# Patient Record
Sex: Male | Born: 1935
Health system: Southern US, Community
[De-identification: ages and names within clinical notes are randomized; demographics above are authoritative.]

## PROBLEM LIST (undated history)

## (undated) DIAGNOSIS — E119 Type 2 diabetes mellitus without complications: Secondary | ICD-10-CM

---

## 2016-04-05 ENCOUNTER — Emergency Department (HOSPITAL_COMMUNITY): Payer: Medicare Other

## 2016-04-05 ENCOUNTER — Emergency Department (HOSPITAL_COMMUNITY)
Admission: EM | Admit: 2016-04-05 | Discharge: 2016-04-05 | Disposition: A | Discharge status: Home / Self Care | Payer: Medicare Other | Attending: Emergency Medicine | Admitting: Emergency Medicine

## 2016-04-05 ENCOUNTER — Encounter (HOSPITAL_COMMUNITY): Payer: Self-pay | Admitting: Emergency Medicine

## 2016-04-05 DIAGNOSIS — Y929 Unspecified place or not applicable: Secondary | ICD-10-CM | POA: Diagnosis not present

## 2016-04-05 DIAGNOSIS — Y999 Unspecified external cause status: Secondary | ICD-10-CM | POA: Insufficient documentation

## 2016-04-05 DIAGNOSIS — X503XXA Overexertion from repetitive movements, initial encounter: Secondary | ICD-10-CM | POA: Insufficient documentation

## 2016-04-05 DIAGNOSIS — S161XXA Strain of muscle, fascia and tendon at neck level, initial encounter: Secondary | ICD-10-CM | POA: Diagnosis not present

## 2016-04-05 DIAGNOSIS — M40209 Unspecified kyphosis, site unspecified: Secondary | ICD-10-CM | POA: Diagnosis not present

## 2016-04-05 DIAGNOSIS — Y93H2 Activity, gardening and landscaping: Secondary | ICD-10-CM | POA: Diagnosis not present

## 2016-04-05 DIAGNOSIS — E119 Type 2 diabetes mellitus without complications: Secondary | ICD-10-CM | POA: Insufficient documentation

## 2016-04-05 DIAGNOSIS — S199XXA Unspecified injury of neck, initial encounter: Secondary | ICD-10-CM | POA: Diagnosis present

## 2016-04-05 HISTORY — DX: Type 2 diabetes mellitus without complications: E11.9

## 2016-04-05 LAB — CBG MONITORING, ED: GLUCOSE-CAPILLARY: 115 mg/dL — AB (ref 65–99)

## 2016-04-05 MED ORDER — NAPROXEN 500 MG PO TABS: 500.0000 mg | ORAL_TABLET | Freq: Two times a day (BID) | ORAL | Status: DC

## 2016-04-05 MED ORDER — IBUPROFEN 200 MG PO TABS
400.0000 mg | ORAL_TABLET | Freq: Once | ORAL | Status: AC | PRN
  Administered 2016-04-05: 400 mg via ORAL
  Filled 2016-04-05: qty 2

## 2016-04-05 MED ORDER — BUPIVACAINE HCL (PF) 0.5 % IJ SOLN
20.0000 mL | Freq: Once | INTRAMUSCULAR | Status: AC
  Administered 2016-04-05: 20 mL
  Filled 2016-04-05: qty 30

## 2016-04-05 NOTE — ED Notes (Signed)
Provider in room  

## 2016-04-05 NOTE — ED Notes (Addendum)
Neck pain x 10-12 days, non traumatic, started on the right side and has now moved to the left side. Unable to get into PCP. Has not taken any meds for this at home. Denies fever/vomiting

## 2016-04-05 NOTE — ED Notes (Signed)
Pt in ct 

## 2016-04-05 NOTE — ED Provider Notes (Signed)
CSN: [id number]     Arrival date & time 04/05/16  1453 History   First MD Initiated Contact with Patient 04/05/16 1906     Chief Complaint  Patient presents with  . Neck Pain     (Consider location/radiation/quality/duration/timing/severity/associated sxs/prior Treatment) Patient is a 82 y.o. male presenting with neck injury. The history is provided by the patient.  Neck Injury This is a new problem. The current episode started more than 1 week ago. The problem occurs constantly. The problem has not changed since onset.Associated symptoms include headaches. Pertinent negatives include no chest pain and no abdominal pain. Nothing aggravates the symptoms. Nothing relieves the symptoms. He has tried nothing for the symptoms.    Past Medical History  Diagnosis Date  . Diabetes mellitus without complication (HCC)    History reviewed. No pertinent past surgical history. History reviewed. No pertinent family history. Social History  Substance Use Topics  . Smoking status: None  . Smokeless tobacco: None  . Alcohol Use: None    Review of Systems  Cardiovascular: Negative for chest pain.  Gastrointestinal: Negative for abdominal pain.  Neurological: Positive for headaches.  All other systems reviewed and are negative.     Allergies  Review of patient's allergies indicates no known allergies.  Home Medications   Prior to Admission medications   Medication Sig Start Date End Date Taking? Authorizing Provider  naproxen (NAPROSYN) 500 MG tablet Take 1 tablet (500 mg total) by mouth 2 (two) times daily with a meal. 04/05/16   Lyndal Pulley, MD   BP 127/77 mmHg  Pulse 87  Temp(Src) 99.8 F (37.7 C) (Oral)  Resp 18  SpO2 97% Physical Exam  Constitutional: He is oriented to person, place, and time. He appears well-developed and well-nourished. No distress.  HENT:  Head: Normocephalic and atraumatic.  Eyes: Conjunctivae are normal.  Neck: Neck supple. No tracheal deviation  present.  Cardiovascular: Normal rate and regular rhythm.   Pulmonary/Chest: Effort normal. No respiratory distress.  Abdominal: Soft. He exhibits no distension.  Musculoskeletal:       Cervical back: He exhibits tenderness (over bilateral poraspinal musculature especially superiorly where spasm is evident).  Significant thoracic kyphosis  Neurological: He is alert and oriented to person, place, and time. He has normal strength. He displays no atrophy. No sensory deficit. He exhibits normal muscle tone. Coordination normal.  Skin: Skin is warm and dry.  Psychiatric: He has a normal mood and affect.    ED Course  Procedures (including critical care time)  Procedure Note: Trigger Point Injection for Myofascial pain  Performed by Dr. Clydene Pugh Indication: muscle/myofascial pain Muscle body and tendon sheath of the bilateral cervical paraspinal muscle(s) at the base of the skull were injected with 0.5% bupivacaine under sterile technique for release of muscle spasm/pain. Patient tolerated well with immediate improvement of symptoms and no immediate complications following procedure.  CPT Code:   1 or 2 muscle bodies: 20552  Labs Review Labs Reviewed  CBG MONITORING, ED - Abnormal; Notable for the following:    Glucose-Capillary 115 (*)    All other components within normal limits    Imaging Review Dg Cervical Spine Complete  04/05/2016  CLINICAL DATA:  82 year old with neck pain for 10 or 12 days. No acute injury. No fever. EXAM: CERVICAL SPINE - COMPLETE 4+ VIEW COMPARISON:  None. FINDINGS: The prevertebral soft tissues are normal. The bones are mildly demineralized. There is multilevel spondylosis with disc space loss, uncinate spurring and facet hypertrophy. There is a  mild degenerative anterolisthesis at C4-5, C5-6 and C6-7. No evidence of acute fracture or traumatic subluxation. Oblique views demonstrate no high-grade foraminal narrowing. IMPRESSION: Moderate multilevel spondylosis  with near anatomic alignment. No acute osseous findings. Electronically Signed   By: Carey BullocksWilliam  Veazey M.D.   On: 04/05/2016 19:50   I have personally reviewed and evaluated these images and lab results as part of my medical decision-making.   EKG Interpretation None      MDM   Final diagnoses:  Cervical muscle strain, initial encounter  Kyphosis, unspecified kyphosis type, unspecified spinal region    82 y.o. male presents with bilateral neck pain mostly at the base of his skull. He has significant clinical kyphosis and per family often carries things on his shoulders for gardening. Point tender over splenius capitus muscles which are palpably in spasm. Screening XR without bony involvement, appears to be muscular. No neurologic deficits. Trigger point injections applied with come relief, recommended short-term NSAID therapy and posture maintenance as definitive therapy. Plan to follow up with PCP as needed and return precautions discussed for worsening or new concerning symptoms.     Lyndal Pulleyaniel Norval Slaven, MD 04/06/16 [phone]

## 2016-04-05 NOTE — Discharge Instructions (Signed)
Cervical Strain and Sprain With Rehab  Cervical strain and sprain are injuries that commonly occur with "whiplash" injuries. Whiplash occurs when the neck is forcefully whipped backward or forward, such as during a motor vehicle accident or during contact sports. The muscles, ligaments, tendons, discs, and nerves of the neck are susceptible to injury when this occurs.  RISK FACTORS  Risk of having a whiplash injury increases if:  · Osteoarthritis of the spine.  · Situations that make head or neck accidents or trauma more likely.  · High-risk sports (football, rugby, wrestling, hockey, auto racing, gymnastics, diving, contact karate, or boxing).  · Poor strength and flexibility of the neck.  · Previous neck injury.  · Poor tackling technique.  · Improperly fitted or padded equipment.  SYMPTOMS   · Pain or stiffness in the front or back of neck or both.  · Symptoms may present immediately or up to 24 hours after injury.  · Dizziness, headache, nausea, and vomiting.  · Muscle spasm with soreness and stiffness in the neck.  · Tenderness and swelling at the injury site.  PREVENTION  · Learn and use proper technique (avoid tackling with the head, spearing, and head-butting; use proper falling techniques to avoid landing on the head).  · Warm up and stretch properly before activity.  · Maintain physical fitness:    Strength, flexibility, and endurance.    Cardiovascular fitness.  · Wear properly fitted and padded protective equipment, such as padded soft collars, for participation in contact sports.  PROGNOSIS   Recovery from cervical strain and sprain injuries is dependent on the extent of the injury. These injuries are usually curable in 1 week to 3 months with appropriate treatment.   RELATED COMPLICATIONS   · Temporary numbness and weakness may occur if the nerve roots are damaged, and this may persist until the nerve has completely healed.  · Chronic pain due to frequent recurrence of symptoms.  · Prolonged healing,  especially if activity is resumed too soon (before complete recovery).  TREATMENT   Treatment initially involves the use of ice and medication to help reduce pain and inflammation. It is also important to perform strengthening and stretching exercises and modify activities that worsen symptoms so the injury does not get worse. These exercises may be performed at home or with a therapist. For patients who experience severe symptoms, a soft, padded collar may be recommended to be worn around the neck.   Improving your posture may help reduce symptoms. Posture improvement includes pulling your chin and abdomen in while sitting or standing. If you are sitting, sit in a firm chair with your buttocks against the back of the chair. While sleeping, try replacing your pillow with a small towel rolled to 2 inches in diameter, or use a cervical pillow or soft cervical collar. Poor sleeping positions delay healing.   For patients with nerve root damage, which causes numbness or weakness, the use of a cervical traction apparatus may be recommended. Surgery is rarely necessary for these injuries. However, cervical strain and sprains that are present at birth (congenital) may require surgery.  MEDICATION   · If pain medication is necessary, nonsteroidal anti-inflammatory medications, such as aspirin and ibuprofen, or other minor pain relievers, such as acetaminophen, are often recommended.  · Do not take pain medication for 7 days before surgery.  · Prescription pain relievers may be given if deemed necessary by your caregiver. Use only as directed and only as much as you need.    HEAT AND COLD:   · Cold treatment (icing) relieves pain and reduces inflammation. Cold treatment should be applied for 10 to 15 minutes every 2 to 3 hours for inflammation and pain and immediately after any activity that aggravates your symptoms. Use ice packs or an ice massage.  · Heat treatment may be used prior to performing the stretching and  strengthening activities prescribed by your caregiver, physical therapist, or athletic trainer. Use a heat pack or a warm soak.  SEEK MEDICAL CARE IF:   · Symptoms get worse or do not improve in 2 weeks despite treatment.  · New, unexplained symptoms develop (drugs used in treatment may produce side effects).  EXERCISES  RANGE OF MOTION (ROM) AND STRETCHING EXERCISES - Cervical Strain and Sprain  These exercises may help you when beginning to rehabilitate your injury. In order to successfully resolve your symptoms, you must improve your posture. These exercises are designed to help reduce the forward-head and rounded-shoulder posture which contributes to this condition. Your symptoms may resolve with or without further involvement from your physician, physical therapist or athletic trainer. While completing these exercises, remember:   · Restoring tissue flexibility helps normal motion to return to the joints. This allows healthier, less painful movement and activity.  · An effective stretch should be held for at least 20 seconds, although you may need to begin with shorter hold times for comfort.  · A stretch should never be painful. You should only feel a gentle lengthening or release in the stretched tissue.  STRETCH- Axial Extensors  · Lie on your back on the floor. You may bend your knees for comfort. Place a rolled-up hand towel or dish towel, about 2 inches in diameter, under the part of your head that makes contact with the floor.  · Gently tuck your chin, as if trying to make a "double chin," until you feel a gentle stretch at the base of your head.  · Hold __________ seconds.  Repeat __________ times. Complete this exercise __________ times per day.   STRETCH - Axial Extension   · Stand or sit on a firm surface. Assume a good posture: chest up, shoulders drawn back, abdominal muscles slightly tense, knees unlocked (if standing) and feet hip width apart.  · Slowly retract your chin so your head slides back  and your chin slightly lowers. Continue to look straight ahead.  · You should feel a gentle stretch in the back of your head. Be certain not to feel an aggressive stretch since this can cause headaches later.  · Hold for __________ seconds.  Repeat __________ times. Complete this exercise __________ times per day.  STRETCH - Cervical Side Bend   · Stand or sit on a firm surface. Assume a good posture: chest up, shoulders drawn back, abdominal muscles slightly tense, knees unlocked (if standing) and feet hip width apart.  · Without letting your nose or shoulders move, slowly tip your right / left ear to your shoulder until your feel a gentle stretch in the muscles on the opposite side of your neck.  · Hold __________ seconds.  Repeat __________ times. Complete this exercise __________ times per day.  STRETCH - Cervical Rotators   · Stand or sit on a firm surface. Assume a good posture: chest up, shoulders drawn back, abdominal muscles slightly tense, knees unlocked (if standing) and feet hip width apart.  · Keeping your eyes level with the ground, slowly turn your head until you feel a gentle stretch along   the back and opposite side of your neck.  · Hold __________ seconds.  Repeat __________ times. Complete this exercise __________ times per day.  RANGE OF MOTION - Neck Circles   · Stand or sit on a firm surface. Assume a good posture: chest up, shoulders drawn back, abdominal muscles slightly tense, knees unlocked (if standing) and feet hip width apart.  · Gently roll your head down and around from the back of one shoulder to the back of the other. The motion should never be forced or painful.  · Repeat the motion 10-20 times, or until you feel the neck muscles relax and loosen.  Repeat __________ times. Complete the exercise __________ times per day.  STRENGTHENING EXERCISES - Cervical Strain and Sprain  These exercises may help you when beginning to rehabilitate your injury. They may resolve your symptoms with or  without further involvement from your physician, physical therapist, or athletic trainer. While completing these exercises, remember:   · Muscles can gain both the endurance and the strength needed for everyday activities through controlled exercises.  · Complete these exercises as instructed by your physician, physical therapist, or athletic trainer. Progress the resistance and repetitions only as guided.  · You may experience muscle soreness or fatigue, but the pain or discomfort you are trying to eliminate should never worsen during these exercises. If this pain does worsen, stop and make certain you are following the directions exactly. If the pain is still present after adjustments, discontinue the exercise until you can discuss the trouble with your clinician.  STRENGTH - Cervical Flexors, Isometric  · Face a wall, standing about 6 inches away. Place a small pillow, a ball about 6-8 inches in diameter, or a folded towel between your forehead and the wall.  · Slightly tuck your chin and gently push your forehead into the soft object. Push only with mild to moderate intensity, building up tension gradually. Keep your jaw and forehead relaxed.  · Hold 10 to 20 seconds. Keep your breathing relaxed.  · Release the tension slowly. Relax your neck muscles completely before you start the next repetition.  Repeat __________ times. Complete this exercise __________ times per day.  STRENGTH- Cervical Lateral Flexors, Isometric   · Stand about 6 inches away from a wall. Place a small pillow, a ball about 6-8 inches in diameter, or a folded towel between the side of your head and the wall.  · Slightly tuck your chin and gently tilt your head into the soft object. Push only with mild to moderate intensity, building up tension gradually. Keep your jaw and forehead relaxed.  · Hold 10 to 20 seconds. Keep your breathing relaxed.  · Release the tension slowly. Relax your neck muscles completely before you start the next  repetition.  Repeat __________ times. Complete this exercise __________ times per day.  STRENGTH - Cervical Extensors, Isometric   · Stand about 6 inches away from a wall. Place a small pillow, a ball about 6-8 inches in diameter, or a folded towel between the back of your head and the wall.  · Slightly tuck your chin and gently tilt your head back into the soft object. Push only with mild to moderate intensity, building up tension gradually. Keep your jaw and forehead relaxed.  · Hold 10 to 20 seconds. Keep your breathing relaxed.  · Release the tension slowly. Relax your neck muscles completely before you start the next repetition.  Repeat __________ times. Complete this exercise __________ times per day.    POSTURE AND BODY MECHANICS CONSIDERATIONS - Cervical Strain and Sprain  Keeping correct posture when sitting, standing or completing your activities will reduce the stress put on different body tissues, allowing injured tissues a chance to heal and limiting painful experiences. The following are general guidelines for improved posture. Your physician or physical therapist will provide you with any instructions specific to your needs. While reading these guidelines, remember:  · The exercises prescribed by your provider will help you have the flexibility and strength to maintain correct postures.  · The correct posture provides the optimal environment for your joints to work. All of your joints have less wear and tear when properly supported by a spine with good posture. This means you will experience a healthier, less painful body.  · Correct posture must be practiced with all of your activities, especially prolonged sitting and standing. Correct posture is as important when doing repetitive low-stress activities (typing) as it is when doing a single heavy-load activity (lifting).  PROLONGED STANDING WHILE SLIGHTLY LEANING FORWARD  When completing a task that requires you to lean forward while standing in one  place for a long time, place either foot up on a stationary 2- to 4-inch high object to help maintain the best posture. When both feet are on the ground, the low back tends to lose its slight inward curve. If this curve flattens (or becomes too large), then the back and your other joints will experience too much stress, fatigue more quickly, and can cause pain.   RESTING POSITIONS  Consider which positions are most painful for you when choosing a resting position. If you have pain with flexion-based activities (sitting, bending, stooping, squatting), choose a position that allows you to rest in a less flexed posture. You would want to avoid curling into a fetal position on your side. If your pain worsens with extension-based activities (prolonged standing, working overhead), avoid resting in an extended position such as sleeping on your stomach. Most people will find more comfort when they rest with their spine in a more neutral position, neither too rounded nor too arched. Lying on a non-sagging bed on your side with a pillow between your knees, or on your back with a pillow under your knees will often provide some relief. Keep in mind, being in any one position for a prolonged period of time, no matter how correct your posture, can still lead to stiffness.  WALKING  Walk with an upright posture. Your ears, shoulders, and hips should all line up.  OFFICE WORK  When working at a desk, create an environment that supports good, upright posture. Without extra support, muscles fatigue and lead to excessive strain on joints and other tissues.  CHAIR:  · A chair should be able to slide under your desk when your back makes contact with the back of the chair. This allows you to work closely.  · The chair's height should allow your eyes to be level with the upper part of your monitor and your hands to be slightly lower than your elbows.  · Body position:    Your feet should make contact with the floor. If this is not  possible, use a foot rest.    Keep your ears over your shoulders. This will reduce stress on your neck and low back.     This information is not intended to replace advice given to you by your health care provider. Make sure you discuss any questions you have with your health care provider.       Document Released: 11/29/2005 Document Revised: 12/20/2014 Document Reviewed: 03/13/2009  Elsevier Interactive Patient Education ©2016 [employer]

## 2020-08-27 DIAGNOSIS — Z0000 Encounter for general adult medical examination without abnormal findings: Secondary | ICD-10-CM | POA: Diagnosis not present

## 2020-08-27 DIAGNOSIS — E1165 Type 2 diabetes mellitus with hyperglycemia: Secondary | ICD-10-CM | POA: Diagnosis not present

## 2020-08-27 DIAGNOSIS — E11319 Type 2 diabetes mellitus with unspecified diabetic retinopathy without macular edema: Secondary | ICD-10-CM | POA: Diagnosis not present

## 2020-08-27 DIAGNOSIS — E1142 Type 2 diabetes mellitus with diabetic polyneuropathy: Secondary | ICD-10-CM | POA: Diagnosis not present

## 2020-08-27 DIAGNOSIS — E46 Unspecified protein-calorie malnutrition: Secondary | ICD-10-CM | POA: Diagnosis not present

## 2020-08-27 DIAGNOSIS — Z7984 Long term (current) use of oral hypoglycemic drugs: Secondary | ICD-10-CM | POA: Diagnosis not present

## 2020-08-27 DIAGNOSIS — Z1389 Encounter for screening for other disorder: Secondary | ICD-10-CM | POA: Diagnosis not present

## 2020-09-04 DIAGNOSIS — Z7984 Long term (current) use of oral hypoglycemic drugs: Secondary | ICD-10-CM | POA: Diagnosis not present

## 2020-09-04 DIAGNOSIS — E1142 Type 2 diabetes mellitus with diabetic polyneuropathy: Secondary | ICD-10-CM | POA: Diagnosis not present

## 2021-01-12 ENCOUNTER — Ambulatory Visit: Payer: Self-pay | Attending: Internal Medicine

## 2021-01-12 DIAGNOSIS — Z23 Encounter for immunization: Secondary | ICD-10-CM

## 2021-01-12 NOTE — Progress Notes (Signed)
   Covid-19 Vaccination Clinic  Name:  David Cervantes    MRN: [id number] DOB: 13-Sep-1927  01/12/2021  [name] was observed post Covid-19 immunization for 15 minutes without incident. He was provided with Vaccine Information Sheet and instruction to access the V-Safe system.   [name] was instructed to call 911 with any severe reactions post vaccine: Marland Kitchen Difficulty breathing  . Swelling of face and throat  . A fast heartbeat  . A bad rash all over body  . Dizziness and weakness   Immunizations Administered    Name Date Dose VIS Date Route   PFIZER Comrnaty(Gray TOP) Covid-19 Vaccine 01/12/2021  2:47 PM 0.3 mL 11/20/2020 Intramuscular   Manufacturer: [employer], [employer]   Lot: [id number]   NDC: [phone]

## 2021-01-14 DIAGNOSIS — H524 Presbyopia: Secondary | ICD-10-CM | POA: Diagnosis not present

## 2021-01-14 DIAGNOSIS — E119 Type 2 diabetes mellitus without complications: Secondary | ICD-10-CM | POA: Diagnosis not present

## 2021-01-14 DIAGNOSIS — H35373 Puckering of macula, bilateral: Secondary | ICD-10-CM | POA: Diagnosis not present

## 2021-01-14 DIAGNOSIS — H26491 Other secondary cataract, right eye: Secondary | ICD-10-CM | POA: Diagnosis not present

## 2021-03-03 DIAGNOSIS — E782 Mixed hyperlipidemia: Secondary | ICD-10-CM | POA: Diagnosis not present

## 2021-03-03 DIAGNOSIS — E113293 Type 2 diabetes mellitus with mild nonproliferative diabetic retinopathy without macular edema, bilateral: Secondary | ICD-10-CM | POA: Diagnosis not present

## 2021-03-03 DIAGNOSIS — E1142 Type 2 diabetes mellitus with diabetic polyneuropathy: Secondary | ICD-10-CM | POA: Diagnosis not present

## 2021-07-03 ENCOUNTER — Ambulatory Visit: Payer: Self-pay | Attending: Internal Medicine

## 2021-07-03 DIAGNOSIS — Z23 Encounter for immunization: Secondary | ICD-10-CM

## 2021-07-06 ENCOUNTER — Other Ambulatory Visit: Payer: Self-pay

## 2021-07-06 MED ORDER — PFIZER-BIONT COVID-19 VAC-TRIS 30 MCG/0.3ML IM SUSP
INTRAMUSCULAR | 0 refills | Status: DC
  Filled 2021-07-06: qty 0.3, 10d supply, fill #0

## 2021-09-07 DIAGNOSIS — L309 Dermatitis, unspecified: Secondary | ICD-10-CM | POA: Diagnosis not present

## 2021-09-07 DIAGNOSIS — Z0000 Encounter for general adult medical examination without abnormal findings: Secondary | ICD-10-CM | POA: Diagnosis not present

## 2021-09-07 DIAGNOSIS — E1165 Type 2 diabetes mellitus with hyperglycemia: Secondary | ICD-10-CM | POA: Diagnosis not present

## 2021-09-07 DIAGNOSIS — E46 Unspecified protein-calorie malnutrition: Secondary | ICD-10-CM | POA: Diagnosis not present

## 2021-09-07 DIAGNOSIS — E1142 Type 2 diabetes mellitus with diabetic polyneuropathy: Secondary | ICD-10-CM | POA: Diagnosis not present

## 2021-09-07 DIAGNOSIS — E113299 Type 2 diabetes mellitus with mild nonproliferative diabetic retinopathy without macular edema, unspecified eye: Secondary | ICD-10-CM | POA: Diagnosis not present

## 2021-09-07 DIAGNOSIS — Z794 Long term (current) use of insulin: Secondary | ICD-10-CM | POA: Diagnosis not present

## 2021-09-07 DIAGNOSIS — Z1389 Encounter for screening for other disorder: Secondary | ICD-10-CM | POA: Diagnosis not present

## 2021-09-07 DIAGNOSIS — E782 Mixed hyperlipidemia: Secondary | ICD-10-CM | POA: Diagnosis not present

## 2021-09-07 DIAGNOSIS — R001 Bradycardia, unspecified: Secondary | ICD-10-CM | POA: Diagnosis not present

## 2021-10-20 ENCOUNTER — Ambulatory Visit: Payer: Self-pay | Attending: Internal Medicine

## 2021-10-20 ENCOUNTER — Other Ambulatory Visit: Payer: Self-pay

## 2021-10-20 DIAGNOSIS — Z23 Encounter for immunization: Secondary | ICD-10-CM

## 2021-10-20 MED ORDER — PFIZER COVID-19 VAC BIVALENT 30 MCG/0.3ML IM SUSP
INTRAMUSCULAR | 0 refills | Status: DC
  Filled 2021-10-20: qty 0.3, 1d supply, fill #0

## 2021-10-20 NOTE — Progress Notes (Signed)
   Covid-19 Vaccination Clinic  Name:  Rayansh Herbst    MRN: [id number] DOB: [birth year]  10/20/2021  Mr. Dehoyos was observed post Covid-19 immunization for 15 minutes without incident. He was provided with Vaccine Information Sheet and instruction to access the V-Safe system.   Mr. Bledsoe was instructed to call 911 with any severe reactions post vaccine: Difficulty breathing  Swelling of face and throat  A fast heartbeat  A bad rash all over body  Dizziness and weakness   Immunizations Administered     Name Date Dose VIS Date Route   Pfizer Covid-19 Vaccine Bivalent Booster 10/20/2021  2:21 PM 0.3 mL 08/12/2021 Intramuscular   Manufacturer: [employer], [employer]   Lot: [id number]   NDC: [address]-3      Drusilla Kanner, PharmD, MBA Clinical Acute Care Pharmacist

## 2022-01-07 DIAGNOSIS — F0390 Unspecified dementia without behavioral disturbance: Secondary | ICD-10-CM | POA: Diagnosis not present

## 2022-01-07 DIAGNOSIS — E113293 Type 2 diabetes mellitus with mild nonproliferative diabetic retinopathy without macular edema, bilateral: Secondary | ICD-10-CM | POA: Diagnosis not present

## 2022-01-07 DIAGNOSIS — E46 Unspecified protein-calorie malnutrition: Secondary | ICD-10-CM | POA: Diagnosis not present

## 2022-01-07 DIAGNOSIS — E1165 Type 2 diabetes mellitus with hyperglycemia: Secondary | ICD-10-CM | POA: Diagnosis not present

## 2022-01-07 DIAGNOSIS — E1142 Type 2 diabetes mellitus with diabetic polyneuropathy: Secondary | ICD-10-CM | POA: Diagnosis not present

## 2022-01-18 DIAGNOSIS — H35373 Puckering of macula, bilateral: Secondary | ICD-10-CM | POA: Diagnosis not present

## 2022-01-18 DIAGNOSIS — H26491 Other secondary cataract, right eye: Secondary | ICD-10-CM | POA: Diagnosis not present

## 2022-01-18 DIAGNOSIS — E119 Type 2 diabetes mellitus without complications: Secondary | ICD-10-CM | POA: Diagnosis not present

## 2022-01-18 DIAGNOSIS — H524 Presbyopia: Secondary | ICD-10-CM | POA: Diagnosis not present

## 2022-03-25 ENCOUNTER — Other Ambulatory Visit: Payer: Self-pay

## 2022-03-25 ENCOUNTER — Emergency Department (HOSPITAL_COMMUNITY): Payer: Medicare PPO

## 2022-03-25 ENCOUNTER — Emergency Department (HOSPITAL_COMMUNITY)
Admission: EM | Admit: 2022-03-25 | Discharge: 2022-03-25 | Disposition: A | Discharge status: Home / Self Care | Payer: Medicare PPO | Attending: Emergency Medicine | Admitting: Emergency Medicine

## 2022-03-25 ENCOUNTER — Encounter (HOSPITAL_COMMUNITY): Payer: Self-pay

## 2022-03-25 DIAGNOSIS — R4182 Altered mental status, unspecified: Secondary | ICD-10-CM | POA: Insufficient documentation

## 2022-03-25 DIAGNOSIS — R531 Weakness: Secondary | ICD-10-CM | POA: Diagnosis not present

## 2022-03-25 DIAGNOSIS — Z7984 Long term (current) use of oral hypoglycemic drugs: Secondary | ICD-10-CM | POA: Diagnosis not present

## 2022-03-25 DIAGNOSIS — R001 Bradycardia, unspecified: Secondary | ICD-10-CM | POA: Diagnosis not present

## 2022-03-25 DIAGNOSIS — Z8616 Personal history of COVID-19: Secondary | ICD-10-CM | POA: Insufficient documentation

## 2022-03-25 DIAGNOSIS — E161 Other hypoglycemia: Secondary | ICD-10-CM | POA: Diagnosis not present

## 2022-03-25 DIAGNOSIS — E162 Hypoglycemia, unspecified: Secondary | ICD-10-CM | POA: Insufficient documentation

## 2022-03-25 DIAGNOSIS — J90 Pleural effusion, not elsewhere classified: Secondary | ICD-10-CM | POA: Diagnosis not present

## 2022-03-25 DIAGNOSIS — R0902 Hypoxemia: Secondary | ICD-10-CM | POA: Diagnosis not present

## 2022-03-25 DIAGNOSIS — E11649 Type 2 diabetes mellitus with hypoglycemia without coma: Secondary | ICD-10-CM | POA: Diagnosis not present

## 2022-03-25 DIAGNOSIS — I10 Essential (primary) hypertension: Secondary | ICD-10-CM | POA: Diagnosis not present

## 2022-03-25 LAB — CBG MONITORING, ED
Glucose-Capillary: 65 mg/dL — ABNORMAL LOW (ref 70–99)
Glucose-Capillary: 104 mg/dL — ABNORMAL HIGH (ref 70–99)
Glucose-Capillary: 186 mg/dL — ABNORMAL HIGH (ref 70–99)

## 2022-03-25 LAB — COMPREHENSIVE METABOLIC PANEL
ALT: 30 U/L (ref 0–44)
AST: 39 U/L (ref 15–41)
Albumin: 3.4 g/dL — ABNORMAL LOW (ref 3.5–5.0)
Alkaline Phosphatase: 112 U/L (ref 38–126)
Anion gap: 6 (ref 5–15)
BUN: 19 mg/dL (ref 8–23)
CO2: 26 mmol/L (ref 22–32)
Calcium: 8.7 mg/dL — ABNORMAL LOW (ref 8.9–10.3)
Chloride: 110 mmol/L (ref 98–111)
Creatinine, Ser: 0.9 mg/dL (ref 0.61–1.24)
GFR, Estimated: >60 mL/min (ref >60)
Glucose, Bld: 81 mg/dL (ref 70–99)
Potassium: 3.5 mmol/L (ref 3.5–5.1)
Sodium: 142 mmol/L (ref 135–145)
Total Bilirubin: 0.5 mg/dL (ref 0.3–1.2)
Total Protein: 6.7 g/dL (ref 6.5–8.1)

## 2022-03-25 LAB — CBC WITH DIFFERENTIAL/PLATELET
Abs Immature Granulocytes: 0.07 10*3/uL (ref 0.00–0.07)
Basophils Absolute: 0 10*3/uL (ref 0.0–0.1)
Basophils Relative: 0 %
Eosinophils Absolute: 0 10*3/uL (ref 0.0–0.5)
Eosinophils Relative: 0 %
HCT: 35.7 % — ABNORMAL LOW (ref 39.0–52.0)
Hemoglobin: 12.1 g/dL — ABNORMAL LOW (ref 13.0–17.0)
Immature Granulocytes: 1 %
Lymphocytes Relative: 5 %
Lymphs Abs: 0.7 10*3/uL (ref 0.7–4.0)
MCH: 31.8 pg (ref 26.0–34.0)
MCHC: 33.9 g/dL (ref 30.0–36.0)
MCV: 93.7 fL (ref 80.0–100.0)
Monocytes Absolute: 0.9 10*3/uL (ref 0.1–1.0)
Monocytes Relative: 7 %
Neutro Abs: 11.1 10*3/uL — ABNORMAL HIGH (ref 1.7–7.7)
Neutrophils Relative %: 87 %
Platelets: 282 10*3/uL (ref 150–400)
RBC: 3.81 MIL/uL — ABNORMAL LOW (ref 4.22–5.81)
RDW: 13.5 % (ref 11.5–15.5)
WBC: 12.8 10*3/uL — ABNORMAL HIGH (ref 4.0–10.5)
nRBC: 0 % (ref 0.0–0.2)

## 2022-03-25 LAB — URINALYSIS, ROUTINE W REFLEX MICROSCOPIC
Bacteria, UA: NONE SEEN
Bilirubin Urine: NEGATIVE
Glucose, UA: NEGATIVE mg/dL
Ketones, ur: NEGATIVE mg/dL
Leukocytes,Ua: NEGATIVE
Nitrite: NEGATIVE
Protein, ur: 100 mg/dL — AB
Specific Gravity, Urine: 1.01 (ref 1.005–1.030)
pH: 5 (ref 5.0–8.0)

## 2022-03-25 NOTE — ED Notes (Signed)
Discharge instructions reviewed, questions answered. Pt and family at bedside state understanding and no further questions. Pt wheeled to ride upon discharge. No s/s of distress noted. ? ?

## 2022-03-25 NOTE — Discharge Instructions (Signed)
Try to eat and drink regularly.  Take your medicine as directed.  Follow-up with Dr. Eula Listen as soon as possible ?

## 2022-03-25 NOTE — ED Triage Notes (Signed)
Pt presents from home via EMS with c/o hypoglycemia. Pt's CBG was 55 upon EMS arrival, back up to 82 after oral glucose and orange juice given by EMS. Pt lives alone, did have a bowel movement on himself prior to arrival to the ED. Pt is alert and oriented x 4 per EMS.  ?

## 2022-03-25 NOTE — ED Notes (Signed)
Pt provided ham sandwich, banana and 8 oz OJ and water. Pt aware urine specimen is still needed. ?

## 2022-03-25 NOTE — ED Notes (Signed)
Pt currently awake and alert, talking with daughter. ?

## 2022-03-25 NOTE — ED Notes (Signed)
Pt had incontinence episode of bowel and bladder. Complete bed change and gown exchanged.  Brief placed on pt.  Warm blanket given. Pt tolerated well. ?

## 2022-03-25 NOTE — ED Notes (Signed)
Unsuccessful IV attempt in R and L forearm, 20g. ?

## 2022-03-25 NOTE — ED Notes (Signed)
Pt ambulatory with assistance to bathroom for BM. Pt provided urinal to try and provide urine specimen but was unable to.  ?

## 2022-03-25 NOTE — ED Provider Notes (Signed)
?[facility]-EMERGENCY DEPT ?Provider Note ? ? ?CSN: [id number] ?Arrival date & time: 03/25/22  1700 ? ?  ? ?History ? ?Chief Complaint  ?Patient presents with  ? Hypoglycemia  ? ? ?David Cervantes is a 88 y.o. male. ? ?HPI ?Patient here to be evaluated for fall and hypoglycemia.  His daughter was trying to get in touch with him today, when she was unable to, she called a neighbor who went into the patient's home and found him on the floor in his bathroom.  Apparently he laid there for several hours after going into the bathroom early this morning to start his morning routine.  Patient lives alone.  He has had some increasing difficulty with memory, but is typically able to care for himself there.  He follows his blood sugar at home.  He apparently takes his usual medications, as scheduled.  His daughter called EMS after she got to his home, because of the incident.  She went to make sure that "no bones are broken."  At this time the patient's daughter states that he is at his baseline. ?  ?  ? ?Home Medications ?Prior to Admission medications   ?Medication Sig Start Date End Date Taking? Authorizing Provider  ?LANTUS SOLOSTAR 100 UNIT/ML Solostar Pen Inject 21 Units into the skin daily. 01/21/22  Yes [provider]  ?metFORMIN (GLUCOPHAGE-XR) 500 MG 24 hr tablet Take 500 mg by mouth 2 (two) times daily. 01/05/22  Yes [provider]  ?ramipril (ALTACE) 2.5 MG capsule Take 2.5 mg by mouth daily. 01/05/22  Yes [provider]  ?COVID-19 mRNA bivalent vaccine, Pfizer, (PFIZER COVID-19 VAC BIVALENT) injection Inject into the muscle. 10/20/21   Judyann Munson, MD  ?COVID-19 mRNA Vac-TriS, Pfizer, (PFIZER-BIONT COVID-19 VAC-TRIS) SUSP injection Inject into the muscle. 07/03/21   Judyann Munson, MD  ?   ? ?Allergies    ?Patient has no known allergies.   ? ?Review of Systems   ?Review of Systems ? ?Physical Exam ?Updated Vital Signs ?BP (!) 153/53   Pulse (!) 42   Temp (!)  97.4 ?F (36.3 ?C) (Oral)   Resp 16   SpO2 99%  ?Physical Exam ?Vitals and nursing note reviewed.  ?Constitutional:   ?   General: He is not in acute distress. ?   Appearance: He is well-developed. He is not ill-appearing or diaphoretic.  ?   Comments: Elderly, frail  ?HENT:  ?   Head: Normocephalic and atraumatic.  ?   Right Ear: External ear normal.  ?   Left Ear: External ear normal.  ?Eyes:  ?   Conjunctiva/sclera: Conjunctivae normal.  ?   Pupils: Pupils are equal, round, and reactive to light.  ?Neck:  ?   Trachea: Phonation normal.  ?Cardiovascular:  ?   Rate and Rhythm: Normal rate and regular rhythm.  ?   Heart sounds: Normal heart sounds.  ?Pulmonary:  ?   Effort: Pulmonary effort is normal.  ?   Breath sounds: Normal breath sounds.  ?Abdominal:  ?   General: There is no distension.  ?   Palpations: Abdomen is soft.  ?   Tenderness: There is no abdominal tenderness.  ?Musculoskeletal:     ?   General: Normal range of motion.  ?   Cervical back: Normal range of motion and neck supple.  ?Skin: ?   General: Skin is warm and dry.  ?Neurological:  ?   Mental Status: He is alert and oriented to person, place, and  time.  ?   Cranial Nerves: No cranial nerve deficit.  ?   Sensory: No sensory deficit.  ?   Motor: No abnormal muscle tone.  ?   Coordination: Coordination normal.  ?   Comments: No dysarthria or aphasia.  He has mild memory loss and exhibits leg to recall things but has fair recall for events of day.  ?Psychiatric:     ?   Mood and Affect: Mood normal.     ?   Behavior: Behavior normal.  ? ? ?ED Results / Procedures / Treatments   ?Labs ?(all labs ordered are listed, but only abnormal results are displayed) ?Labs Reviewed  ?COMPREHENSIVE METABOLIC PANEL - Abnormal; Notable for the following components:  ?    Result Value  ? Calcium 8.7 (*)   ? Albumin 3.4 (*)   ? All other components within normal limits  ?CBC WITH DIFFERENTIAL/PLATELET - Abnormal; Notable for the following components:  ? WBC 12.8 (*)    ? RBC 3.81 (*)   ? Hemoglobin 12.1 (*)   ? HCT 35.7 (*)   ? Neutro Abs 11.1 (*)   ? All other components within normal limits  ?URINALYSIS, ROUTINE W REFLEX MICROSCOPIC - Abnormal; Notable for the following components:  ? Hgb urine dipstick MODERATE (*)   ? Protein, ur 100 (*)   ? All other components within normal limits  ?CBG MONITORING, ED - Abnormal; Notable for the following components:  ? Glucose-Capillary 65 (*)   ? All other components within normal limits  ?CBG MONITORING, ED - Abnormal; Notable for the following components:  ? Glucose-Capillary 104 (*)   ? All other components within normal limits  ?CBG MONITORING, ED - Abnormal; Notable for the following components:  ? Glucose-Capillary 186 (*)   ? All other components within normal limits  ? ? ?EKG ?EKG Interpretation ? ?Date/Time:  Thursday March 25 2022 18:06:02 EDT ?Ventricular Rate:  63 ?PR Interval:    ?QRS Duration: 96 ?QT Interval:  457 ?QTC Calculation: 438 ?R Axis:   -15 ?Text Interpretation: Poor data quality Confirmed by Gerhard MunchLockwood, Robert [id number]) on 03/26/2022 12:22:45 PM ? ?Radiology ?DG Chest 2 View ? ?Result Date: 03/25/2022 ?CLINICAL DATA:  Weakness EXAM: CHEST - 2 VIEW COMPARISON:  None. FINDINGS: Shallow duration with low lung volumes. Small bilateral pleural effusions with bibasilar atelectasis/scarring. Interstitial prominence. Cardiomediastinal contours are within normal limits. Age-indeterminate thoracic spine compression fractures. IMPRESSION: Small bilateral pleural effusions with bibasilar atelectasis/scarring. Age-indeterminate interstitial prominence may be chronic or reflect edema. Electronically Signed   By: Guadlupe SpanishPraneil  Patel M.D.   On: 03/25/2022 19:37  ? ?CT Head Wo Contrast ? ?Result Date: 03/25/2022 ?CLINICAL DATA:  Mental status change, unknown cause EXAM: CT HEAD WITHOUT CONTRAST TECHNIQUE: Contiguous axial images were obtained from the base of the skull through the vertex without intravenous contrast. RADIATION DOSE  REDUCTION: This exam was performed according to the departmental dose-optimization program which includes automated exposure control, adjustment of the mA and/or kV according to patient size and/or use of iterative reconstruction technique. COMPARISON:  None. FINDINGS: Brain: No evidence of acute infarction, hemorrhage, hydrocephalus, extra-axial collection or mass lesion/mass effect. Generalized cerebral atrophy. Chronic microvascular ischemic disease. Nonspecific small calcifications in the pons, probably the sequela of prior insult. Vascular: Calcific intracranial atherosclerosis. Skull: No acute fracture. Sinuses/Orbits: Clear sinuses.  No acute orbital findings. Other: No mastoid effusions. IMPRESSION: 1. No evidence of acute intracranial abnormality. 2.  Cerebral atrophy (ICD10-G31.9). 3. Chronic microvascular ischemic disease. Electronically Signed  By: Feliberto Harts M.D.   On: 03/25/2022 18:45   ? ?Procedures ?Procedures  ? ? ?Medications Ordered in ED ?Medications - No data to display ? ?ED Course/ Medical Decision Making/ A&P ?  ?                        ?Medical Decision Making ?Patient found down in his bathroom after beginning his usual morning routine.  Duration of downtime unknown but may have been about 4 hours.  He was hypoglycemic.  Falling is not unusual for him.  He manages himself and lives alone.  His daughter looks in on him closely.  No recent illnesses ? ?Problems Addressed: ?Hypoglycemia: acute illness or injury ?   Details: Has had several episodes previously.  He manages his own medicines. ? ?Amount and/or Complexity of Data Reviewed ?Independent Historian: caregiver ?   Details: Daughter gives most history, patient gives some. ?Labs: ordered. ?   Details: CBC, metabolic panel-normal except calcium high, albumin low, white count high, hemoglobin low, glucose low at 1.65. ?Radiology: ordered and independent interpretation performed. ?   Details: CT head-no acute interval  changes. ?Discussion of management or test interpretation with external provider(s): Cardiac monitor-normal sinus rhythm ? ?Risk ?Decision regarding hospitalization. ?Risk Details: Patient with fall and hypoglycemia.  He is debi

## 2022-03-25 NOTE — ED Notes (Signed)
Pt provided oral fluids, orange juice due to noted blood sugar reading. ?

## 2022-03-25 NOTE — ED Provider Triage Note (Signed)
Emergency Medicine Provider Triage Evaluation Note ? ?David Cervantes , a 88 y.o. male  was evaluated in triage.  Pt complains of no complaint.  pleasantly confused, when asked why he is at the emergency department he states he does not know.  He does not remember calling EMS, states he lives in [place]  He lives independently, EMS reports was hypoglycemic on scene they gave him glucose and is improved to the 80s.  Patient denies any chest pain, shortness of breath, headaches, fevers.  He states he feels dizziness from time to time.. ? ?Review of Systems  ?PER HPI ? ?Physical Exam  ?BP (!) 155/67   Pulse (!) 55   Temp (!) 97.4 ?F (36.3 ?C) (Oral)   Resp 18   SpO2 96%  ?Gen:   Awake, no distress   ?Resp:  Normal effort  ?MSK:   Moves extremities without difficulty  ?Other:  Patient is bradycardic, cranial nerves III through XII are grossly intact.  He follows commands, he is confused. ? ?Medical Decision Making  ?Medically screening exam initiated at 5:38 PM.  Appropriate orders placed.  Brenen Dmarcus Decicco was informed that the remainder of the evaluation will be completed by another provider, this initial triage assessment does not replace that evaluation, and the importance of remaining in the ED until their evaluation is complete. ? ?Attempted to reach patient's emergency contact Ms. Pamelia Hoit, voicemail left. ?  ?Theron Arista, PA-C ?03/25/22 1740 ? ?

## 2022-04-20 ENCOUNTER — Inpatient Hospital Stay (HOSPITAL_COMMUNITY)
Admission: EM | Admit: 2022-04-20 | Discharge: 2022-04-25 | DRG: 189 | Disposition: A | Discharge status: Home Health | Payer: Medicare PPO | Attending: Internal Medicine | Admitting: Internal Medicine

## 2022-04-20 ENCOUNTER — Emergency Department (HOSPITAL_COMMUNITY): Payer: Medicare PPO

## 2022-04-20 ENCOUNTER — Encounter (HOSPITAL_COMMUNITY): Payer: Self-pay | Admitting: Pharmacy Technician

## 2022-04-20 DIAGNOSIS — Z20822 Contact with and (suspected) exposure to covid-19: Secondary | ICD-10-CM | POA: Diagnosis present

## 2022-04-20 DIAGNOSIS — I10 Essential (primary) hypertension: Secondary | ICD-10-CM

## 2022-04-20 DIAGNOSIS — E876 Hypokalemia: Secondary | ICD-10-CM | POA: Diagnosis present

## 2022-04-20 DIAGNOSIS — J189 Pneumonia, unspecified organism: Secondary | ICD-10-CM | POA: Diagnosis not present

## 2022-04-20 DIAGNOSIS — E0800 Diabetes mellitus due to underlying condition with hyperosmolarity without nonketotic hyperglycemic-hyperosmolar coma (NKHHC): Secondary | ICD-10-CM | POA: Diagnosis not present

## 2022-04-20 DIAGNOSIS — Z79899 Other long term (current) drug therapy: Secondary | ICD-10-CM

## 2022-04-20 DIAGNOSIS — R0609 Other forms of dyspnea: Secondary | ICD-10-CM | POA: Diagnosis not present

## 2022-04-20 DIAGNOSIS — Z794 Long term (current) use of insulin: Secondary | ICD-10-CM

## 2022-04-20 DIAGNOSIS — E785 Hyperlipidemia, unspecified: Secondary | ICD-10-CM | POA: Diagnosis present

## 2022-04-20 DIAGNOSIS — I272 Pulmonary hypertension, unspecified: Secondary | ICD-10-CM | POA: Diagnosis present

## 2022-04-20 DIAGNOSIS — E860 Dehydration: Secondary | ICD-10-CM | POA: Diagnosis not present

## 2022-04-20 DIAGNOSIS — N179 Acute kidney failure, unspecified: Secondary | ICD-10-CM | POA: Diagnosis present

## 2022-04-20 DIAGNOSIS — E782 Mixed hyperlipidemia: Secondary | ICD-10-CM | POA: Insufficient documentation

## 2022-04-20 DIAGNOSIS — J122 Parainfluenza virus pneumonia: Secondary | ICD-10-CM | POA: Diagnosis not present

## 2022-04-20 DIAGNOSIS — R0600 Dyspnea, unspecified: Secondary | ICD-10-CM | POA: Diagnosis not present

## 2022-04-20 DIAGNOSIS — R6889 Other general symptoms and signs: Secondary | ICD-10-CM | POA: Diagnosis not present

## 2022-04-20 DIAGNOSIS — Z743 Need for continuous supervision: Secondary | ICD-10-CM | POA: Diagnosis not present

## 2022-04-20 DIAGNOSIS — I509 Heart failure, unspecified: Secondary | ICD-10-CM

## 2022-04-20 DIAGNOSIS — I110 Hypertensive heart disease with heart failure: Secondary | ICD-10-CM | POA: Diagnosis present

## 2022-04-20 DIAGNOSIS — I5031 Acute diastolic (congestive) heart failure: Secondary | ICD-10-CM | POA: Diagnosis present

## 2022-04-20 DIAGNOSIS — F0390 Unspecified dementia without behavioral disturbance: Secondary | ICD-10-CM | POA: Insufficient documentation

## 2022-04-20 DIAGNOSIS — E119 Type 2 diabetes mellitus without complications: Secondary | ICD-10-CM

## 2022-04-20 DIAGNOSIS — R509 Fever, unspecified: Secondary | ICD-10-CM | POA: Diagnosis not present

## 2022-04-20 DIAGNOSIS — E1165 Type 2 diabetes mellitus with hyperglycemia: Secondary | ICD-10-CM | POA: Diagnosis not present

## 2022-04-20 DIAGNOSIS — J129 Viral pneumonia, unspecified: Secondary | ICD-10-CM | POA: Diagnosis not present

## 2022-04-20 DIAGNOSIS — R739 Hyperglycemia, unspecified: Secondary | ICD-10-CM | POA: Diagnosis not present

## 2022-04-20 DIAGNOSIS — R059 Cough, unspecified: Secondary | ICD-10-CM | POA: Diagnosis not present

## 2022-04-20 DIAGNOSIS — J811 Chronic pulmonary edema: Secondary | ICD-10-CM | POA: Diagnosis not present

## 2022-04-20 DIAGNOSIS — J9601 Acute respiratory failure with hypoxia: Secondary | ICD-10-CM | POA: Diagnosis present

## 2022-04-20 DIAGNOSIS — J90 Pleural effusion, not elsewhere classified: Secondary | ICD-10-CM | POA: Diagnosis not present

## 2022-04-20 LAB — CBC WITH DIFFERENTIAL/PLATELET
Abs Immature Granulocytes: 0.05 10*3/uL (ref 0.00–0.07)
Basophils Absolute: 0 10*3/uL (ref 0.0–0.1)
Basophils Relative: 0 %
Eosinophils Absolute: 0 10*3/uL (ref 0.0–0.5)
Eosinophils Relative: 0 %
HCT: 33.8 % — ABNORMAL LOW (ref 39.0–52.0)
Hemoglobin: 11.3 g/dL — ABNORMAL LOW (ref 13.0–17.0)
Immature Granulocytes: 1 %
Lymphocytes Relative: 4 %
Lymphs Abs: 0.4 10*3/uL — ABNORMAL LOW (ref 0.7–4.0)
MCH: 31.6 pg (ref 26.0–34.0)
MCHC: 33.4 g/dL (ref 30.0–36.0)
MCV: 94.4 fL (ref 80.0–100.0)
Monocytes Absolute: 0.6 10*3/uL (ref 0.1–1.0)
Monocytes Relative: 6 %
Neutro Abs: 8.5 10*3/uL — ABNORMAL HIGH (ref 1.7–7.7)
Neutrophils Relative %: 89 %
Platelets: 270 10*3/uL (ref 150–400)
RBC: 3.58 MIL/uL — ABNORMAL LOW (ref 4.22–5.81)
RDW: 13.7 % (ref 11.5–15.5)
WBC: 9.5 10*3/uL (ref 4.0–10.5)
nRBC: 0 % (ref 0.0–0.2)

## 2022-04-20 LAB — COMPREHENSIVE METABOLIC PANEL
ALT: 24 U/L (ref 0–44)
AST: 28 U/L (ref 15–41)
Albumin: 2.6 g/dL — ABNORMAL LOW (ref 3.5–5.0)
Alkaline Phosphatase: 94 U/L (ref 38–126)
Anion gap: 9 (ref 5–15)
BUN: 19 mg/dL (ref 8–23)
CO2: 23 mmol/L (ref 22–32)
Calcium: 7.6 mg/dL — ABNORMAL LOW (ref 8.9–10.3)
Chloride: 103 mmol/L (ref 98–111)
Creatinine, Ser: 1 mg/dL (ref 0.61–1.24)
GFR, Estimated: >60 mL/min (ref >60)
Glucose, Bld: 300 mg/dL — ABNORMAL HIGH (ref 70–99)
Potassium: 3.5 mmol/L (ref 3.5–5.1)
Sodium: 135 mmol/L (ref 135–145)
Total Bilirubin: 0.7 mg/dL (ref 0.3–1.2)
Total Protein: 5.9 g/dL — ABNORMAL LOW (ref 6.5–8.1)

## 2022-04-20 LAB — RESP PANEL BY RT-PCR (FLU A&B, COVID) ARPGX2
Influenza A by PCR: NEGATIVE
Influenza B by PCR: NEGATIVE
SARS Coronavirus 2 by RT PCR: NEGATIVE

## 2022-04-20 LAB — BRAIN NATRIURETIC PEPTIDE: B Natriuretic Peptide: 511.2 pg/mL — ABNORMAL HIGH (ref 0.0–100.0)

## 2022-04-20 LAB — CBG MONITORING, ED: Glucose-Capillary: 304 mg/dL — ABNORMAL HIGH (ref 70–99)

## 2022-04-20 LAB — MAGNESIUM: Magnesium: 1.6 mg/dL — ABNORMAL LOW (ref 1.7–2.4)

## 2022-04-20 LAB — PROCALCITONIN: Procalcitonin: 1.06 ng/mL

## 2022-04-20 MED ORDER — FUROSEMIDE 10 MG/ML IJ SOLN
20.0000 mg | Freq: Two times a day (BID) | INTRAMUSCULAR | Status: DC
  Administered 2022-04-21 – 2022-04-23 (×6): 20 mg via INTRAVENOUS
  Filled 2022-04-20 (×5): qty 2
  Filled 2022-04-20: qty 4

## 2022-04-20 MED ORDER — SODIUM CHLORIDE 0.9 % IV SOLN
2.0000 g | Freq: Once | INTRAVENOUS | Status: AC
  Administered 2022-04-20: 2 g via INTRAVENOUS
  Filled 2022-04-20: qty 20

## 2022-04-20 MED ORDER — FUROSEMIDE 10 MG/ML IJ SOLN
20.0000 mg | Freq: Once | INTRAMUSCULAR | Status: AC
  Administered 2022-04-20: 20 mg via INTRAVENOUS
  Filled 2022-04-20: qty 4

## 2022-04-20 MED ORDER — RAMIPRIL 2.5 MG PO CAPS
2.5000 mg | ORAL_CAPSULE | Freq: Every day | ORAL | Status: DC
  Administered 2022-04-21 – 2022-04-25 (×5): 2.5 mg via ORAL
  Filled 2022-04-20 (×5): qty 1

## 2022-04-20 MED ORDER — POLYETHYLENE GLYCOL 3350 17 G PO PACK: 17.0000 g | PACK | Freq: Every day | ORAL | Status: DC | PRN

## 2022-04-20 MED ORDER — INSULIN GLARGINE-YFGN 100 UNIT/ML ~~LOC~~ SOLN
10.0000 [IU] | Freq: Every day | SUBCUTANEOUS | Status: DC
  Administered 2022-04-20 – 2022-04-21 (×2): 10 [IU] via SUBCUTANEOUS
  Filled 2022-04-20 (×3): qty 0.1

## 2022-04-20 MED ORDER — SODIUM CHLORIDE 0.9% FLUSH
3.0000 mL | Freq: Two times a day (BID) | INTRAVENOUS | Status: DC
  Administered 2022-04-21 – 2022-04-24 (×7): 3 mL via INTRAVENOUS

## 2022-04-20 MED ORDER — ACETAMINOPHEN 325 MG PO TABS
650.0000 mg | ORAL_TABLET | Freq: Four times a day (QID) | ORAL | Status: DC | PRN
  Administered 2022-04-22: 650 mg via ORAL
  Filled 2022-04-20: qty 2

## 2022-04-20 MED ORDER — ACETAMINOPHEN 650 MG RE SUPP: 650.0000 mg | Freq: Four times a day (QID) | RECTAL | Status: DC | PRN

## 2022-04-20 MED ORDER — ENOXAPARIN SODIUM 40 MG/0.4ML IJ SOSY
40.0000 mg | PREFILLED_SYRINGE | INTRAMUSCULAR | Status: DC
  Administered 2022-04-20 – 2022-04-22 (×3): 40 mg via SUBCUTANEOUS
  Filled 2022-04-20 (×3): qty 0.4

## 2022-04-20 MED ORDER — ACETAMINOPHEN 500 MG PO TABS
1000.0000 mg | ORAL_TABLET | Freq: Once | ORAL | Status: AC
  Administered 2022-04-20: 1000 mg via ORAL
  Filled 2022-04-20: qty 2

## 2022-04-20 MED ORDER — INSULIN ASPART 100 UNIT/ML IJ SOLN
0.0000 [IU] | Freq: Three times a day (TID) | INTRAMUSCULAR | Status: DC
  Administered 2022-04-21: 2 [IU] via SUBCUTANEOUS
  Administered 2022-04-22: 7 [IU] via SUBCUTANEOUS
  Administered 2022-04-22: 5 [IU] via SUBCUTANEOUS
  Filled 2022-04-20: qty 0.09

## 2022-04-20 MED ORDER — SODIUM CHLORIDE 0.9 % IV BOLUS
500.0000 mL | Freq: Once | INTRAVENOUS | Status: AC
  Administered 2022-04-20: 500 mL via INTRAVENOUS

## 2022-04-20 MED ORDER — AZITHROMYCIN 500 MG IV SOLR
500.0000 mg | Freq: Once | INTRAVENOUS | Status: AC
  Administered 2022-04-20: 500 mg via INTRAVENOUS
  Filled 2022-04-20: qty 5

## 2022-04-20 NOTE — ED Triage Notes (Signed)
Pt bib ems from home with complaints of malaise, low grade fever, cough, runny nose. Pt with emesis X1 en route to ED, states he has motion sickness. Pt in NAD upon arrival.  ?

## 2022-04-20 NOTE — ED Notes (Signed)
Cbg 304 ? ?

## 2022-04-20 NOTE — ED Notes (Signed)
Pt is asleep and resting comfortably at this time ? ?

## 2022-04-20 NOTE — ED Notes (Signed)
Pt maintaining sats at 96% on 4L.titrated down to 3L will continue to monitor ?

## 2022-04-20 NOTE — ED Notes (Signed)
Pt placed on male pure wick. Pt maintaining sats above 96%. This nurse trended o2 down to 4L and will continue to monitor. Pt current stats maintaining between 93-94%.  ?

## 2022-04-20 NOTE — ED Provider Notes (Signed)
?[facility]-EMERGENCY DEPT ?Provider Note ? ? ?CSN: [id number] ?Arrival date & time: 04/20/22  1643 ? ?  ? ?History ? ?Chief Complaint  ?Patient presents with  ? Cough  ? Flu Like Symptoms  ? ? ?David Cervantes is a 88 y.o. male. ? ?88 yo M with chief complaints of cough sputum production fevers chills.  The patient is unable to tell me how long has been going on.  He tells me it depends on what he eats.  Denies any difficulty breathing.  Denies chest pain. ? ? ?Cough ? ?  ? ?Home Medications ?Prior to Admission medications   ?Medication Sig Start Date End Date Taking? Authorizing Provider  ?COVID-19 mRNA bivalent vaccine, Pfizer, (PFIZER COVID-19 VAC BIVALENT) injection Inject into the muscle. 10/20/21   Judyann Munson, MD  ?COVID-19 mRNA Vac-TriS, Pfizer, (PFIZER-BIONT COVID-19 VAC-TRIS) SUSP injection Inject into the muscle. 07/03/21   Judyann Munson, MD  ?LANTUS SOLOSTAR 100 UNIT/ML Solostar Pen Inject 21 Units into the skin daily. 01/21/22   [provider]  ?metFORMIN (GLUCOPHAGE-XR) 500 MG 24 hr tablet Take 500 mg by mouth 2 (two) times daily. 01/05/22   [provider]  ?ramipril (ALTACE) 2.5 MG capsule Take 2.5 mg by mouth daily. 01/05/22   [provider]  ?   ? ?Allergies    ?Patient has no known allergies.   ? ?Review of Systems   ?Review of Systems  ?Respiratory:  Positive for cough.   ? ?Physical Exam ?Updated Vital Signs ?BP 140/62   Pulse 63   Temp (!) 102.4 ?F (39.1 ?C)   Resp (!) 34   SpO2 91%  ?Physical Exam ?Vitals and nursing note reviewed.  ?Constitutional:   ?   Appearance: He is well-developed.  ?HENT:  ?   Head: Normocephalic and atraumatic.  ?Eyes:  ?   Pupils: Pupils are equal, round, and reactive to light.  ?Neck:  ?   Vascular: No JVD.  ?Cardiovascular:  ?   Rate and Rhythm: Normal rate and regular rhythm.  ?   Heart sounds: No murmur heard. ?  No friction rub. No gallop.  ?Pulmonary:  ?   Effort: No respiratory distress.  ?   Breath  sounds: No wheezing.  ?   Comments: No tachypnea.  Difficult to get the patient to take a deep breath for me which limits exam. ?Abdominal:  ?   General: There is no distension.  ?   Tenderness: There is no abdominal tenderness. There is no guarding or rebound.  ?Musculoskeletal:     ?   General: Normal range of motion.  ?   Cervical back: Normal range of motion and neck supple.  ?   Right lower leg: Edema present.  ?   Left lower leg: Edema present.  ?   Comments: 3+ edema to the bilateral lower extremities up to the shins.  ?Skin: ?   Coloration: Skin is not pale.  ?   Findings: No rash.  ?Neurological:  ?   Mental Status: He is alert and oriented to person, place, and time.  ?Psychiatric:     ?   Behavior: Behavior normal.  ? ? ?ED Results / Procedures / Treatments   ?Labs ?(all labs ordered are listed, but only abnormal results are displayed) ?Labs Reviewed  ?CBC WITH DIFFERENTIAL/PLATELET - Abnormal; Notable for the following components:  ?    Result Value  ? RBC 3.58 (*)   ? Hemoglobin 11.3 (*)   ? HCT  33.8 (*)   ? Neutro Abs 8.5 (*)   ? Lymphs Abs 0.4 (*)   ? All other components within normal limits  ?COMPREHENSIVE METABOLIC PANEL - Abnormal; Notable for the following components:  ? Glucose, Bld 300 (*)   ? Calcium 7.6 (*)   ? Total Protein 5.9 (*)   ? Albumin 2.6 (*)   ? All other components within normal limits  ?BRAIN NATRIURETIC PEPTIDE - Abnormal; Notable for the following components:  ? B Natriuretic Peptide 511.2 (*)   ? All other components within normal limits  ?RESP PANEL BY RT-PCR (FLU A&B, COVID) [id number]  ?EXPECTORATED SPUTUM ASSESSMENT W GRAM STAIN, RFLX TO RESP C  ?CULTURE, BLOOD (ROUTINE X 2)  ?CULTURE, BLOOD (ROUTINE X 2)  ?MAGNESIUM  ?COMPREHENSIVE METABOLIC PANEL  ?CBC  ?PROCALCITONIN  ?PROCALCITONIN  ? ? ?EKG ?None ? ?Radiology ?DG Chest Port 1 View ? ?Result Date: 04/20/2022 ?CLINICAL DATA:  Dyspnea, cough EXAM: PORTABLE CHEST 1 VIEW COMPARISON:  03/25/2022 FINDINGS: Lung volumes are small,  but are symmetric and are stable since prior examination. Small bilateral pleural effusions are present. There is mild perihilar and lower lung zone predominant interstitial pulmonary edema, possibly cardiogenic in nature. No pneumothorax. Cardiac size is at the upper limits of normal, though this is likely accentuated by semi upright positioning. No acute bone abnormality. IMPRESSION: Mild cardiogenic failure with mild interstitial pulmonary edema and small bilateral pleural effusions. This appears progressive since prior examination. Electronically Signed   By: Helyn NumbersAshesh  Parikh M.D.   On: 04/20/2022 17:19   ? ?Procedures ?Procedures  ? ? ?Medications Ordered in ED ?Medications  ?azithromycin (ZITHROMAX) 500 mg in sodium chloride 0.9 % 250 mL IVPB (500 mg Intravenous New Bag/Given 04/20/22 1752)  ?ramipril (ALTACE) capsule 2.5 mg (has no administration in time range)  ?furosemide (LASIX) injection 20 mg (has no administration in time range)  ?enoxaparin (LOVENOX) injection 40 mg (has no administration in time range)  ?sodium chloride flush (NS) 0.9 % injection 3 mL (has no administration in time range)  ?acetaminophen (TYLENOL) tablet 650 mg (has no administration in time range)  ?  Or  ?acetaminophen (TYLENOL) suppository 650 mg (has no administration in time range)  ?polyethylene glycol (MIRALAX / GLYCOLAX) packet 17 g (has no administration in time range)  ?sodium chloride 0.9 % bolus 500 mL (0 mLs Intravenous Stopped 04/20/22 1825)  ?acetaminophen (TYLENOL) tablet 1,000 mg (1,000 mg Oral Given 04/20/22 1723)  ?cefTRIAXone (ROCEPHIN) 2 g in sodium chloride 0.9 % 100 mL IVPB (0 g Intravenous Stopped 04/20/22 1825)  ?furosemide (LASIX) injection 20 mg (20 mg Intravenous Given 04/20/22 1825)  ? ? ?ED Course/ Medical Decision Making/ A&P ?  ?                        ?Medical Decision Making ?Amount and/or Complexity of Data Reviewed ?Labs: ordered. ?Radiology: ordered. ? ?Risk ?OTC drugs. ?Prescription drug management. ?Decision  regarding hospitalization. ? ? ?88 yo M with a chief complaint of cough and sputum production.  Going on for metallic a few days per EMS.  Patient is unable provide any history.  Tells me that he has been coughing for a while and it depends on what he eats.  He denies any other symptoms.  Patient does not appear to be in any acute distress.  Will obtain a chest x-ray blood work small bolus of IV fluids and reassess. ? ?Chest x-ray independently interpreted by me with a right  lower lobe infiltrate.  Read as radiology is worsening pleural effusions.  Clinically this is less likely with the patient febrile coughing and with sputum production.  Will start on community-acquired pneumonia antibiotics. ? ?Patient had an episode of hypoxia and has been placed on oxygen.  On reassessment patient has developed a bit higher level of JVD.  We will give a bolus dose of Lasix. ? ?We will discuss with medicine for admission. ? ?CRITICAL CARE ?Performed by: Rae Roam ? ? ?Total critical care time: 35 minutes ? ?Critical care time was exclusive of separately billable procedures and treating other patients. ? ?Critical care was necessary to treat or prevent imminent or life-threatening deterioration. ? ?Critical care was time spent personally by me on the following activities: development of treatment plan with patient and/or surrogate as well as nursing, discussions with consultants, evaluation of patient's response to treatment, examination of patient, obtaining history from patient or surrogate, ordering and performing treatments and interventions, ordering and review of laboratory studies, ordering and review of radiographic studies, pulse oximetry and re-evaluation of patient's condition. ? ?The patients results and plan were reviewed and discussed.   ?Any x-rays performed were independently reviewed by myself.  ? ?Differential diagnosis were considered with the presenting HPI. ? ?Medications  ?azithromycin  (ZITHROMAX) 500 mg in sodium chloride 0.9 % 250 mL IVPB (500 mg Intravenous New Bag/Given 04/20/22 1752)  ?ramipril (ALTACE) capsule 2.5 mg (has no administration in time range)  ?furosemide (LASIX) injection 20 mg (has

## 2022-04-20 NOTE — H&P (Addendum)
?History and Physical  ? ?David Cervantes FAO:[id number] DOB: 1927-04-04 DOA: 04/20/2022 ? ?PCP: Georgann Housekeeper, MD  ? ?Patient coming from: Home ? ?Chief Complaint: Multiple complaints, fatigue, malaise, cough, rhinorrhea ? ?HPI: David Cervantes is a 88 y.o. male with medical history significant of dementia, diabetes, hypertension, hyperlipidemia presenting with multiple complaints as above including fatigue, cough, rhinorrhea, malaise. ? ?History obtained with assistance of family and chart review.  Patient has had several days of ongoing malaise, fatigue, cough and rhinorrhea, but not feeling too bad.  Started feeling significantly worse today.  He reports fevers and chills.  He had episode of nausea vomiting in route via EMS which is attributed to motion sickness. ? ?He denies chest pain, shortness of breath, abdominal pain, constipation, diarrhea. ? ?ED Course: Vital signs in the ED significant for fever to 102.4, blood pressure in the 150s to 190s systolic, respiratory rate in the 20s to 30s saturating well on but requiring 5 L.  Lab work-up included CMP with glucose 300, calcium 7.6, protein 5.9, albumin 2.6.  CBC with normal white count but hemoglobin of 11.3 which is near baseline of 12.  Chest x-ray showed mild heart failure changes with mild interstitial edema and small effusions with apparent progression from previous. Patient received ceftriaxone, azithromycin in the ED.  Received 500 cc bolus initially but then appeared volume overloaded and received a dose of Lasix. Note had purulent sputum in the ED and this was sent for culture. ? ?Review of Systems: As per HPI otherwise all other systems reviewed and are negative. ? ?Past Medical History:  ?Diagnosis Date  ? Diabetes mellitus without complication (HCC)   ? ? ?History reviewed. No pertinent surgical history. ? ?Social History ? reports that he does not currently use alcohol. He reports that he does not currently use drugs. No history on file  for tobacco use. ? ?No Known Allergies ? ?No family history on file. ? ?Prior to Admission medications   ?Medication Sig Start Date End Date Taking? Authorizing Provider  ?COVID-19 mRNA bivalent vaccine, Pfizer, (PFIZER COVID-19 VAC BIVALENT) injection Inject into the muscle. 10/20/21   Judyann Munson, MD  ?COVID-19 mRNA Vac-TriS, Pfizer, (PFIZER-BIONT COVID-19 VAC-TRIS) SUSP injection Inject into the muscle. 07/03/21   Judyann Munson, MD  ?LANTUS SOLOSTAR 100 UNIT/ML Solostar Pen Inject 21 Units into the skin daily. 01/21/22   [provider]  ?metFORMIN (GLUCOPHAGE-XR) 500 MG 24 hr tablet Take 500 mg by mouth 2 (two) times daily. 01/05/22   [provider]  ?ramipril (ALTACE) 2.5 MG capsule Take 2.5 mg by mouth daily. 01/05/22   [provider]  ? ? ?Physical Exam: ?Vitals:  ? 04/20/22 1737 04/20/22 1800 04/20/22 1815 04/20/22 1830  ?BP:  (!) 193/77 (!) 158/72 140/62  ?Pulse: 71 69 64 63  ?Resp: 15 (!) 24 (!) 27 (!) 34  ?Temp:      ?SpO2: 93% 94% (!) 88% 91%  ? ? ?Physical Exam ?Constitutional:   ?   General: He is not in acute distress. ?   Appearance: Normal appearance.  ?HENT:  ?   Head: Normocephalic and atraumatic.  ?   Mouth/Throat:  ?   Mouth: Mucous membranes are moist.  ?   Pharynx: Oropharynx is clear.  ?Eyes:  ?   Extraocular Movements: Extraocular movements intact.  ?   Pupils: Pupils are equal, round, and reactive to light.  ?Cardiovascular:  ?   Rate and Rhythm: Normal rate and regular rhythm.  ?  Pulses: Normal pulses.  ?   Heart sounds: Normal heart sounds.  ?Pulmonary:  ?   Effort: Pulmonary effort is normal. No respiratory distress.  ?   Breath sounds: Rales present.  ?Abdominal:  ?   General: Bowel sounds are normal. There is no distension.  ?   Palpations: Abdomen is soft.  ?   Tenderness: There is no abdominal tenderness.  ?Musculoskeletal:     ?   General: No swelling or deformity.  ?Skin: ?   General: Skin is warm and dry.  ?Neurological:  ?   General: No focal  deficit present.  ?   Mental Status: Mental status is at baseline.  ?   Comments: Chronic dementia  ? ?Labs on Admission: I have personally reviewed following labs and imaging studies ? ?CBC: ?Recent Labs  ?Lab 04/20/22 ?1725  ?WBC 9.5  ?NEUTROABS 8.5*  ?HGB 11.3*  ?HCT 33.8*  ?MCV 94.4  ?PLT 270  ? ? ?Basic Metabolic Panel: ?Recent Labs  ?Lab 04/20/22 ?1725  ?NA 135  ?K 3.5  ?CL 103  ?CO2 23  ?GLUCOSE 300*  ?BUN 19  ?CREATININE 1.00  ?CALCIUM 7.6*  ? ? ?GFR: ?CrCl cannot be calculated (Unknown ideal weight.). ? ?Liver Function Tests: ?Recent Labs  ?Lab 04/20/22 ?1725  ?AST 28  ?ALT 24  ?ALKPHOS 94  ?BILITOT 0.7  ?PROT 5.9*  ?ALBUMIN 2.6*  ? ? ?Urine analysis: ?   ?Component Value Date/Time  ? COLORURINE YELLOW 03/25/2022 2120  ? APPEARANCEUR CLEAR 03/25/2022 2120  ? LABSPEC 1.010 03/25/2022 2120  ? PHURINE 5.0 03/25/2022 2120  ? GLUCOSEU NEGATIVE 03/25/2022 2120  ? HGBUR MODERATE (A) 03/25/2022 2120  ? BILIRUBINUR NEGATIVE 03/25/2022 2120  ? KETONESUR NEGATIVE 03/25/2022 2120  ? PROTEINUR 100 (A) 03/25/2022 2120  ? NITRITE NEGATIVE 03/25/2022 2120  ? LEUKOCYTESUR NEGATIVE 03/25/2022 2120  ? ? ?Radiological Exams on Admission: ?DG Chest Port 1 View ? ?Result Date: 04/20/2022 ?CLINICAL DATA:  Dyspnea, cough EXAM: PORTABLE CHEST 1 VIEW COMPARISON:  03/25/2022 FINDINGS: Lung volumes are small, but are symmetric and are stable since prior examination. Small bilateral pleural effusions are present. There is mild perihilar and lower lung zone predominant interstitial pulmonary edema, possibly cardiogenic in nature. No pneumothorax. Cardiac size is at the upper limits of normal, though this is likely accentuated by semi upright positioning. No acute bone abnormality. IMPRESSION: Mild cardiogenic failure with mild interstitial pulmonary edema and small bilateral pleural effusions. This appears progressive since prior examination. Electronically Signed   By: Helyn NumbersAshesh  Parikh M.D.   On: 04/20/2022 17:19   ? ?EKG: Not performed in  the emergency department ? ?Assessment/Plan ?Principal Problem: ?  Acute respiratory failure with hypoxia (HCC) ?Active Problems: ?  DM (diabetes mellitus) (HCC) ?  HTN (hypertension) ?  ?Acute respiratory failure with hypoxia ??Acute CHF ??Pneumonia ?> Patient presenting with multiple complaints noted to be in respiratory failure with hypoxia in the ED now requiring 5 L. ?> Possibly multifactorial as he has been having cough which appeared to have purulent sputum in the ED which has been sent for culture concerning for pneumonia despite lack of leukocytosis.  He was noted to have a fever to 102.4. ?> Also possibility of new onset CHF considering patient appears to volume overloaded on chest x-ray.  BNP elevated to 500. ?> Did receive a dose of Lasix in the ED as well as ceftriaxone and azithromycin. ?- Monitor on telemetry ?- Continue supplemental oxygen, wean as tolerated ?For suspected CHF ?- Continue with  Lasix twice daily for now ?- Follow-up BMP ?- Echocardiogram ?- Strict I's and O's and daily weights ?- Check magnesium ?For possible pneumonia ?- Hold off on further antibiotics as he will be covered through tomorrow anyway ?- Check procalcitonin ?- Follow-up respiratory panel for flu and COVID, if negative consider full RVP ?- Trend fever curve and WBC ? ?Hypertension ?-Continue home ramipril ? ?Diabetes ?- On 18 units of insulin daily at home ?- 10 units long-acting insulin nightly ?- SSI ? ?DVT prophylaxis: Lovenox ?Code Status:   Partial, no compressions nor ACLS nor shock, intubation okay ?Family Communication:  Updated at bedside. ?Disposition Plan:  ? Patient is from:  Home ? Anticipated DC to:  Home ? Anticipated DC date:  1 to 4 days ? Anticipated DC barriers: None  ?Consults called:  None ?Admission status:  Observation, telemetry ? ?Severity of Illness: ?The appropriate patient status for this patient is OBSERVATION. Observation status is judged to be reasonable and necessary in order to provide the  required intensity of service to ensure the patient's safety. The patient's presenting symptoms, physical exam findings, and initial radiographic and laboratory data in the context of their medical conditi

## 2022-04-21 ENCOUNTER — Observation Stay (HOSPITAL_COMMUNITY): Payer: Medicare PPO

## 2022-04-21 ENCOUNTER — Encounter (HOSPITAL_COMMUNITY): Payer: Self-pay | Admitting: Internal Medicine

## 2022-04-21 DIAGNOSIS — J9601 Acute respiratory failure with hypoxia: Secondary | ICD-10-CM | POA: Diagnosis present

## 2022-04-21 DIAGNOSIS — R0609 Other forms of dyspnea: Secondary | ICD-10-CM

## 2022-04-21 DIAGNOSIS — I110 Hypertensive heart disease with heart failure: Secondary | ICD-10-CM | POA: Diagnosis present

## 2022-04-21 DIAGNOSIS — Z79899 Other long term (current) drug therapy: Secondary | ICD-10-CM | POA: Diagnosis not present

## 2022-04-21 DIAGNOSIS — F0390 Unspecified dementia without behavioral disturbance: Secondary | ICD-10-CM | POA: Diagnosis present

## 2022-04-21 DIAGNOSIS — N179 Acute kidney failure, unspecified: Secondary | ICD-10-CM | POA: Diagnosis present

## 2022-04-21 DIAGNOSIS — J189 Pneumonia, unspecified organism: Secondary | ICD-10-CM

## 2022-04-21 DIAGNOSIS — I272 Pulmonary hypertension, unspecified: Secondary | ICD-10-CM | POA: Diagnosis present

## 2022-04-21 DIAGNOSIS — E876 Hypokalemia: Secondary | ICD-10-CM | POA: Diagnosis present

## 2022-04-21 DIAGNOSIS — J129 Viral pneumonia, unspecified: Secondary | ICD-10-CM | POA: Diagnosis not present

## 2022-04-21 DIAGNOSIS — Z20822 Contact with and (suspected) exposure to covid-19: Secondary | ICD-10-CM | POA: Diagnosis present

## 2022-04-21 DIAGNOSIS — E0800 Diabetes mellitus due to underlying condition with hyperosmolarity without nonketotic hyperglycemic-hyperosmolar coma (NKHHC): Secondary | ICD-10-CM

## 2022-04-21 DIAGNOSIS — I10 Essential (primary) hypertension: Secondary | ICD-10-CM | POA: Diagnosis not present

## 2022-04-21 DIAGNOSIS — E1165 Type 2 diabetes mellitus with hyperglycemia: Secondary | ICD-10-CM | POA: Diagnosis present

## 2022-04-21 DIAGNOSIS — Z794 Long term (current) use of insulin: Secondary | ICD-10-CM | POA: Diagnosis not present

## 2022-04-21 DIAGNOSIS — E860 Dehydration: Secondary | ICD-10-CM | POA: Diagnosis not present

## 2022-04-21 DIAGNOSIS — I5031 Acute diastolic (congestive) heart failure: Secondary | ICD-10-CM | POA: Diagnosis present

## 2022-04-21 DIAGNOSIS — J122 Parainfluenza virus pneumonia: Secondary | ICD-10-CM | POA: Diagnosis present

## 2022-04-21 DIAGNOSIS — E785 Hyperlipidemia, unspecified: Secondary | ICD-10-CM | POA: Diagnosis present

## 2022-04-21 LAB — RESPIRATORY PANEL BY PCR

## 2022-04-21 LAB — COMPREHENSIVE METABOLIC PANEL
ALT: 22 U/L (ref 0–44)
AST: 27 U/L (ref 15–41)
Albumin: 2.6 g/dL — ABNORMAL LOW (ref 3.5–5.0)
Alkaline Phosphatase: 91 U/L (ref 38–126)
Anion gap: 9 (ref 5–15)
BUN: 23 mg/dL (ref 8–23)
CO2: 25 mmol/L (ref 22–32)
Calcium: 7.7 mg/dL — ABNORMAL LOW (ref 8.9–10.3)
Chloride: 104 mmol/L (ref 98–111)
Creatinine, Ser: 1.26 mg/dL — ABNORMAL HIGH (ref 0.61–1.24)
GFR, Estimated: 54 mL/min — ABNORMAL LOW (ref >60)
Glucose, Bld: 173 mg/dL — ABNORMAL HIGH (ref 70–99)
Potassium: 3.1 mmol/L — ABNORMAL LOW (ref 3.5–5.1)
Sodium: 138 mmol/L (ref 135–145)
Total Bilirubin: 0.3 mg/dL (ref 0.3–1.2)
Total Protein: 5.9 g/dL — ABNORMAL LOW (ref 6.5–8.1)

## 2022-04-21 LAB — GLUCOSE, CAPILLARY
Glucose-Capillary: 117 mg/dL — ABNORMAL HIGH (ref 70–99)
Glucose-Capillary: 188 mg/dL — ABNORMAL HIGH (ref 70–99)

## 2022-04-21 LAB — ECHOCARDIOGRAM COMPLETE
Height: 70 in
S' Lateral: 3.1 cm
Weight: 2160 oz

## 2022-04-21 LAB — CBC
HCT: 38.5 % — ABNORMAL LOW (ref 39.0–52.0)
Hemoglobin: 12.8 g/dL — ABNORMAL LOW (ref 13.0–17.0)
MCH: 31.2 pg (ref 26.0–34.0)
MCHC: 33.2 g/dL (ref 30.0–36.0)
MCV: 93.9 fL (ref 80.0–100.0)
Platelets: 272 10*3/uL (ref 150–400)
RBC: 4.1 MIL/uL — ABNORMAL LOW (ref 4.22–5.81)
RDW: 13.6 % (ref 11.5–15.5)
WBC: 9.8 10*3/uL (ref 4.0–10.5)
nRBC: 0 % (ref 0.0–0.2)

## 2022-04-21 LAB — CBG MONITORING, ED: Glucose-Capillary: 168 mg/dL — ABNORMAL HIGH (ref 70–99)

## 2022-04-21 LAB — PROCALCITONIN: Procalcitonin: 36.33 ng/mL

## 2022-04-21 MED ORDER — IPRATROPIUM-ALBUTEROL 0.5-2.5 (3) MG/3ML IN SOLN
3.0000 mL | Freq: Four times a day (QID) | RESPIRATORY_TRACT | Status: DC
  Administered 2022-04-21: 3 mL via RESPIRATORY_TRACT
  Filled 2022-04-21: qty 3

## 2022-04-21 MED ORDER — DEXTROSE-NACL 5-0.9 % IV SOLN: INTRAVENOUS | Status: DC

## 2022-04-21 MED ORDER — POTASSIUM CHLORIDE CRYS ER 20 MEQ PO TBCR
50.0000 meq | EXTENDED_RELEASE_TABLET | Freq: Once | ORAL | Status: AC
  Administered 2022-04-21: 50 meq via ORAL
  Filled 2022-04-21: qty 1

## 2022-04-21 MED ORDER — SODIUM CHLORIDE 0.9 % IV SOLN
2.0000 g | INTRAVENOUS | Status: DC
  Administered 2022-04-21: 2 g via INTRAVENOUS
  Filled 2022-04-21 (×2): qty 20

## 2022-04-21 MED ORDER — METHYLPREDNISOLONE SODIUM SUCC 125 MG IJ SOLR
60.0000 mg | Freq: Two times a day (BID) | INTRAMUSCULAR | Status: DC
  Administered 2022-04-21 – 2022-04-24 (×7): 60 mg via INTRAVENOUS
  Filled 2022-04-21 (×7): qty 2

## 2022-04-21 MED ORDER — IPRATROPIUM-ALBUTEROL 0.5-2.5 (3) MG/3ML IN SOLN
3.0000 mL | Freq: Four times a day (QID) | RESPIRATORY_TRACT | Status: DC
  Administered 2022-04-21 – 2022-04-22 (×2): 3 mL via RESPIRATORY_TRACT
  Filled 2022-04-21 (×2): qty 3

## 2022-04-21 MED ORDER — SODIUM CHLORIDE 0.9 % IV SOLN
500.0000 mg | INTRAVENOUS | Status: DC
  Administered 2022-04-21: 500 mg via INTRAVENOUS
  Filled 2022-04-21 (×2): qty 5

## 2022-04-21 NOTE — ED Notes (Signed)
Pt requesting to take medications later.  ED RN informed pt and daughter at bedside to inform ed rn when pt is ready for am meds. Pt and daughter state understanding.  ?

## 2022-04-21 NOTE — Progress Notes (Signed)
?PROGRESS NOTE ? ? ? ?David Cervantes  [id number] DOB: 21-Aug-1927 DOA: 04/20/2022 ?PCP: [name], MD  ? ? ? ?Brief Narrative:  ?88 y.o. male with medical history significant of dementia, diabetes, hypertension, hyperlipidemia presenting with multiple complaints as above including fatigue, cough, rhinorrhea, malaise. ? ?History obtained with assistance of family and chart review.  Patient has had several days of ongoing malaise, fatigue, cough and rhinorrhea, but not feeling too bad.  Started feeling significantly worse today.  He reports fevers and chills.  He had episode of nausea vomiting in route via EMS which is attributed to motion sickness. ? ?He denies chest pain, shortness of breath, abdominal pain, constipation, diarrhea. ?  ?ED Course: Vital signs in the ED significant for fever to 102.4, blood pressure in the [id number] to [id number] systolic, respiratory rate in the 20s to 30s saturating well on but requiring 5 L.  Lab work-up included CMP with glucose 300, calcium 7.6, protein 5.9, albumin 2.6.  CBC with normal white count but hemoglobin of 11.3 which is near baseline of 12.  Chest x-ray showed mild heart failure changes with mild interstitial edema and small effusions with apparent progression from previous. Patient received ceftriaxone, azithromycin in the ED.  Received 500 cc bolus initially but then appeared volume overloaded and received a dose of Lasix. Note had purulent sputum in the ED and this was sent for culture. ? ? ?Subjective: ?A/O x4, does not use O2 at home.  Does not use CPAP at home.  Per daughter ambulates up and down driveway 1-2 times per day (driveway~200 m). ? ? ?Assessment & Plan: ?Covid vaccination; ?  ?Principal Problem: ?  Acute respiratory failure with hypoxia ([place]) ?Active Problems: ?  DM (diabetes mellitus) ([place]) ?  HTN (hypertension) ?  Acute CHF ([place]) ? ?Acute respiratory failure with hypoxia ?-DuoNeb QID ?-Solu-Medrol 60 mg BID ?-Incentive spirometry ?- Flutter  valve ?-Continue current antibiotics x7 days ?-5/10 respiratory virus panel pending ?-5/10 D5W-0.9% saline 38ml/hr ? ??Pneumonia ?> Patient presenting with multiple complaints noted to be in respiratory failure with hypoxia in the ED now requiring 5 L. ?> Possibly multifactorial as he has been having cough which appeared to have purulent sputum in the ED which has been sent for culture concerning for pneumonia despite lack of leukocytosis.  He was noted to have a fever to 102.4. ?> Also possibility of new onset CHF considering patient appears to volume overloaded on chest x-ray.  BNP elevated to 500. ?> Did receive a dose of Lasix in the ED as well as ceftriaxone and azithromycin. ?- Monitor on telemetry ?- Continue supplemental oxygen, wean as tolerated ? ? ?Acute CHF? ?- 5/10 echocardiogram pending ? ?  ?Hypertension ?-Continue home ramipril ? ?Diabetes ?- On 18 units of insulin daily at home ?- 10 units long-acting insulin nightly ?- SSI ? ?Hypokalemia ?- 5/10 K-Dur 50 meq ? ? ?  ? ? ?Mobility Assessment (last 72 hours)   ? ? Mobility Assessment   ?No documentation. ? ?  ?  ? ?  ? ? ?DVT prophylaxis: Lovenox ?Code Status: Partial ?Family Communication: 5/10 daughter at bedside for discussion of plan of care all questions answered ?Status is: Inpatient ? ? ? ?Dispo: The patient is from: Home ?             Anticipated d/c is to: Home ?             Anticipated d/c date is: > 3 days ?  Patient currently is not medically stable to d/c. ? ? ? ? ? ?Consultants:  ? ? ?Procedures/Significant Events:  ? ? ?I have personally reviewed and interpreted all radiology studies and my findings are as above. ? ?VENTILATOR SETTINGS: ?Nasal cannula 5/10 ?Flow 2 L/min ?SPO2 93% ? ? ?Cultures ?5/9 influenza A/B negative ?5/9 SARS coronavirus negative ?5/10 respiratory virus panel pending ? ? ?Antimicrobials: ?Anti-infectives (From admission, onward)  ? ? Start     Dose/Rate Route Frequency Ordered Stop  ? 04/21/22 1800   cefTRIAXone (ROCEPHIN) 2 g in sodium chloride 0.9 % 100 mL IVPB       ? 2 g ?200 mL/hr over 30 Minutes Intravenous Every 24 hours 04/21/22 1714    ? 04/21/22 1800  azithromycin (ZITHROMAX) 500 mg in sodium chloride 0.9 % 250 mL IVPB       ? 500 mg ?250 mL/hr over 60 Minutes Intravenous Every 24 hours 04/21/22 1714    ? 04/20/22 1745  cefTRIAXone (ROCEPHIN) 2 g in sodium chloride 0.9 % 100 mL IVPB       ? 2 g ?200 mL/hr over 30 Minutes Intravenous  Once 04/20/22 1732 04/20/22 1825  ? 04/20/22 1745  azithromycin (ZITHROMAX) 500 mg in sodium chloride 0.9 % 250 mL IVPB       ? 500 mg ?250 mL/hr over 60 Minutes Intravenous  Once 04/20/22 1732 04/20/22 2150  ? ?  ?  ?   ? ? ?Devices ?  ? ?LINES / TUBES:  ? ? ? ? ?Continuous Infusions: ? ? ?Objective: ?Vitals:  ? 04/21/22 0300 04/21/22 0600 04/21/22 0630 04/21/22 0645  ?BP: (!) 127/96 (!) 175/53    ?Pulse: 68 75    ?Resp: (!) 21 (!) 27 17   ?Temp:      ?TempSrc:      ?SpO2: 90% 94%  97%  ?Weight:      ?Height:      ? ? ?Intake/Output Summary (Last 24 hours) at 04/21/2022 0738 ?Last data filed at 04/20/2022 2150 ?Gross per 24 hour  ?Intake 254.03 ml  ?Output --  ?Net 254.03 ml  ? ?Filed Weights  ? 04/20/22 2146  ?Weight: 61.2 kg  ? ? ?Examination: ? ?General: A/O x4, positive acute respiratory distress, cachectic, hard of hearing ?Eyes: negative scleral hemorrhage, negative anisocoria, negative icterus ?ENT: Negative Runny nose, negative gingival bleeding, ?Neck:  Negative scars, masses, torticollis, lymphadenopathy, JVD ?Lungs: Clear to auscultation right lobe, positive diffuse rhonchi left lobes, positive mild expiratory wheeze  ?Cardiovascular: Regular rate and rhythm without murmur gallop or rub normal S1 and S2 ?Abdomen: negative abdominal pain, nondistended, positive soft, bowel sounds, no rebound, no ascites, no appreciable mass ?Extremities: No significant cyanosis, clubbing, or edema bilateral lower extremities ?Skin: Negative rashes, lesions, ulcers ?Psychiatric:   Negative depression, negative anxiety, negative fatigue, negative mania  ?Central nervous system:  Cranial nerves II through XII intact, tongue/uvula midline, all extremities muscle strength 5/5, sensation intact throughout, negative dysarthria, negative expressive aphasia, negative receptive aphasia. ? ?.  ? ? ? ?Data Reviewed: Care during the described time interval was provided by me .  I have reviewed this patient's available data, including medical history, events of note, physical examination, and all test results as part of my evaluation. ? ?CBC: ?Recent Labs  ?Lab 04/20/22 ?1725 04/21/22 ?0407  ?WBC 9.5 9.8  ?NEUTROABS 8.5*  --   ?HGB 11.3* 12.8*  ?HCT 33.8* 38.5*  ?MCV 94.4 93.9  ?PLT 270 272  ? ?Basic Metabolic Panel: ?Recent  Labs  ?Lab 04/20/22 ?1725 04/21/22 ?0407  ?NA 135 138  ?K 3.5 3.1*  ?CL 103 104  ?CO2 23 25  ?GLUCOSE 300* 173*  ?BUN 19 23  ?CREATININE 1.00 1.26*  ?CALCIUM 7.6* 7.7*  ?MG 1.6*  --   ? ?GFR: ?Estimated Creatinine Clearance: 32.4 mL/min (A) (by C-G formula based on SCr of 1.26 mg/dL (H)). ?Liver Function Tests: ?Recent Labs  ?Lab 04/20/22 ?1725 04/21/22 ?0407  ?AST 28 27  ?ALT 24 22  ?ALKPHOS 94 91  ?BILITOT 0.7 0.3  ?PROT 5.9* 5.9*  ?ALBUMIN 2.6* 2.6*  ? ?No results for input(s): LIPASE, AMYLASE in the last 168 hours. ?No results for input(s): AMMONIA in the last 168 hours. ?Coagulation Profile: ?No results for input(s): INR, PROTIME in the last 168 hours. ?Cardiac Enzymes: ?No results for input(s): CKTOTAL, CKMB, CKMBINDEX, TROPONINI in the last 168 hours. ?BNP (last 3 results) ?No results for input(s): PROBNP in the last 8760 hours. ?HbA1C: ?No results for input(s): HGBA1C in the last 72 hours. ?CBG: ?Recent Labs  ?Lab 04/20/22 ?2220  ?GLUCAP 304*  ? ?Lipid Profile: ?No results for input(s): CHOL, HDL, LDLCALC, TRIG, CHOLHDL, LDLDIRECT in the last 72 hours. ?Thyroid Function Tests: ?No results for input(s): TSH, T4TOTAL, FREET4, T3FREE, THYROIDAB in the last 72 hours. ?Anemia  Panel: ?No results for input(s): VITAMINB12, FOLATE, FERRITIN, TIBC, IRON, RETICCTPCT in the last 72 hours. ?Sepsis Labs: ?Recent Labs  ?Lab 04/20/22 ?1725  ?PROCALCITON 1.06  ? ? ?Recent Results (from the past 240 hour(

## 2022-04-21 NOTE — Progress Notes (Signed)
Pt has poor effort with flutter valve.  ?

## 2022-04-21 NOTE — Progress Notes (Signed)
Patient arrived to room 1445.  VS checked, CBG 117.  Telemetry started.  Patient denies pain, nausea, discomfort. ? ?Bradd Burner, RN  ?

## 2022-04-21 NOTE — ED Notes (Signed)
Patient was cleaned up after a bowel movement and sheets were changed. ?

## 2022-04-22 DIAGNOSIS — J9601 Acute respiratory failure with hypoxia: Secondary | ICD-10-CM | POA: Diagnosis not present

## 2022-04-22 DIAGNOSIS — I5031 Acute diastolic (congestive) heart failure: Secondary | ICD-10-CM

## 2022-04-22 DIAGNOSIS — I10 Essential (primary) hypertension: Secondary | ICD-10-CM | POA: Diagnosis not present

## 2022-04-22 DIAGNOSIS — J129 Viral pneumonia, unspecified: Secondary | ICD-10-CM | POA: Diagnosis not present

## 2022-04-22 DIAGNOSIS — J189 Pneumonia, unspecified organism: Secondary | ICD-10-CM | POA: Diagnosis not present

## 2022-04-22 LAB — COMPREHENSIVE METABOLIC PANEL
ALT: 21 U/L (ref 0–44)
AST: 27 U/L (ref 15–41)
Albumin: 2.3 g/dL — ABNORMAL LOW (ref 3.5–5.0)
Alkaline Phosphatase: 76 U/L (ref 38–126)
Anion gap: 8 (ref 5–15)
BUN: 25 mg/dL — ABNORMAL HIGH (ref 8–23)
CO2: 25 mmol/L (ref 22–32)
Calcium: 7.8 mg/dL — ABNORMAL LOW (ref 8.9–10.3)
Chloride: 106 mmol/L (ref 98–111)
Creatinine, Ser: 1.24 mg/dL (ref 0.61–1.24)
GFR, Estimated: 55 mL/min — ABNORMAL LOW (ref >60)
Glucose, Bld: 261 mg/dL — ABNORMAL HIGH (ref 70–99)
Potassium: 3.5 mmol/L (ref 3.5–5.1)
Sodium: 139 mmol/L (ref 135–145)
Total Bilirubin: 0.4 mg/dL (ref 0.3–1.2)
Total Protein: 5.5 g/dL — ABNORMAL LOW (ref 6.5–8.1)

## 2022-04-22 LAB — CBC WITH DIFFERENTIAL/PLATELET
Abs Immature Granulocytes: 0.04 10*3/uL (ref 0.00–0.07)
Basophils Absolute: 0 10*3/uL (ref 0.0–0.1)
Basophils Relative: 0 %
Eosinophils Absolute: 0 10*3/uL (ref 0.0–0.5)
Eosinophils Relative: 0 %
HCT: 35.8 % — ABNORMAL LOW (ref 39.0–52.0)
Hemoglobin: 11.5 g/dL — ABNORMAL LOW (ref 13.0–17.0)
Immature Granulocytes: 1 %
Lymphocytes Relative: 8 %
Lymphs Abs: 0.5 10*3/uL — ABNORMAL LOW (ref 0.7–4.0)
MCH: 30.8 pg (ref 26.0–34.0)
MCHC: 32.1 g/dL (ref 30.0–36.0)
MCV: 96 fL (ref 80.0–100.0)
Monocytes Absolute: 0.1 10*3/uL (ref 0.1–1.0)
Monocytes Relative: 1 %
Neutro Abs: 5.9 10*3/uL (ref 1.7–7.7)
Neutrophils Relative %: 90 %
Platelets: 240 10*3/uL (ref 150–400)
RBC: 3.73 MIL/uL — ABNORMAL LOW (ref 4.22–5.81)
RDW: 13.6 % (ref 11.5–15.5)
WBC: 6.6 10*3/uL (ref 4.0–10.5)
nRBC: 0 % (ref 0.0–0.2)

## 2022-04-22 LAB — GLUCOSE, CAPILLARY
Glucose-Capillary: 273 mg/dL — ABNORMAL HIGH (ref 70–99)
Glucose-Capillary: 330 mg/dL — ABNORMAL HIGH (ref 70–99)
Glucose-Capillary: 314 mg/dL — ABNORMAL HIGH (ref 70–99)
Glucose-Capillary: 387 mg/dL — ABNORMAL HIGH (ref 70–99)
Glucose-Capillary: 418 mg/dL — ABNORMAL HIGH (ref 70–99)

## 2022-04-22 LAB — PROCALCITONIN: Procalcitonin: 40.57 ng/mL

## 2022-04-22 LAB — PHOSPHORUS: Phosphorus: 3.2 mg/dL (ref 2.5–4.6)

## 2022-04-22 LAB — MAGNESIUM: Magnesium: 1.6 mg/dL — ABNORMAL LOW (ref 1.7–2.4)

## 2022-04-22 MED ORDER — IPRATROPIUM-ALBUTEROL 0.5-2.5 (3) MG/3ML IN SOLN
3.0000 mL | Freq: Two times a day (BID) | RESPIRATORY_TRACT | Status: DC
  Administered 2022-04-22 – 2022-04-23 (×3): 3 mL via RESPIRATORY_TRACT
  Filled 2022-04-22 (×3): qty 3

## 2022-04-22 MED ORDER — HALOPERIDOL LACTATE 5 MG/ML IJ SOLN
1.0000 mg | Freq: Once | INTRAMUSCULAR | Status: AC
  Administered 2022-04-22: 1 mg via INTRAMUSCULAR
  Filled 2022-04-22: qty 1

## 2022-04-22 MED ORDER — INSULIN ASPART 100 UNIT/ML IJ SOLN
5.0000 [IU] | Freq: Three times a day (TID) | INTRAMUSCULAR | Status: DC
  Administered 2022-04-22 – 2022-04-24 (×7): 5 [IU] via SUBCUTANEOUS

## 2022-04-22 MED ORDER — DM-GUAIFENESIN ER 30-600 MG PO TB12
1.0000 | ORAL_TABLET | Freq: Two times a day (BID) | ORAL | Status: DC
  Administered 2022-04-22 – 2022-04-25 (×7): 1 via ORAL
  Filled 2022-04-22 (×7): qty 1

## 2022-04-22 MED ORDER — MAGNESIUM SULFATE 4 GM/100ML IV SOLN
4.0000 g | Freq: Once | INTRAVENOUS | Status: AC
  Administered 2022-04-22: 4 g via INTRAVENOUS
  Filled 2022-04-22: qty 100

## 2022-04-22 MED ORDER — POTASSIUM CHLORIDE CRYS ER 20 MEQ PO TBCR
40.0000 meq | EXTENDED_RELEASE_TABLET | Freq: Once | ORAL | Status: AC
  Administered 2022-04-22: 40 meq via ORAL
  Filled 2022-04-22: qty 2

## 2022-04-22 MED ORDER — INSULIN ASPART 100 UNIT/ML IJ SOLN
0.0000 [IU] | INTRAMUSCULAR | Status: DC
  Administered 2022-04-22 (×2): 15 [IU] via SUBCUTANEOUS
  Administered 2022-04-23: 5 [IU] via SUBCUTANEOUS
  Administered 2022-04-23 (×2): 2 [IU] via SUBCUTANEOUS
  Administered 2022-04-23: 3 [IU] via SUBCUTANEOUS
  Administered 2022-04-23: 8 [IU] via SUBCUTANEOUS
  Administered 2022-04-24: 5 [IU] via SUBCUTANEOUS
  Administered 2022-04-24: 8 [IU] via SUBCUTANEOUS
  Administered 2022-04-24 (×2): 3 [IU] via SUBCUTANEOUS
  Administered 2022-04-24: 5 [IU] via SUBCUTANEOUS
  Administered 2022-04-24: 2 [IU] via SUBCUTANEOUS

## 2022-04-22 MED ORDER — INSULIN GLARGINE-YFGN 100 UNIT/ML ~~LOC~~ SOLN
15.0000 [IU] | Freq: Every day | SUBCUTANEOUS | Status: DC
  Administered 2022-04-22 – 2022-04-24 (×3): 15 [IU] via SUBCUTANEOUS
  Filled 2022-04-22 (×4): qty 0.15

## 2022-04-22 NOTE — Evaluation (Signed)
Physical Therapy Evaluation ?Patient Details ?Name: David Cervantes ?MRN: [id number] ?DOB: [birth year] ?Today's Date: 04/22/2022 ? ?History of Present Illness ? 88 y.o. male with medical history significant of dementia, diabetes, hypertension, hyperlipidemia presenting with multiple complaints as above including fatigue, cough, rhinorrhea, malaise. Admitted with acute respiratory failure with hypoxia. ?  ?Clinical Impression ? Pt is a 88 y.o. male with above HPI resulting in the deficits listed below (see PT Problem List). Pt is independent without use of AD at baseline and his daughter has been staying with him following a fall (pt states he tripped over object outside).  Pt ambulated total of ~193ft with multiple standing rest breaks due to O2 desats. Pt reports no supplemental O2 use at baseline, but demonstrating O2 desats to 81-82% on RA and while on 2L during ambulation requiring titration to 4L for recovery, able to wean back to 2L at rest with O2 >90%. Balance deficits observed during ambulation, discussion with pt and his daughter on potential use of cane to allow pt independence while improving stability. Pt stating that he could use one, but daughter concerned about his adherence with continued use after d/c. Will trial and provide AD education in future sessions. Pt will benefit from skilled PT to maximize functional mobility and works towards increasing independence.  ?   ?   ? ?Recommendations for follow up therapy are one component of a multi-disciplinary discharge planning process, led by the attending physician.  Recommendations may be updated based on patient status, additional functional criteria and insurance authorization. ? ?Follow Up Recommendations Home health PT ? ?  ?Assistance Recommended at Discharge Frequent or constant Supervision/Assistance  ?Patient can return home with the following ? A little help with walking and/or transfers;Assistance with cooking/housework;Assist for  transportation;Help with stairs or ramp for entrance;A little help with bathing/dressing/bathroom ? ?  ?Equipment Recommendations [facility] [facility] (discussed use of cane, will trial in future sessions)  ?Recommendations for Other Services ?    ?  ?Functional Status Assessment Patient has had a recent decline in their functional status and demonstrates the ability to make significant improvements in function in a reasonable and predictable amount of time.  ? ?  ?Precautions / Restrictions Precautions ?Precautions: Fall ?Precaution Comments: HOH ?Restrictions ?Weight Bearing Restrictions: No  ? ?  ? ?Mobility ? Bed Mobility ?  ?  ?  ?  ?  ?  ?  ?General bed mobility comments: OOB in recliner upon entry ?  ? ?Transfers ?Overall transfer level: Needs assistance ?Equipment used: Rolling walker (2 wheels) ?Transfers: Sit to/from Stand, Bed to chair/wheelchair/BSC ?Sit to Stand: Min guard ?  ?  ?  ?  ?  ?General transfer comment: MIN guard for steadying in standing initially. ?  ? ?Ambulation/Gait ?Ambulation/Gait assistance: Min assist ?Gait Distance (Feet): 100 Feet (multiple standing rest breaks) ?Assistive device: None ?Gait Pattern/deviations: Step-through pattern, Decreased stride length, Drifts right/left ?Gait velocity: decr ?  ?  ?General Gait Details: intermittent episodes of MIN guard, mostly MIN A for stability and intermittent furniture reaching. Pt with O2 desat to 81-82% while on 2L requiring titration up to 4L, rest break, and cues for deep breathing techinques for recovery >90% during mobility, able to wean back to 2L at end of session following seated rest with O2 sats at 99%, HR 56bpm. Discussed potential use of quad cane to maximize stability with mobility, pt stating " I could use it" but daughter reporting that pt may not continue use upon d/c as he has not  been compliant with one prior. ? ?Stairs ?  ?  ?  ?  ?  ? ?Wheelchair Mobility ?  ? ?Modified Rankin (Stroke Patients Only) ?  ? ?  ? ?Balance Overall balance  assessment: Needs assistance ?Sitting-balance support: No upper extremity supported, Feet supported ?Sitting balance-Leahy Scale: Fair ?  ?  ?Standing balance support: No upper extremity supported, During functional activity ?Standing balance-Leahy Scale: Fair ?  ?  ?  ?  ?  ?  ?  ?  ?  ?  ?  ?  ?   ? ? ? ?Pertinent Vitals/Pain Pain Assessment ?Pain Assessment: No/denies pain  ? ? ?Home Living Family/patient expects to be discharged to:: Private residence ?Living Arrangements: Children ?Available Help at Discharge: Family;Available 24 hours/day ?Type of Home: House ?Home Access: Stairs to enter ?Entrance Stairs-Rails: Can reach both;Left;Right ?Entrance Stairs-Number of Steps: 5 ?  ?Home Layout: One level ?Home Equipment: BSC/3in1;Shower seat;Rolling Walker (2 wheels);Standard Walker;Cane - single point ?Additional Comments: Was living alone prior to fall ~3 weeks ago. daughter has been living with him since fall.  ?  ?Prior Function Prior Level of Function : Independent/Modified Independent ?  ?  ?  ?  ?  ?  ?Mobility Comments: doesn't want to use a device. would walk 1-2x/day along driveway and outdoor ~150-279ft and around home ~21ft per daughter ?ADLs Comments: trouble with buttons ?  ? ? ?Hand Dominance  ? Dominant Hand: Right ? ?  ?Extremity/Trunk Assessment  ? Upper Extremity Assessment ?Upper Extremity Assessment: Defer to OT evaluation ?  ? ?Lower Extremity Assessment ?Lower Extremity Assessment: Overall WFL for tasks assessed (MMT grossly 5/5) ?  ? ?Cervical / Trunk Assessment ?Cervical / Trunk Assessment: Kyphotic  ?Communication  ? Communication: HOH  ?Cognition Arousal/Alertness: Awake/alert ?Behavior During Therapy: [facility] for tasks assessed/performed ?Overall Cognitive Status: Within Functional Limits for tasks assessed ?  ?  ?  ?  ?  ?  ?  ?  ?  ?  ?  ?  ?  ?  ?  ?  ?  ?  ?  ? ?  ?General Comments   ? ?  ?Exercises    ? ?Assessment/Plan  ?  ?PT Assessment Patient needs continued PT services  ?PT Problem  List Decreased activity tolerance;Decreased balance;Decreased mobility;Decreased safety awareness;Cardiopulmonary status limiting activity ? ?   ?  ?PT Treatment Interventions DME instruction;Gait training;Stair training;Functional mobility training;Therapeutic activities;Therapeutic exercise;Balance training;Patient/family education   ? ?PT Goals (Current goals can be found in the Care Plan section)  ?Acute Rehab PT Goals ?Patient Stated Goal: Want to get back home and excited to be up walking with therapist ?PT Goal Formulation: With patient/family ?Time For Goal Achievement: 05/06/22 ?Potential to Achieve Goals: Good ? ?  ?Frequency Min 3X/week ?  ? ? ?Co-evaluation   ?  ?  ?  ?  ? ? ?  ?AM-PAC PT "6 Clicks" Mobility  ?Outcome Measure Help needed turning from your back to your side while in a flat bed without using bedrails?: None ?Help needed moving from lying on your back to sitting on the side of a flat bed without using bedrails?: A Little ?Help needed moving to and from a bed to a chair (including a wheelchair)?: A Little ?Help needed standing up from a chair using your arms (e.g., wheelchair or bedside chair)?: A Little ?Help needed to walk in hospital room?: A Little ?Help needed climbing 3-5 steps with a railing? : A Little ?6 Click Score: 19 ? ?  ?  End of Session Equipment Utilized During Treatment: Gait belt;Oxygen ?Activity Tolerance: Patient tolerated treatment well;Treatment limited secondary to medical complications (Comment) (Oxygen desaturations with mobility) ?Patient left: in chair;with call bell/phone within reach;with chair alarm set;with family/visitor present ?Nurse Communication: Mobility status;Other (comment) (O2 sats) ?PT Visit Diagnosis: Unsteadiness on feet (R26.81) ?  ? ?Time: [id number] ?PT Time Calculation (min) (ACUTE ONLY): 29 min ? ? ?Charges:   PT Evaluation ?$PT Eval Low Complexity: 1 Low ?PT Treatments ?$Therapeutic Activity: 8-22 mins ?  ?   ? ? ?Lyman SpellerLauren Y., PT, DPT  ?Acute  Rehabilitation Services  ?Office [phone] ?04/22/2022, 2:36 PM ? ?

## 2022-04-22 NOTE — Evaluation (Signed)
Occupational Therapy Evaluation ?Patient Details ?Name: David Cervantes ?MRN: [id number] ?DOB: 12/11/1927 ?Today's Date: 04/22/2022 ? ? ?History of Present Illness 88 y.o. male with medical history significant of dementia, diabetes, hypertension, hyperlipidemia presenting with multiple complaints as above including fatigue, cough, rhinorrhea, malaise. Admitted with acute respiratory failure with hypoxia.  ? ?Clinical Impression ?  ?Mr. David Cervantes is a 88 year old man who presents with impaired balance, decreased activity tolerance and currently requiring 2 L [place]. On evaluation he overall needs min assist for mobility and ADLs. He is unsteady even with walker but he did improve with practice. Patient able to ambulate to toilet and perform toileting with therapist assisting with lines and hospital gown. He needs mod assist to power up from a low surface. He was able to don a sock and stand at the sink to wash his hands. His oxygen dropped to 87-89% on RA and maintained in the 90s with activity on 2 L. Overall he did well and expect he will be able to discharge home with [facility] therapy services and assistance from daughter. Patient will benefit from skilled OT services while in hospital to improve deficits and learn compensatory strategies as needed in order to return to PLOF.   ?   ? ?Recommendations for follow up therapy are one component of a multi-disciplinary discharge planning process, led by the attending physician.  Recommendations may be updated based on patient status, additional functional criteria and insurance authorization.  ? ?Follow Up Recommendations ? Home health OT  ?  ?Assistance Recommended at Discharge Frequent or constant Supervision/Assistance  ?Patient can return home with the following A little help with walking and/or transfers;A little help with bathing/dressing/bathroom;Assistance with cooking/housework;Direct supervision/assist for financial management;Direct supervision/assist for  medications management;Help with stairs or ramp for entrance ? ?  ?Functional Status Assessment ? Patient has had a recent decline in their functional status and demonstrates the ability to make significant improvements in function in a reasonable and predictable amount of time.  ?Equipment Recommendations ? None recommended by OT  ?  ?Recommendations for Other Services   ? ? ?  ?Precautions / Restrictions Precautions ?Precautions: Fall ?Precaution Comments: HOH ?Restrictions ?Weight Bearing Restrictions: No  ? ?  ? ?Mobility Bed Mobility ?Overal bed mobility: Needs Assistance ?Bed Mobility: Supine to Sit ?  ?  ?Supine to sit: Supervision ?  ?  ?General bed mobility comments: increased time ?  ? ?Transfers ?Overall transfer level: Needs assistance ?Equipment used: Rolling walker (2 wheels) ?Transfers: Sit to/from Stand, Bed to chair/wheelchair/BSC ?Sit to Stand: Min assist ?  ?  ?  ?  ?  ?General transfer comment: Min assist and verbal cues to power up, min assist for steadying. Improved with time. ?  ? ?  ?Balance Overall balance assessment: Needs assistance ?Sitting-balance support: No upper extremity supported, Feet supported ?Sitting balance-Leahy Scale: Fair ?  ?  ?Standing balance support: No upper extremity supported, During functional activity, Reliant on assistive device for balance ?Standing balance-Leahy Scale: Poor ?  ?  ?  ?  ?  ?  ?  ?  ?  ?  ?  ?  ?   ? ?ADL either performed or assessed with clinical judgement  ? ?ADL Overall ADL's : Needs assistance/impaired ?Eating/Feeding: Independent ?  ?Grooming: Wash/dry hands;Standing;Min guard ?Grooming Details (indicate cue type and reason): at sink ?Upper Body Bathing: Set up;Sitting ?  ?Lower Body Bathing: Minimal assistance;Sit to/from stand ?  ?Upper Body Dressing : Set up;Sitting ?  ?  Lower Body Dressing: Minimal assistance;Sit to/from stand ?Lower Body Dressing Details (indicate cue type and reason): to assist with pulling clothing up, able to don  socks ?Toilet Transfer: Rolling walker (2 wheels);Moderate assistance ?Toilet Transfer Details (indicate cue type and reason): to power up from low surface - needs elevated comode due to height ?Toileting- Clothing Manipulation and Hygiene: Minimal assistance;Sitting/lateral lean ?Toileting - Clothing Manipulation Details (indicate cue type and reason): assistance to manage lines, manage clothing with standing ?  ?  ?Functional mobility during ADLs: Minimal assistance;Rolling walker (2 wheels) ?General ADL Comments: Min assist for steadying.  ? ? ? ?Vision Patient Visual Report: No change from baseline ?   ?   ?Perception   ?  ?Praxis   ?  ? ?Pertinent Vitals/Pain Pain Assessment ?Pain Assessment: No/denies pain  ? ? ? ?Hand Dominance Right ?  ?Extremity/Trunk Assessment Upper Extremity Assessment ?Upper Extremity Assessment: Overall WFL for tasks assessed ?  ?Lower Extremity Assessment ?Lower Extremity Assessment: Defer to PT evaluation ?  ?Cervical / Trunk Assessment ?Cervical / Trunk Assessment: Kyphotic ?  ?Communication Communication ?Communication: HOH ?  ?Cognition Arousal/Alertness: Awake/alert ?Behavior During Therapy: [facility] for tasks assessed/performed ?Overall Cognitive Status: Within Functional Limits for tasks assessed ?  ?  ?  ?  ?  ?  ?  ?  ?  ?  ?  ?  ?  ?  ?  ?  ?  ?  ?  ?General Comments    ? ?  ?Exercises   ?  ?Shoulder Instructions    ? ? ?Home Living Family/patient expects to be discharged to:: Private residence ?Living Arrangements: Children ?Available Help at Discharge: Family;Available 24 hours/day ?Type of Home: House ?Home Access: Stairs to enter ?Entrance Stairs-Number of Steps: 5 ?Entrance Stairs-Rails: Can reach both;Left;Right ?Home Layout: One level ?  ?  ?Bathroom Shower/Tub: Tub/shower unit ?  ?Bathroom Toilet: Standard ?  ?  ?Home Equipment: BSC/3in1;Shower seat;Rolling Walker (2 wheels);Standard Walker ?  ?  ?  ? ?  ?Prior Functioning/Environment Prior Level of Function :  Independent/Modified Independent ?  ?  ?  ?  ?  ?  ?Mobility Comments: doesn't want to use a device ?ADLs Comments: trouble with buttons ?  ? ?  ?  ?OT Problem List: Decreased activity tolerance;Impaired balance (sitting and/or standing);Cardiopulmonary status limiting activity;Decreased knowledge of use of DME or AE;Decreased cognition;Decreased safety awareness ?  ?   ?OT Treatment/Interventions: Self-care/ADL training;DME and/or AE instruction;Therapeutic activities;Balance training;Patient/family education  ?  ?OT Goals(Current goals can be found in the care plan section) Acute Rehab OT Goals ?Patient Stated Goal: to walk more ?OT Goal Formulation: With patient ?Time For Goal Achievement: 05/06/22 ?Potential to Achieve Goals: Good  ?OT Frequency: Min 2X/week ?  ? ?Co-evaluation   ?  ?  ?  ?  ? ?  ?AM-PAC OT "6 Clicks" Daily Activity     ?Outcome Measure Help from another person eating meals?: None ?Help from another person taking care of personal grooming?: A Little ?Help from another person toileting, which includes using toliet, bedpan, or urinal?: A Little ?Help from another person bathing (including washing, rinsing, drying)?: A Little ?Help from another person to put on and taking off regular upper body clothing?: A Little ?Help from another person to put on and taking off regular lower body clothing?: A Little ?6 Click Score: 19 ?  ?End of Session Equipment Utilized During Treatment: Rolling walker (2 wheels);Oxygen ?Nurse Communication: Mobility status ? ?Activity Tolerance: Patient tolerated treatment  well ?Patient left: in chair;with call bell/phone within reach;with chair alarm set;with family/visitor present ? ?OT Visit Diagnosis: Unsteadiness on feet (R26.81)  ?              ?Time: [id number] ?OT Time Calculation (min): 32 min ?Charges:  OT General Charges ?$OT Visit: 1 Visit ?OT Evaluation ?$OT Eval Low Complexity: 1 Low ?OT Treatments ?$Self Care/Home Management : 8-22 mins ? ?Kambree Krauss, OTR/L ?Acute  Care Rehab Services  ?Office [phone] ?Pager: [phone]  ? ?Auriah Hollings L Litha Lamartina ?04/22/2022, 12:46 PM ?

## 2022-04-22 NOTE — Progress Notes (Signed)
?PROGRESS NOTE ? ? ? ?David Cervantes  [id number] DOB: February 27, 1927 DOA: 04/20/2022 ?PCP: [name], MD  ? ? ? ?Brief Narrative:  ?88 y.o. WM PMHx Dementia, diabetes, hypertension, hyperlipidemia presenting with multiple complaints as above including fatigue, cough, rhinorrhea, malaise. ? ?History obtained with assistance of family and chart review.  Patient has had several days of ongoing malaise, fatigue, cough and rhinorrhea, but not feeling too bad.  Started feeling significantly worse today.  He reports fevers and chills.  He had episode of nausea vomiting in route via EMS which is attributed to motion sickness. ? ?He denies chest pain, shortness of breath, abdominal pain, constipation, diarrhea. ?  ?ED Course: Vital signs in the ED significant for fever to 102.4, blood pressure in the Q000111Q to A999333 systolic, respiratory rate in the 20s to 30s saturating well on but requiring 5 L.  Lab work-up included CMP with glucose 300, calcium 7.6, protein 5.9, albumin 2.6.  CBC with normal white count but hemoglobin of 11.3 which is near baseline of 12.  Chest x-ray showed mild heart failure changes with mild interstitial edema and small effusions with apparent progression from previous. Patient received ceftriaxone, azithromycin in the ED.  Received 500 cc bolus initially but then appeared volume overloaded and received a dose of Lasix. Note had purulent sputum in the ED and this was sent for culture. ? ? ?Subjective: ?5/11 afebrile overnight A/O x4 sitting in chair comfortably.  Eating his lunch complaining today that they are not feeding him enough. ? ? ?Assessment & Plan: ?Covid vaccination; ?  ?Principal Problem: ?  Acute respiratory failure with hypoxia ([place]) ?Active Problems: ?  DM (diabetes mellitus) ([place]) ?  HTN (hypertension) ?  Acute CHF ([place]) ? ?Acute respiratory failure with hypoxia ?-DuoNeb QID ?-Solu-Medrol 60 mg BID ?-Incentive spirometry ?- Flutter valve ?-5/10 respiratory virus panel  pending ?-5/10 D5W-0.9% saline 62ml/hr ?-5/11 Mucinex DM ?-5/11 DC antibiotics patient with viral pneumonia ? ?Viral pneumonia/positive parainfluenza virus 3 ?> Patient presenting with multiple complaints noted to be in respiratory failure with hypoxia in the ED now requiring 5 L. ?> Possibly multifactorial as he has been having cough which appeared to have purulent sputum in the ED which has been sent for culture concerning for pneumonia despite lack of leukocytosis.  He was noted to have a fever to 102.4. ?> Also possibility of new onset CHF considering patient appears to volume overloaded on chest x-ray.  BNP elevated to 500. ?> Did receive a dose of Lasix in the ED as well as ceftriaxone and azithromycin. ?- Monitor on telemetry ?-Titrate O2 to maintain SPO2 >92% ?-5/11 DC antibiotics ? ? ?Acute Diastolic CHF ?- 0000000 echocardiogram consistent with diastolic CHF see findings below ?-Strict in and out ?- Daily weight ?-Lasix 20 mg BID ?- Ramipril 2.5 mg daily ?-Unable to start beta-blocker secondary to patient having multiple episodes of bradycardia. ? ?Pulmonary HTN severe ?-See CHF ?  ?Hypertension ?-See CHF ? ?DM type II ?-5/11 hemoglobin A1c pending ?- 5/11 lipid panel pending ?-5/11 increase Semglee 15 units  qhs ?-5/11 NovoLog 5 units qAC ?-5/11 increase moderate SSI ? ? ?Hypokalemia ?-Potassium goal> 4 ?- 5/11 K-Dur 40 meq ? ?Hypomagnesmia ?- Magnesium goal> 2 ?- 5/11 Magnesium IV 3 g ? ? ?  ? ? ?Mobility Assessment (last 72 hours)   ? ? Mobility Assessment   ? ? [place] Name 04/21/22 2207 04/21/22 1400  ?  ?  ?  ? Does patient have an order for bedrest  or is patient medically unstable No - Continue assessment No - Continue assessment     ? What is the highest level of mobility based on the progressive mobility assessment? Level 4 (Walks with assist in room) - Balance while marching in place and cannot step forward and back - Complete Level 4 (Walks with assist in room) - Balance while marching in place and  cannot step forward and back - Complete     ? ?  ?  ? ?  ? ? ?DVT prophylaxis: Lovenox ?Code Status: Partial ?Family Communication: 5/11 daughter at bedside for discussion of plan of care all questions answered ?Status is: Inpatient ? ? ? ?Dispo: The patient is from: Home ?             Anticipated d/c is to: Home ?             Anticipated d/c date is: > 3 days ?             Patient currently is not medically stable to d/c. ? ? ? ? ? ?Consultants:  ? ? ?Procedures/Significant Events:  ?5/10 Echocardiogram ?Left Ventricle: LVEF= 50 to 55%. The basal-to-mid septal segments appear hypokinetic.  ?- Left ventricular diastolic parameters are consistent with Grade I diastolic dysfunction  ?Right Ventricle: size is moderately enlarged.  ?-. Right ventricular systolic  ?-Severely elevated pulmonary artery systolic pressure.  ?-Estimated right ventricular systolic pressure is [id number] mmHg.  ?Tricuspid Valve: regurgitation is mild to moderate.  ? ?  ? ?I have personally reviewed and interpreted all radiology studies and my findings are as above. ? ?VENTILATOR SETTINGS: ?Nasal cannula 5/11 ?Flow 2 L/min ?SPO2 94% ? ? ?Cultures ?5/9 influenza A/B negative ?5/9 SARS coronavirus negative ?5/10 Respiratory Virus Panel positive parainfluenza virus 3 ? ? ?Antimicrobials: ?Anti-infectives (From admission, onward)  ? ? Start     Ordered Stop  ? 04/21/22 1800  cefTRIAXone (ROCEPHIN) 2 g in sodium chloride 0.9 % 100 mL IVPB       ? 04/21/22 1714    ? 04/21/22 1800  azithromycin (ZITHROMAX) 500 mg in sodium chloride 0.9 % 250 mL IVPB       ? 04/21/22 1714    ? 04/20/22 1745  cefTRIAXone (ROCEPHIN) 2 g in sodium chloride 0.9 % 100 mL IVPB       ? 04/20/22 1732 04/20/22 1825  ? 04/20/22 1745  azithromycin (ZITHROMAX) 500 mg in sodium chloride 0.9 % 250 mL IVPB       ? 04/20/22 1732 04/20/22 2150  ? ?  ?  ?   ? ? ?Devices ?  ? ?LINES / TUBES:  ? ? ? ? ?Continuous Infusions: ? azithromycin (ZITHROMAX) 500 MG IVPB (Vial-Mate [employer]) 500 mg  (04/21/22 1816)  ? cefTRIAXone (ROCEPHIN)  IV 2 g (04/21/22 1817)  ? dextrose 5 % and 0.9% NaCl 75 mL/hr at 04/22/22 [id number]  ? ? ? ?Objective: ?Vitals:  ? 04/21/22 2020 04/21/22 2023 04/22/22 0444 04/22/22 0740  ?BP: 110/72  (!) 138/56   ?Pulse: 63 (!) 53 (!) 57   ?Resp: 20 18 20    ?Temp: 97.7 ?F (36.5 ?C)  98.9 ?F (37.2 ?C)   ?TempSrc:   Oral   ?SpO2: 97% 95% 93% 94%  ?Weight:      ?Height:      ? ? ?Intake/Output Summary (Last 24 hours) at 04/22/2022 1038 ?Last data filed at 04/22/2022 0916 ?Gross per 24 hour  ?Intake 240 ml  ?Output 700 ml  ?Net -460  ml  ? ? ?Filed Weights  ? 04/20/22 2146 04/21/22 1238  ?Weight: 61.2 kg 62.2 kg  ? ? ?Examination: ? ?General: A/O x4, positive acute respiratory distress, cachectic, hard of hearing ?Eyes: negative scleral hemorrhage, negative anisocoria, negative icterus ?ENT: Negative Runny nose, negative gingival bleeding, ?Neck:  Negative scars, masses, torticollis, lymphadenopathy, JVD ?Lungs: Clear to auscultation right lobe, positive diffuse rhonchi left lobes, positive mild expiratory wheeze  ?Cardiovascular: Regular rate and rhythm without murmur gallop or rub normal S1 and S2 ?Abdomen: negative abdominal pain, nondistended, positive soft, bowel sounds, no rebound, no ascites, no appreciable mass ?Extremities: No significant cyanosis, clubbing, or edema bilateral lower extremities ?Skin: Negative rashes, lesions, ulcers ?Psychiatric:  Negative depression, negative anxiety, negative fatigue, negative mania  ?Central nervous system:  Cranial nerves II through XII intact, tongue/uvula midline, all extremities muscle strength 5/5, sensation intact throughout, negative dysarthria, negative expressive aphasia, negative receptive aphasia. ? ?.  ? ? ? ?Data Reviewed: Care during the described time interval was provided by me .  I have reviewed this patient's available data, including medical history, events of note, physical examination, and all test results as part of my  evaluation. ? ?CBC: ?Recent Labs  ?Lab 04/20/22 ?1725 04/21/22 ?0407 04/22/22 ?0429  ?WBC 9.5 9.8 6.6  ?NEUTROABS 8.5*  --  5.9  ?HGB 11.3* 12.8* 11.5*  ?HCT 33.8* 38.5* 35.8*  ?MCV 94.4 93.9 96.0  ?PLT 270 272 240  ? ? ?Basic Metabol

## 2022-04-22 NOTE — Progress Notes (Signed)
PIV consult: Pt removed 2 PIV sites. Needs frequent reminders about lines and why they need to stay in place. May benefit from using compatible IV fluids to eliminate the need for 2 PIV sites if possible. Discussed with Alexia RN. ?

## 2022-04-23 ENCOUNTER — Encounter (HOSPITAL_COMMUNITY): Payer: Self-pay | Admitting: Internal Medicine

## 2022-04-23 ENCOUNTER — Other Ambulatory Visit: Payer: Self-pay

## 2022-04-23 DIAGNOSIS — J9601 Acute respiratory failure with hypoxia: Secondary | ICD-10-CM | POA: Diagnosis not present

## 2022-04-23 DIAGNOSIS — J129 Viral pneumonia, unspecified: Secondary | ICD-10-CM | POA: Diagnosis not present

## 2022-04-23 DIAGNOSIS — J189 Pneumonia, unspecified organism: Secondary | ICD-10-CM | POA: Diagnosis not present

## 2022-04-23 DIAGNOSIS — I10 Essential (primary) hypertension: Secondary | ICD-10-CM | POA: Diagnosis not present

## 2022-04-23 LAB — CBC WITH DIFFERENTIAL/PLATELET
Abs Immature Granulocytes: 0.06 10*3/uL (ref 0.00–0.07)
Basophils Absolute: 0 10*3/uL (ref 0.0–0.1)
Basophils Relative: 0 %
Eosinophils Absolute: 0 10*3/uL (ref 0.0–0.5)
Eosinophils Relative: 0 %
HCT: 33.4 % — ABNORMAL LOW (ref 39.0–52.0)
Hemoglobin: 10.9 g/dL — ABNORMAL LOW (ref 13.0–17.0)
Immature Granulocytes: 1 %
Lymphocytes Relative: 6 %
Lymphs Abs: 0.6 10*3/uL — ABNORMAL LOW (ref 0.7–4.0)
MCH: 31 pg (ref 26.0–34.0)
MCHC: 32.6 g/dL (ref 30.0–36.0)
MCV: 94.9 fL (ref 80.0–100.0)
Monocytes Absolute: 0.4 10*3/uL (ref 0.1–1.0)
Monocytes Relative: 4 %
Neutro Abs: 9.4 10*3/uL — ABNORMAL HIGH (ref 1.7–7.7)
Neutrophils Relative %: 89 %
Platelets: 274 10*3/uL (ref 150–400)
RBC: 3.52 MIL/uL — ABNORMAL LOW (ref 4.22–5.81)
RDW: 13.8 % (ref 11.5–15.5)
WBC: 10.5 10*3/uL (ref 4.0–10.5)
nRBC: 0 % (ref 0.0–0.2)

## 2022-04-23 LAB — LIPID PANEL
Cholesterol: 111 mg/dL (ref 0–200)
HDL: 24 mg/dL — ABNORMAL LOW (ref >40)
LDL Cholesterol: 74 mg/dL (ref 0–99)
Total CHOL/HDL Ratio: 4.6 RATIO
Triglycerides: 63 mg/dL (ref <150)
VLDL: 13 mg/dL (ref 0–40)

## 2022-04-23 LAB — COMPREHENSIVE METABOLIC PANEL
ALT: 22 U/L (ref 0–44)
AST: 33 U/L (ref 15–41)
Albumin: 2.4 g/dL — ABNORMAL LOW (ref 3.5–5.0)
Alkaline Phosphatase: 77 U/L (ref 38–126)
Anion gap: 8 (ref 5–15)
BUN: 28 mg/dL — ABNORMAL HIGH (ref 8–23)
CO2: 26 mmol/L (ref 22–32)
Calcium: 8.6 mg/dL — ABNORMAL LOW (ref 8.9–10.3)
Chloride: 109 mmol/L (ref 98–111)
Creatinine, Ser: 1.49 mg/dL — ABNORMAL HIGH (ref 0.61–1.24)
GFR, Estimated: 44 mL/min — ABNORMAL LOW (ref >60)
Glucose, Bld: 127 mg/dL — ABNORMAL HIGH (ref 70–99)
Potassium: 3.8 mmol/L (ref 3.5–5.1)
Sodium: 143 mmol/L (ref 135–145)
Total Bilirubin: 0.8 mg/dL (ref 0.3–1.2)
Total Protein: 5.7 g/dL — ABNORMAL LOW (ref 6.5–8.1)

## 2022-04-23 LAB — PHOSPHORUS: Phosphorus: 3 mg/dL (ref 2.5–4.6)

## 2022-04-23 LAB — MAGNESIUM: Magnesium: 2.3 mg/dL (ref 1.7–2.4)

## 2022-04-23 LAB — HEMOGLOBIN A1C
Hgb A1c MFr Bld: 9.1 % — ABNORMAL HIGH (ref 4.8–5.6)
Mean Plasma Glucose: 214.47 mg/dL

## 2022-04-23 LAB — GLUCOSE, CAPILLARY
Glucose-Capillary: 105 mg/dL — ABNORMAL HIGH (ref 70–99)
Glucose-Capillary: 123 mg/dL — ABNORMAL HIGH (ref 70–99)
Glucose-Capillary: 147 mg/dL — ABNORMAL HIGH (ref 70–99)
Glucose-Capillary: 185 mg/dL — ABNORMAL HIGH (ref 70–99)
Glucose-Capillary: 240 mg/dL — ABNORMAL HIGH (ref 70–99)
Glucose-Capillary: 260 mg/dL — ABNORMAL HIGH (ref 70–99)

## 2022-04-23 MED ORDER — ENOXAPARIN SODIUM 30 MG/0.3ML IJ SOSY
30.0000 mg | PREFILLED_SYRINGE | INTRAMUSCULAR | Status: DC
  Administered 2022-04-23: 30 mg via SUBCUTANEOUS
  Filled 2022-04-23: qty 0.3

## 2022-04-23 NOTE — Progress Notes (Signed)
SATURATION QUALIFICATIONS: (This note is used to comply with regulatory documentation for home oxygen) ? ?Patient Saturations on Room Air at Rest = 92% ? ?Patient Saturations on Room Air while Ambulating = 88% (with just transfer to edge of bed) ? ?Patient Saturations on 3 Liters of oxygen while Ambulating = 93% ? ?Please briefly explain why patient needs home oxygen: Patient's o2  sat dropping with minimal activity. Patient's sat between 89-91% on 2L but maintains above 92% on 3 L [place]. ?

## 2022-04-23 NOTE — Progress Notes (Addendum)
Occupational Therapy Treatment ?Patient Details ?Name: David Cervantes ?MRN: [id number] ?DOB: Nov 16, 1927 ?Today's Date: 04/23/2022 ? ? ?History of present illness 88 y.o. male with medical history significant of dementia, diabetes, hypertension, hyperlipidemia presenting with multiple complaints as above including fatigue, cough, rhinorrhea, malaise. Admitted with acute respiratory failure with hypoxia. ?  ?OT comments ? Treatment focused on activity tolerance and safe mobility. Patient requesting to walk. Patient still requiring 2-3 L [place] for activity. He ambulated in hall with RW and verbal cues for technique.  Had two small Lobs to the left with ambulation in hall - of note he had mostly slept today due to being medicated last night. Still think patient can discharge home with [facility] services. Will need home o2.   ? ?Recommendations for follow up therapy are one component of a multi-disciplinary discharge planning process, led by the attending physician.  Recommendations may be updated based on patient status, additional functional criteria and insurance authorization. ?   ?Follow Up Recommendations ?    ?  ?Assistance Recommended at Discharge Frequent or constant Supervision/Assistance  ?Patient can return home with the following ? A little help with walking and/or transfers;A little help with bathing/dressing/bathroom;Assistance with cooking/housework;Direct supervision/assist for financial management;Direct supervision/assist for medications management;Help with stairs or ramp for entrance ?  ?Equipment Recommendations ? None recommended by OT  ?  ?Recommendations for Other Services   ? ?  ?Precautions / Restrictions Precautions ?Precautions: Fall ?Precaution Comments: HOH ?Restrictions ?Weight Bearing Restrictions: No  ? ? ?  ? ?Mobility Bed Mobility ?Overal bed mobility: Needs Assistance ?Bed Mobility: Supine to Sit ?  ?  ?Supine to sit: Min assist ?  ?  ?General bed mobility comments: Min assist today to  transfer to edge of bed. ?  ? ?Transfers ?Overall transfer level: Needs assistance ?Equipment used: Rolling walker (2 wheels) ?Transfers: Sit to/from Stand ?Sit to Stand: Min assist ?  ?  ?  ?  ?  ?General transfer comment: Min guard to stand and min assist to ambulate with walker in hall. 2 mild LOBs to the left. ?  ?  ?Balance Overall balance assessment: Needs assistance ?Sitting-balance support: No upper extremity supported, Feet supported ?Sitting balance-Leahy Scale: Good ?  ?  ?Standing balance support: Bilateral upper extremity supported, Reliant on assistive device for balance ?Standing balance-Leahy Scale: Poor ?  ?  ?  ?  ?  ?  ?  ?  ?  ?  ?  ?  ?   ? ?ADL either performed or assessed with clinical judgement  ? ?ADL   ?  ?  ?  ?  ?  ?  ?  ?  ?  ?  ?  ?  ?  ?  ?  ?  ?  ?  ?  ?  ?  ? ?Extremity/Trunk Assessment Upper Extremity Assessment ?Upper Extremity Assessment: Overall WFL for tasks assessed ?  ?Lower Extremity Assessment ?Lower Extremity Assessment: Defer to PT evaluation ?  ?  ?  ? ?Vision Patient Visual Report: No change from baseline ?  ?  ?Perception   ?  ?Praxis   ?  ? ?Cognition Arousal/Alertness: Awake/alert ?Behavior During Therapy: [facility] for tasks assessed/performed ?Overall Cognitive Status: Within Functional Limits for tasks assessed ?  ?  ?  ?  ?  ?  ?  ?  ?  ?  ?  ?  ?  ?  ?  ?  ?General Comments: HOH ?  ?  ?   ?  Exercises   ? ?  ?Shoulder Instructions   ? ? ?  ?General Comments Patient found on 2 L [place]. O2 sat dropped to 88% on RA with transfer on edge of bed. Ambulated on 2 L in hall and o2 sat hovered between 89-90%. Maintained around 93% on 3 L [place].  ? ? ?Pertinent Vitals/ Pain       Pain Assessment ?Pain Assessment: No/denies pain ? ?Home Living   ?  ?  ?  ?  ?  ?  ?  ?  ?  ?  ?  ?  ?  ?  ?  ?  ?  ?  ? ?  ?Prior Functioning/Environment    ?  ?  ?  ?   ? ?Frequency ? Min 2X/week  ? ? ? ? ?  ?Progress Toward Goals ? ?OT Goals(current goals can now be found in the care plan section) ?  Progress towards OT goals: Progressing toward goals ? ?Acute Rehab OT Goals ?Patient Stated Goal: be off oxygen ?OT Goal Formulation: With patient ?Time For Goal Achievement: 05/06/22 ?Potential to Achieve Goals: Good  ?Plan Discharge plan remains appropriate   ? ?Co-evaluation ? ? ?   ?  ?  ?  ?  ? ?  ?AM-PAC OT "6 Clicks" Daily Activity     ?Outcome Measure ? ? Help from another person eating meals?: None ?Help from another person taking care of personal grooming?: A Little ?Help from another person toileting, which includes using toliet, bedpan, or urinal?: A Little ?Help from another person bathing (including washing, rinsing, drying)?: A Little ?Help from another person to put on and taking off regular upper body clothing?: A Little ?Help from another person to put on and taking off regular lower body clothing?: A Little ?6 Click Score: 19 ? ?  ?End of Session Equipment Utilized During Treatment: Rolling walker (2 wheels);Oxygen ? ?OT Visit Diagnosis: Unsteadiness on feet (R26.81) ?  ?Activity Tolerance Patient tolerated treatment well ?  ?Patient Left in chair;with call bell/phone within reach;with chair alarm set;with family/visitor present ?  ?Nurse Communication Mobility status ?  ? ?   ? ?Time: 3354-5625 ?OT Time Calculation (min): 19 min ? ?Charges: OT General Charges ?$OT Visit: 1 Visit ?OT Treatments ?$Therapeutic Activity: 8-22 mins ? ?Cristiano Capri, OTR/L ?Acute Care Rehab Services  ?Office [phone] ?Pager: [phone]  ? ?Albeiro Trompeter L Lafawn Lenoir ?04/23/2022, 4:34 PM ?

## 2022-04-23 NOTE — Progress Notes (Signed)
?PROGRESS NOTE ? ? ? ?David Cervantes  [id number] DOB: August 27, 1927 DOA: 04/20/2022 ?PCP: [name], MD  ? ? ? ?Brief Narrative:  ?88 y.o. WM PMHx Dementia, diabetes, hypertension, hyperlipidemia presenting with multiple complaints as above including fatigue, cough, rhinorrhea, malaise. ? ?History obtained with assistance of family and chart review.  Patient has had several days of ongoing malaise, fatigue, cough and rhinorrhea, but not feeling too bad.  Started feeling significantly worse today.  He reports fevers and chills.  He had episode of nausea vomiting in route via EMS which is attributed to motion sickness. ? ?He denies chest pain, shortness of breath, abdominal pain, constipation, diarrhea. ?  ?ED Course: Vital signs in the ED significant for fever to 102.4, blood pressure in the Q000111Q to A999333 systolic, respiratory rate in the 20s to 30s saturating well on but requiring 5 L.  Lab work-up included CMP with glucose 300, calcium 7.6, protein 5.9, albumin 2.6.  CBC with normal white count but hemoglobin of 11.3 which is near baseline of 12.  Chest x-ray showed mild heart failure changes with mild interstitial edema and small effusions with apparent progression from previous. Patient received ceftriaxone, azithromycin in the ED.  Received 500 cc bolus initially but then appeared volume overloaded and received a dose of Lasix. Note had purulent sputum in the ED and this was sent for culture. ? ? ?Subjective: ?5/12 afebrile overnight A/O x4.  Ambulated down hallway however SPO2 dropped into the 80s    ? ? ?Assessment & Plan: ?Covid vaccination; ?  ?Principal Problem: ?  Acute respiratory failure with hypoxia ([place]) ?Active Problems: ?  DM (diabetes mellitus) ([place]) ?  HTN (hypertension) ?  Acute CHF ([place]) ? ?Acute respiratory failure with hypoxia ?-DuoNeb QID ?-Solu-Medrol 60 mg BID ?-Incentive spirometry ?- Flutter valve ?-5/10 respiratory virus panel pending ?-5/10 D5W-0.9% saline 53ml/hr ?-5/11 Mucinex  DM ?-5/11 DC antibiotics patient with viral pneumonia ? ?Viral pneumonia/positive parainfluenza virus 3 ?> Patient presenting with multiple complaints noted to be in respiratory failure with hypoxia in the ED now requiring 5 L. ?> Possibly multifactorial as he has been having cough which appeared to have purulent sputum in the ED which has been sent for culture concerning for pneumonia despite lack of leukocytosis.  He was noted to have a fever to 102.4. ?> Also possibility of new onset CHF considering patient appears to volume overloaded on chest x-ray.  BNP elevated to 500. ?> Did receive a dose of Lasix in the ED as well as ceftriaxone and azithromycin. ?- Monitor on telemetry ?-Titrate O2 to maintain SPO2 >92% ?-5/11 DC antibiotics ? ? ?Acute Diastolic CHF ?- 0000000 echocardiogram consistent with diastolic CHF see findings below ?-Strict in and out -1.1 L ?- Daily weight ?Filed Weights  ? 04/20/22 2146 04/21/22 1238  ?Weight: 61.2 kg 62.2 kg  ?-5/12 (hold) Lasix 20 mg BID: Becoming dehydrated ?- Ramipril 2.5 mg daily ?-Unable to start beta-blocker secondary to patient having multiple episodes of bradycardia. ? ?Pulmonary HTN severe ?-See CHF ?  ?Hypertension ?-See CHF ? ?DM type II uncontrolled with hyperglycemia ?-5/11 hemoglobin A1c= 9.1  ?- 5/11 LDL = 74 just slightly above goal given patient's age will not add additional medication.  -5/11 increase Semglee 15 units  qhs ?-5/11 NovoLog 5 units qAC ?-5/11 increase moderate SSI ? ?AKI ?Lab Results  ?Component Value Date  ? CREATININE 1.49 (H) 04/23/2022  ? CREATININE 1.24 04/22/2022  ? CREATININE 1.26 (H) 04/21/2022  ? CREATININE 1.00 04/20/2022  ?  CREATININE 0.90 03/25/2022  ? ? ?Hypokalemia ?-Potassium goal> 4 ?- 5/11 K-Dur 40 meq ? ?Hypomagnesmia ?- Magnesium goal> 2 ?- 5/11 Magnesium IV 3 g ? ?Agitation ?- If patient becomes agitated Haldol 0.5 mg only.  Patient very sensitive ? ? ?  ? ? ?Mobility Assessment (last 72 hours)   ? ? Mobility Assessment   ? ? [name]  Name 04/23/22 0800 04/22/22 2000 04/22/22 1300 04/22/22 1243 04/22/22 0800  ? Does patient have an order for bedrest or is patient medically unstable Yes- Bedfast (Level 1) - Complete No - Continue assessment -- -- No - Continue assessment  ? What is the highest level of mobility based on the progressive mobility assessment? Level 1 (Bedfast) - Unable to balance while sitting on edge of bed Level 5 (Walks with assist in room/hall) - Balance while stepping forward/back and can walk in room with assist - Complete Level 5 (Walks with assist in room/hall) - Balance while stepping forward/back and can walk in room with assist - Complete Level 5 (Walks with assist in room/hall) - Balance while stepping forward/back and can walk in room with assist - Complete Level 5 (Walks with assist in room/hall) - Balance while stepping forward/back and can walk in room with assist - Complete  ? ? [place] Name 04/21/22 2207 04/21/22 1400  ?  ?  ?  ? Does patient have an order for bedrest or is patient medically unstable No - Continue assessment No - Continue assessment     ? What is the highest level of mobility based on the progressive mobility assessment? Level 4 (Walks with assist in room) - Balance while marching in place and cannot step forward and back - Complete Level 4 (Walks with assist in room) - Balance while marching in place and cannot step forward and back - Complete     ? ?  ?  ? ?  ? ? ?DVT prophylaxis: Lovenox ?Code Status: Partial ?Family Communication: 5/11 daughter at bedside for discussion of plan of care all questions answered ?Status is: Inpatient ? ? ? ?Dispo: The patient is from: Home ?             Anticipated d/c is to: Home ?             Anticipated d/c date is: 2 days ?             Patient currently is not medically stable to d/c. ? ? ? ? ? ?Consultants:  ? ? ?Procedures/Significant Events:  ?5/10 Echocardiogram ?Left Ventricle: LVEF= 50 to 55%. The basal-to-mid septal segments appear hypokinetic.  ?- Left  ventricular diastolic parameters are consistent with Grade I diastolic dysfunction  ?Right Ventricle: size is moderately enlarged.  ?-. Right ventricular systolic  ?-Severely elevated pulmonary artery systolic pressure.  ?-Estimated right ventricular systolic pressure is [id number] mmHg.  ?Tricuspid Valve: regurgitation is mild to moderate.  ? ?  ? ?I have personally reviewed and interpreted all radiology studies and my findings are as above. ? ?VENTILATOR SETTINGS: ?Nasal cannula 5/12 ?Flow 2 L/min ?SPO2 97% ? ? ?Cultures ?5/9 influenza A/B negative ?5/9 SARS coronavirus negative ?5/10 Respiratory Virus Panel positive parainfluenza virus 3 ? ? ?Antimicrobials: ?Anti-infectives (From admission, onward)  ? ? Start     Ordered Stop  ? 04/21/22 1800  cefTRIAXone (ROCEPHIN) 2 g in sodium chloride 0.9 % 100 mL IVPB       ? 04/21/22 1714    ? 04/21/22 1800  azithromycin (ZITHROMAX) 500 mg  in sodium chloride 0.9 % 250 mL IVPB       ? 04/21/22 1714    ? 04/20/22 1745  cefTRIAXone (ROCEPHIN) 2 g in sodium chloride 0.9 % 100 mL IVPB       ? 04/20/22 1732 04/20/22 1825  ? 04/20/22 1745  azithromycin (ZITHROMAX) 500 mg in sodium chloride 0.9 % 250 mL IVPB       ? 04/20/22 1732 04/20/22 2150  ? ?  ?  ?   ? ? ?Devices ?  ? ?LINES / TUBES:  ? ? ? ? ?Continuous Infusions: ? dextrose 5 % and 0.9% NaCl 75 mL/hr at 04/23/22 1218  ? ? ? ?Objective: ?Vitals:  ? 04/22/22 0740 04/22/22 2154 04/23/22 0631 04/23/22 [id number]  ?BP:  (!) 141/76 (!) 142/88   ?Pulse:  70 (!) 57   ?Resp:  20 20   ?Temp:  97.8 ?F (36.6 ?C) 98.5 ?F (36.9 ?C)   ?TempSrc:  Oral    ?SpO2: 94% 100% 94% 97%  ?Weight:      ?Height:      ? ? ?Intake/Output Summary (Last 24 hours) at 04/23/2022 1522 ?Last data filed at 04/23/2022 1400 ?Gross per 24 hour  ?Intake 510.9 ml  ?Output 1400 ml  ?Net -889.1 ml  ? ? ?Filed Weights  ? 04/20/22 2146 04/21/22 1238  ?Weight: 61.2 kg 62.2 kg  ? ? ?Examination: ? ?General: A/O x4, positive acute respiratory distress, cachectic, hard of  hearing ?Eyes: negative scleral hemorrhage, negative anisocoria, negative icterus ?ENT: Negative Runny nose, negative gingival bleeding, ?Neck:  Negative scars, masses, torticollis, lymphadenopathy, JVD ?Lungs: Clear

## 2022-04-23 NOTE — Care Management Important Message (Signed)
Important Message ? ?Patient Details IM Letter placed in Patients room. ?Name: David Cervantes ?MRN: [id number] ?Date of Birth: 02-28-1927 ? ? ?Medicare Important Message Given:  Yes ? ? ? ? ?Caren Macadam ?04/23/2022, 11:56 AM ?

## 2022-04-24 DIAGNOSIS — I10 Essential (primary) hypertension: Secondary | ICD-10-CM | POA: Diagnosis not present

## 2022-04-24 DIAGNOSIS — J9601 Acute respiratory failure with hypoxia: Secondary | ICD-10-CM | POA: Diagnosis not present

## 2022-04-24 DIAGNOSIS — J189 Pneumonia, unspecified organism: Secondary | ICD-10-CM | POA: Diagnosis not present

## 2022-04-24 DIAGNOSIS — J129 Viral pneumonia, unspecified: Secondary | ICD-10-CM | POA: Diagnosis not present

## 2022-04-24 LAB — CBC WITH DIFFERENTIAL/PLATELET
Abs Immature Granulocytes: 0.13 10*3/uL — ABNORMAL HIGH (ref 0.00–0.07)
Basophils Absolute: 0 10*3/uL (ref 0.0–0.1)
Basophils Relative: 0 %
Eosinophils Absolute: 0 10*3/uL (ref 0.0–0.5)
Eosinophils Relative: 0 %
HCT: 33.8 % — ABNORMAL LOW (ref 39.0–52.0)
Hemoglobin: 11.2 g/dL — ABNORMAL LOW (ref 13.0–17.0)
Immature Granulocytes: 1 %
Lymphocytes Relative: 4 %
Lymphs Abs: 0.5 10*3/uL — ABNORMAL LOW (ref 0.7–4.0)
MCH: 31 pg (ref 26.0–34.0)
MCHC: 33.1 g/dL (ref 30.0–36.0)
MCV: 93.6 fL (ref 80.0–100.0)
Monocytes Absolute: 0.2 10*3/uL (ref 0.1–1.0)
Monocytes Relative: 2 %
Neutro Abs: 10.2 10*3/uL — ABNORMAL HIGH (ref 1.7–7.7)
Neutrophils Relative %: 93 %
Platelets: 251 10*3/uL (ref 150–400)
RBC: 3.61 MIL/uL — ABNORMAL LOW (ref 4.22–5.81)
RDW: 13.8 % (ref 11.5–15.5)
WBC: 11 10*3/uL — ABNORMAL HIGH (ref 4.0–10.5)
nRBC: 0 % (ref 0.0–0.2)

## 2022-04-24 LAB — GLUCOSE, CAPILLARY
Glucose-Capillary: 150 mg/dL — ABNORMAL HIGH (ref 70–99)
Glucose-Capillary: 170 mg/dL — ABNORMAL HIGH (ref 70–99)
Glucose-Capillary: 181 mg/dL — ABNORMAL HIGH (ref 70–99)
Glucose-Capillary: 194 mg/dL — ABNORMAL HIGH (ref 70–99)
Glucose-Capillary: 221 mg/dL — ABNORMAL HIGH (ref 70–99)
Glucose-Capillary: 224 mg/dL — ABNORMAL HIGH (ref 70–99)
Glucose-Capillary: 235 mg/dL — ABNORMAL HIGH (ref 70–99)
Glucose-Capillary: 263 mg/dL — ABNORMAL HIGH (ref 70–99)

## 2022-04-24 LAB — COMPREHENSIVE METABOLIC PANEL
ALT: 26 U/L (ref 0–44)
AST: 27 U/L (ref 15–41)
Albumin: 2.3 g/dL — ABNORMAL LOW (ref 3.5–5.0)
Alkaline Phosphatase: 77 U/L (ref 38–126)
Anion gap: 10 (ref 5–15)
BUN: 27 mg/dL — ABNORMAL HIGH (ref 8–23)
CO2: 26 mmol/L (ref 22–32)
Calcium: 8.1 mg/dL — ABNORMAL LOW (ref 8.9–10.3)
Chloride: 104 mmol/L (ref 98–111)
Creatinine, Ser: 1.25 mg/dL — ABNORMAL HIGH (ref 0.61–1.24)
GFR, Estimated: 54 mL/min — ABNORMAL LOW (ref >60)
Glucose, Bld: 220 mg/dL — ABNORMAL HIGH (ref 70–99)
Potassium: 2.7 mmol/L — CL (ref 3.5–5.1)
Sodium: 140 mmol/L (ref 135–145)
Total Bilirubin: 0.5 mg/dL (ref 0.3–1.2)
Total Protein: 5.5 g/dL — ABNORMAL LOW (ref 6.5–8.1)

## 2022-04-24 LAB — PHOSPHORUS: Phosphorus: 3.2 mg/dL (ref 2.5–4.6)

## 2022-04-24 LAB — MAGNESIUM: Magnesium: 1.9 mg/dL (ref 1.7–2.4)

## 2022-04-24 MED ORDER — ENOXAPARIN SODIUM 40 MG/0.4ML IJ SOSY
40.0000 mg | PREFILLED_SYRINGE | INTRAMUSCULAR | Status: DC
  Administered 2022-04-24: 40 mg via SUBCUTANEOUS
  Filled 2022-04-24: qty 0.4

## 2022-04-24 MED ORDER — POTASSIUM CHLORIDE CRYS ER 20 MEQ PO TBCR
40.0000 meq | EXTENDED_RELEASE_TABLET | Freq: Once | ORAL | Status: AC
  Administered 2022-04-24: 40 meq via ORAL
  Filled 2022-04-24: qty 2

## 2022-04-24 MED ORDER — POTASSIUM CHLORIDE 10 MEQ/100ML IV SOLN
10.0000 meq | INTRAVENOUS | Status: AC
  Administered 2022-04-24 (×5): 10 meq via INTRAVENOUS
  Filled 2022-04-24 (×3): qty 100

## 2022-04-24 MED ORDER — HYDRALAZINE HCL 10 MG PO TABS
10.0000 mg | ORAL_TABLET | ORAL | Status: AC
  Administered 2022-04-24: 10 mg via ORAL
  Filled 2022-04-24: qty 1

## 2022-04-24 MED ORDER — IPRATROPIUM-ALBUTEROL 0.5-2.5 (3) MG/3ML IN SOLN: 3.0000 mL | RESPIRATORY_TRACT | Status: DC | PRN

## 2022-04-24 MED ORDER — METHYLPREDNISOLONE SODIUM SUCC 125 MG IJ SOLR: 60.0000 mg | Freq: Every day | INTRAMUSCULAR | Status: DC

## 2022-04-24 MED ORDER — HYDRALAZINE HCL 20 MG/ML IJ SOLN
5.0000 mg | Freq: Three times a day (TID) | INTRAMUSCULAR | Status: DC | PRN
  Filled 2022-04-24: qty 1

## 2022-04-24 MED ORDER — POTASSIUM CHLORIDE CRYS ER 20 MEQ PO TBCR
30.0000 meq | EXTENDED_RELEASE_TABLET | Freq: Once | ORAL | Status: AC
  Administered 2022-04-24: 30 meq via ORAL
  Filled 2022-04-24: qty 1

## 2022-04-24 NOTE — Progress Notes (Signed)
SATURATION QUALIFICATIONS: (This note is used to comply with regulatory documentation for home oxygen) ? ?Patient Saturations on Room Air at Rest = 90% ? ?Patient Saturations on Room Air while Ambulating = 83% ? ?Patient Saturations on 2 Liters of oxygen while Ambulating = 90% ? ?Please briefly explain why patient needs home oxygen: Patient desaturates when ambulating. ? ?Bradd Burner, RN  ?

## 2022-04-24 NOTE — Progress Notes (Signed)
Physical Therapy Treatment ?Patient Details ?Name: David Cervantes ?MRN: [id number] ?DOB: Jan 15, 1927 ?Today's Date: 04/24/2022 ? ? ?History of Present Illness 88 y.o. male with medical history significant of dementia, diabetes, hypertension, hyperlipidemia presenting with multiple complaints as above including fatigue, cough, rhinorrhea, malaise. Admitted with acute respiratory failure with hypoxia. ? ?  ?PT Comments  ? ? Today's PT session focused on gait training with different assistive devices and monitoring of O2 saturations. Pt required MIN A for stability due to multiple LOB episodes when ambulating with quad cane. Able to progress to MIN guard-supervision with use of RW. Discussed use of assistive device upon d/c to maximize safety as pt seems to be active around his home/yard. O2 desat to 83-84% on 2L with quick recovery noted. Trialed on RA per RN request and pt with desat to 83-84% while ambulating but quick recovery to >90% with standing rest. Left on 1L  at end of session with O2 sat at 95%. Pt will benefit from continued skilled PT to increase his independence and maximize safety with mobility.  ?   ?Recommendations for follow up therapy are one component of a multi-disciplinary discharge planning process, led by the attending physician.  Recommendations may be updated based on patient status, additional functional criteria and insurance authorization. ? ?Follow Up Recommendations ? Home health PT ?  ?  ?Assistance Recommended at Discharge Frequent or constant Supervision/Assistance  ?Patient can return home with the following A little help with walking and/or transfers;Assistance with cooking/housework;Assist for transportation;Help with stairs or ramp for entrance;A little help with bathing/dressing/bathroom ?  ?Equipment Recommendations ? Rolling walker (2 wheels) (pt's daughter reports they have at home)  ?  ?Recommendations for Other Services   ? ? ?  ?Precautions / Restrictions  Precautions ?Precautions: Fall ?Precaution Comments: HOH ?Restrictions ?Weight Bearing Restrictions: No  ?  ? ?Mobility ? Bed Mobility ?  ?  ?  ?  ?  ?  ?  ?General bed mobility comments: OOB in recliner chair upon entry ?  ? ?Transfers ?Overall transfer level: Needs assistance ?Equipment used: Rolling walker (2 wheels) ?Transfers: Sit to/from Stand ?Sit to Stand: Min assist ?  ?  ?  ?  ?  ?General transfer comment: Multiple attempts tostand from recliner with use of B UEs. Cues to scoot closer to EOB and ultimately MIN A for power up from chair. ?  ? ?Ambulation/Gait ?Ambulation/Gait assistance: Min assist, Supervision ?Gait Distance (Feet): 225 Feet ?Assistive device: Quad cane, Rolling walker (2 wheels) ?Gait Pattern/deviations: Step-through pattern, Decreased stride length, Drifts right/left ?Gait velocity: decr ?  ?  ?General Gait Details: Multiple standing breaks for monitoring of oxygen saturations.Trialed ambulation with quad cane and pt requiring MIN A for stability due to multiple staggering steps and LOB episodes. Able to progress to MIN guard-supervision with use of RW and pt reporting improved stability. Discussed use of AD upon d/c to maximize safety with current balance deficits as pt is very active at home typically. O2 desat to 83-84% on 2L with good recovery noted. Trialed on RA per RN request and pt with desat to 83-84% while ambulating but quick recovery to >90%. Left on 1L with O2 sat at 95%. ? ? ?Stairs ?  ?  ?  ?  ?  ? ? ?Wheelchair Mobility ?  ? ?Modified Rankin (Stroke Patients Only) ?  ? ? ?  ?Balance Overall balance assessment: Needs assistance ?Sitting-balance support: No upper extremity supported, Feet supported ?Sitting balance-Leahy Scale: Good ?  ?  ?  Standing balance support: Single extremity supported, Bilateral upper extremity supported, During functional activity ?Standing balance-Leahy Scale: Fair ?Standing balance comment: able to stand statically with supervision and no AD ?  ?   ?  ?  ?  ?  ?  ?  ?  ?  ?  ?  ? ?  ?Cognition Arousal/Alertness: Awake/alert ?Behavior During Therapy: [facility] for tasks assessed/performed ?Overall Cognitive Status: Within Functional Limits for tasks assessed ?  ?  ?  ?  ?  ?  ?  ?  ?  ?  ?  ?  ?  ?  ?  ?  ?General Comments: HOH ?  ?  ? ?  ?Exercises   ? ?  ?General Comments   ?  ?  ? ?Pertinent Vitals/Pain Pain Assessment ?Pain Assessment: No/denies pain  ? ? ?Home Living   ?  ?  ?  ?  ?  ?  ?  ?  ?  ?   ?  ?Prior Function    ?  ?  ?   ? ?PT Goals (current goals can now be found in the care plan section) Acute Rehab PT Goals ?Patient Stated Goal: Want to get back home and excited to be up walking with therapist ?PT Goal Formulation: With patient/family ?Time For Goal Achievement: 05/06/22 ?Potential to Achieve Goals: Good ?Progress towards PT goals: Progressing toward goals ? ?  ?Frequency ? ? ? Min 3X/week ? ? ? ?  ?PT Plan Current plan remains appropriate  ? ? ?Co-evaluation   ?  ?  ?  ?  ? ?  ?AM-PAC PT "6 Clicks" Mobility   ?Outcome Measure ? Help needed turning from your back to your side while in a flat bed without using bedrails?: None ?Help needed moving from lying on your back to sitting on the side of a flat bed without using bedrails?: A Little ?Help needed moving to and from a bed to a chair (including a wheelchair)?: A Little ?Help needed standing up from a chair using your arms (e.g., wheelchair or bedside chair)?: A Little ?Help needed to walk in hospital room?: A Little ?Help needed climbing 3-5 steps with a railing? : A Little ?6 Click Score: 19 ? ?  ?End of Session Equipment Utilized During Treatment: Gait belt;Oxygen ?Activity Tolerance: Patient tolerated treatment well ?Patient left: in chair;with call bell/phone within reach;with chair alarm set;with family/visitor present ?Nurse Communication: Mobility status;Other (comment) (O2 sats) ?PT Visit Diagnosis: Unsteadiness on feet (R26.81) ?  ? ? ?Time: 3009-2330 ?PT Time Calculation (min) (ACUTE  ONLY): 29 min ? ?Charges:  $Gait Training: 8-22 mins ?$Therapeutic Activity: 8-22 mins          ?          ? ?Lyman Speller., PT, DPT  ?Acute Rehabilitation Services  ?Office [phone] ? ?04/24/2022, 12:51 PM ? ?

## 2022-04-24 NOTE — TOC Progression Note (Signed)
Transition of Care (TOC) - Progression Note  ? ? ?Patient Details  ?Name: David Cervantes ?MRN: [id number] ?Date of Birth: 11/12/1927 ? ?Transition of Care (TOC) CM/SW Contact  ?Geni Bers, RN ?Phone Number: ?04/24/2022, 2:52 PM ? ?Clinical Narrative:    ?Spoke with pt's daughter [name] concerning HH and O2. [facility] was [facility] for [facility] and [employer] for DME. Referral given to in house reps.  ? ? ?Expected Discharge Plan: Home w Home Health Services ?Barriers to Discharge: No Barriers Identified ? ?Expected Discharge Plan and Services ?Expected Discharge Plan: Home w Home Health Services ?  ?  ?Post Acute Care Choice: Home Health, Durable Medical Equipment ?Living arrangements for the past 2 months: Single Family Home ?                ?  ?  ?  ?  ?  ?HH Arranged: PT, NA, Social Work ?HH Agency: [facility] ?Date HH Agency Contacted: 04/24/22 ?Time HH Agency Contacted: 1451 ?Representative spoke with at [facility] Agency: Kandee Keen ? ? ?Social Determinants of Health (SDOH) Interventions ?  ? ?Readmission Risk Interventions ?   ? View : No data to display.  ?  ?  ?  ? ? ?

## 2022-04-24 NOTE — Progress Notes (Signed)
?PROGRESS NOTE ? ? ? ?David Cervantes  MIW:[id number] DOB: Sep 20, 1927 DOA: 04/20/2022 ?PCP: David Housekeeper, MD  ? ? ? ?Brief Narrative:  ?88 y.o. WM PMHx Dementia, diabetes, hypertension, hyperlipidemia presenting with multiple complaints as above including fatigue, cough, rhinorrhea, malaise. ? ?History obtained with assistance of family and chart review.  Patient has had several days of ongoing malaise, fatigue, cough and rhinorrhea, but not feeling too bad.  Started feeling significantly worse today.  He reports fevers and chills.  He had episode of nausea vomiting in route via EMS which is attributed to motion sickness. ? ?He denies chest pain, shortness of breath, abdominal pain, constipation, diarrhea. ?  ?ED Course: Vital signs in the ED significant for fever to 102.4, blood pressure in the 150s to 190s systolic, respiratory rate in the 20s to 30s saturating well on but requiring 5 L.  Lab work-up included CMP with glucose 300, calcium 7.6, protein 5.9, albumin 2.6.  CBC with normal white count but hemoglobin of 11.3 which is near baseline of 12.  Chest x-ray showed mild heart failure changes with mild interstitial edema and small effusions with apparent progression from previous. Patient received ceftriaxone, azithromycin in the ED.  Received 500 cc bolus initially but then appeared volume overloaded and received a dose of Lasix. Note had purulent sputum in the ED and this was sent for culture. ? ? ?Subjective: ?5/13 afebrile overnight A/O x4, patient sitting in chair eating lunch ? ? ? ?Assessment & Plan: ?Covid vaccination; ?  ?Principal Problem: ?  Acute respiratory failure with hypoxia (HCC) ?Active Problems: ?  DM (diabetes mellitus) (HCC) ?  HTN (hypertension) ?  Acute CHF (HCC) ? ?Acute respiratory failure with hypoxia ?-DuoNeb QID ?-Solu-Medrol 60 mg BID ?-Incentive spirometry ?- Flutter valve ?-5/10 respiratory virus panel pending ?-5/10 D5W-0.9% saline 69ml/hr ?-5/11 Mucinex DM ?-5/11 DC  antibiotics patient with viral pneumonia ?SATURATION QUALIFICATIONS: (This note is used to comply with regulatory documentation for home oxygen) ?Patient Saturations on Room Air at Rest = 90% ?Patient Saturations on Room Air while Ambulating = 83% ?Patient Saturations on 2 Liters of oxygen while Ambulating = 90% ?Please briefly explain why patient needs home oxygen: Patient desaturates when ambulating. ?-Patient meets criteria for home O2. ?- 2 L O2 titrate to maintain SPO2> 92% ?- Provide [employer] home O2 portable concentrator. ?-5/13 decrease Solu-Medrol 60 mg daily ? ? ?Viral pneumonia/positive parainfluenza virus 3 ?> Patient presenting with multiple complaints noted to be in respiratory failure with hypoxia in the ED now requiring 5 L. ?> Possibly multifactorial as he has been having cough which appeared to have purulent sputum in the ED which has been sent for culture concerning for pneumonia despite lack of leukocytosis.  He was noted to have a fever to 102.4. ?> Also possibility of new onset CHF considering patient appears to volume overloaded on chest x-ray.  BNP elevated to 500. ?> Did receive a dose of Lasix in the ED as well as ceftriaxone and azithromycin. ?- Monitor on telemetry ?-Titrate O2 to maintain SPO2 >92% ?-5/11 DC antibiotics ? ? ?Acute Diastolic CHF ?- 5/10 echocardiogram consistent with diastolic CHF see findings below ?-Strict in and out -1.6 L ?- Daily weight ?Filed Weights  ? 04/20/22 2146 04/21/22 1238  ?Weight: 61.2 kg 62.2 kg  ?-5/12 (hold) Lasix 20 mg BID: Becoming dehydrated ?- Ramipril 2.5 mg daily ?-Unable to start beta-blocker secondary to patient having multiple episodes of bradycardia. ? ?Pulmonary HTN severe ?-See CHF ?  ?Hypertension ?-See  CHF ? ?DM type II uncontrolled with hyperglycemia ?-5/11 hemoglobin A1c= 9.1  ?- 5/11 LDL = 74 just slightly above goal given patient's age will not add additional medication.  -5/11 increase Semglee 15 units  qhs ?-5/11 NovoLog 5 units  qAC ?-5/11 increase moderate SSI ? ?AKI ?Lab Results  ?Component Value Date  ? CREATININE 1.25 (H) 04/24/2022  ? CREATININE 1.49 (H) 04/23/2022  ? CREATININE 1.24 04/22/2022  ? CREATININE 1.26 (H) 04/21/2022  ? CREATININE 1.00 04/20/2022  ? ? ?Hypokalemia ?-Potassium goal> 4 ?- 5/13 K-Dur 30 mEq x 1 doses + Potassium IV 50 mEq ? ? ?Hypomagnesmia ?- Magnesium goal> 2 ?- 5/11 Magnesium IV 3 g ? ?Agitation ?- If patient becomes agitated Haldol 0.5 mg only.  Patient very sensitive ? ? ?  ? ? ?Mobility Assessment (last 72 hours)   ? ? Mobility Assessment   ? ? [place] Name 04/24/22 [phone] 04/24/22 0804 04/23/22 2148 04/23/22 2146 04/23/22 1633  ? Does patient have an order for bedrest or is patient medically unstable No - Continue assessment No - Continue assessment Yes- Bedfast (Level 1) - Complete Yes- Bedfast (Level 1) - Complete --  ? What is the highest level of mobility based on the progressive mobility assessment? Level 5 (Walks with assist in room/hall) - Balance while stepping forward/back and can walk in room with assist - Complete Level 5 (Walks with assist in room/hall) - Balance while stepping forward/back and can walk in room with assist - Complete Level 5 (Walks with assist in room/hall) - Balance while stepping forward/back and can walk in room with assist - Complete Level 5 (Walks with assist in room/hall) - Balance while stepping forward/back and can walk in room with assist - Complete Level 5 (Walks with assist in room/hall) - Balance while stepping forward/back and can walk in room with assist - Complete  ? ? [place] Name 04/23/22 0800 04/22/22 2000 04/22/22 1300 04/22/22 1243 04/22/22 0800  ? Does patient have an order for bedrest or is patient medically unstable Yes- Bedfast (Level 1) - Complete No - Continue assessment -- -- No - Continue assessment  ? What is the highest level of mobility based on the progressive mobility assessment? Level 1 (Bedfast) - Unable to balance while sitting on edge of bed Level 5  (Walks with assist in room/hall) - Balance while stepping forward/back and can walk in room with assist - Complete Level 5 (Walks with assist in room/hall) - Balance while stepping forward/back and can walk in room with assist - Complete Level 5 (Walks with assist in room/hall) - Balance while stepping forward/back and can walk in room with assist - Complete Level 5 (Walks with assist in room/hall) - Balance while stepping forward/back and can walk in room with assist - Complete  ? ? [place] Name 04/21/22 2207 04/21/22 1400  ?  ?  ?  ? Does patient have an order for bedrest or is patient medically unstable No - Continue assessment No - Continue assessment     ? What is the highest level of mobility based on the progressive mobility assessment? Level 4 (Walks with assist in room) - Balance while marching in place and cannot step forward and back - Complete Level 4 (Walks with assist in room) - Balance while marching in place and cannot step forward and back - Complete     ? ?  ?  ? ?  ? ? ?DVT prophylaxis: Lovenox ?Code Status: Partial ?Family Communication: 5/11 daughter at  bedside for discussion of plan of care all questions answered ?Status is: Inpatient ? ? ? ?Dispo: The patient is from: Home ?             Anticipated d/c is to: Home ?             Anticipated d/c date is: 2 days ?             Patient currently is not medically stable to d/c. ? ? ? ? ? ?Consultants:  ? ? ?Procedures/Significant Events:  ?5/10 Echocardiogram ?Left Ventricle: LVEF= 50 to 55%. The basal-to-mid septal segments appear hypokinetic.  ?- Left ventricular diastolic parameters are consistent with Grade I diastolic dysfunction  ?Right Ventricle: size is moderately enlarged.  ?-. Right ventricular systolic  ?-Severely elevated pulmonary artery systolic pressure.  ?-Estimated right ventricular systolic pressure is [id number] mmHg.  ?Tricuspid Valve: regurgitation is mild to moderate.  ? ?  ? ?I have personally reviewed and interpreted all radiology  studies and my findings are as above. ? ?VENTILATOR SETTINGS: ?Nasal cannula 5/13 ?Flow 2 L/min ?SPO2 97% ? ? ?Cultures ?5/9 influenza A/B negative ?5/9 SARS coronavirus negative ?5/10 Respiratory Virus Panel positive

## 2022-04-25 DIAGNOSIS — E1165 Type 2 diabetes mellitus with hyperglycemia: Secondary | ICD-10-CM | POA: Diagnosis not present

## 2022-04-25 DIAGNOSIS — J189 Pneumonia, unspecified organism: Secondary | ICD-10-CM | POA: Diagnosis not present

## 2022-04-25 DIAGNOSIS — I10 Essential (primary) hypertension: Secondary | ICD-10-CM | POA: Diagnosis not present

## 2022-04-25 DIAGNOSIS — J9601 Acute respiratory failure with hypoxia: Secondary | ICD-10-CM | POA: Diagnosis not present

## 2022-04-25 LAB — CBC WITH DIFFERENTIAL/PLATELET
Abs Immature Granulocytes: 0.07 10*3/uL (ref 0.00–0.07)
Basophils Absolute: 0 10*3/uL (ref 0.0–0.1)
Basophils Relative: 0 %
Eosinophils Absolute: 0 10*3/uL (ref 0.0–0.5)
Eosinophils Relative: 0 %
HCT: 35 % — ABNORMAL LOW (ref 39.0–52.0)
Hemoglobin: 11.5 g/dL — ABNORMAL LOW (ref 13.0–17.0)
Immature Granulocytes: 1 %
Lymphocytes Relative: 9 %
Lymphs Abs: 1 10*3/uL (ref 0.7–4.0)
MCH: 30.6 pg (ref 26.0–34.0)
MCHC: 32.9 g/dL (ref 30.0–36.0)
MCV: 93.1 fL (ref 80.0–100.0)
Monocytes Absolute: 0.8 10*3/uL (ref 0.1–1.0)
Monocytes Relative: 7 %
Neutro Abs: 9.4 10*3/uL — ABNORMAL HIGH (ref 1.7–7.7)
Neutrophils Relative %: 83 %
Platelets: 286 10*3/uL (ref 150–400)
RBC: 3.76 MIL/uL — ABNORMAL LOW (ref 4.22–5.81)
RDW: 13.7 % (ref 11.5–15.5)
WBC: 11.2 10*3/uL — ABNORMAL HIGH (ref 4.0–10.5)
nRBC: 0 % (ref 0.0–0.2)

## 2022-04-25 LAB — COMPREHENSIVE METABOLIC PANEL
ALT: 32 U/L (ref 0–44)
AST: 36 U/L (ref 15–41)
Albumin: 2.3 g/dL — ABNORMAL LOW (ref 3.5–5.0)
Alkaline Phosphatase: 78 U/L (ref 38–126)
Anion gap: 5 (ref 5–15)
BUN: 32 mg/dL — ABNORMAL HIGH (ref 8–23)
CO2: 28 mmol/L (ref 22–32)
Calcium: 8.2 mg/dL — ABNORMAL LOW (ref 8.9–10.3)
Chloride: 110 mmol/L (ref 98–111)
Creatinine, Ser: 1.15 mg/dL (ref 0.61–1.24)
GFR, Estimated: 60 mL/min — ABNORMAL LOW (ref >60)
Glucose, Bld: 75 mg/dL (ref 70–99)
Potassium: 3.4 mmol/L — ABNORMAL LOW (ref 3.5–5.1)
Sodium: 143 mmol/L (ref 135–145)
Total Bilirubin: 0.4 mg/dL (ref 0.3–1.2)
Total Protein: 5.2 g/dL — ABNORMAL LOW (ref 6.5–8.1)

## 2022-04-25 LAB — PHOSPHORUS: Phosphorus: 2.7 mg/dL (ref 2.5–4.6)

## 2022-04-25 LAB — CULTURE, BLOOD (ROUTINE X 2)
Culture: NO GROWTH
Culture: NO GROWTH
Special Requests: ADEQUATE

## 2022-04-25 LAB — GLUCOSE, CAPILLARY
Glucose-Capillary: 74 mg/dL (ref 70–99)
Glucose-Capillary: 65 mg/dL — ABNORMAL LOW (ref 70–99)
Glucose-Capillary: 93 mg/dL (ref 70–99)
Glucose-Capillary: 177 mg/dL — ABNORMAL HIGH (ref 70–99)

## 2022-04-25 LAB — MAGNESIUM: Magnesium: 1.8 mg/dL (ref 1.7–2.4)

## 2022-04-25 MED ORDER — POTASSIUM CHLORIDE CRYS ER 20 MEQ PO TBCR
60.0000 meq | EXTENDED_RELEASE_TABLET | Freq: Once | ORAL | Status: AC
  Administered 2022-04-25: 60 meq via ORAL
  Filled 2022-04-25: qty 3

## 2022-04-25 MED ORDER — DM-GUAIFENESIN ER 30-600 MG PO TB12
1.0000 | ORAL_TABLET | Freq: Two times a day (BID) | ORAL | 0 refills | Status: DC
Start: 2022-04-25 — End: 2022-05-20

## 2022-04-25 MED ORDER — PREDNISONE 10 MG PO TABS: 40.0000 mg | ORAL_TABLET | Freq: Every day | ORAL | 0 refills | Status: DC

## 2022-04-25 MED ORDER — IPRATROPIUM-ALBUTEROL 20-100 MCG/ACT IN AERS
1.0000 | INHALATION_SPRAY | Freq: Four times a day (QID) | RESPIRATORY_TRACT | 0 refills | Status: DC | PRN
Start: 2022-04-25 — End: 2022-07-15

## 2022-04-25 NOTE — Progress Notes (Signed)
Central monitoring confirmed that patient has been in Afib during admission.  MD notified, EKG ordered and performed. ? ?Angie Fava, RN  ?

## 2022-04-25 NOTE — Progress Notes (Signed)
Discharge instructions provided to and reviewed with patient and patient's daughter, [name].  Oxygen delivered to room.  PIV and cardiac monitoring removed.  Patient escorted to main entrance via wheelchair with belongings for transport home with daughter. ? ?Angie Fava, RN  ?

## 2022-04-25 NOTE — TOC Transition Note (Signed)
Transition of Care (TOC) - CM/SW Discharge Note ? ? ?Patient Details  ?Name: David Cervantes ?MRN: [id number] ?Date of Birth: Sep 21, 1927 ? ?Transition of Care (TOC) CM/SW Contact:  ?Daran Favaro, LCSW ?Phone Number: ?04/25/2022, 10:15 AM ? ? ?Clinical Narrative:    ?Alerted by MD that pt to be dc home today.  Confirmed with [employer] that a rechargeable O2 portable tank should be delivered room shortly.  Have alerted [facility] of dc today.  No further TOC needs. Pt and daughter aware. ? ? ?Final next level of care: Home w Home Health Services ?Barriers to Discharge: No Barriers Identified ? ? ?Patient Goals and CMS Choice ?Patient states their goals for this hospitalization and ongoing recovery are:: To get better ?CMS Medicare.gov Compare Post Acute Care list provided to:: Patient ?Choice offered to / list presented to : Patient, Adult Children ? ?Discharge Placement ?  ?           ?  ?  ?  ?  ? ?Discharge Plan and Services ?  ?  ?Post Acute Care Choice: Home Health, Durable Medical Equipment          ?DME Arranged: Oxygen ?DME Agency: [employer] ?Date DME Agency Contacted: 04/24/22 ?  ?Representative spoke with at DME Agency: Leavy Cella ?HH Arranged: PT, NA, Social Work ?HH Agency: [facility] ?Date HH Agency Contacted: 04/24/22 ?Time HH Agency Contacted: 1451 ?Representative spoke with at [facility] Agency: Kandee Keen ? ?Social Determinants of Health (SDOH) Interventions ?  ? ? ?Readmission Risk Interventions ?   ? View : No data to display.  ?  ?  ?  ? ? ? ? ? ?

## 2022-04-25 NOTE — Progress Notes (Signed)
Hypoglycemic Event ? ?CBG: 65 ? ?Treatment: 4 oz juice/soda ? ?Symptoms: None ? ?Follow-up CBG: Time: 0830 CBG Result:93 ? ?Possible Reasons for Event: Unknown ? ?Comments/MD notified: MD notified ? ? ? ?David Cervantes ? ? ?

## 2022-04-25 NOTE — Discharge Summary (Signed)
Physician Discharge Summary  ?David Cervantes [id number] DOB: [birth year] DOA: 04/20/2022 ? ?PCP: [name], MD ? ?Admit date: 04/20/2022 ?Discharge date: 04/25/2022 ? ?Time spent: 35 minutes ? ?Recommendations for Outpatient Follow-up: Acute respiratory failure with hypoxia ?-DuoNeb QID ?-Solu-Medrol 60 mg BID ?-Incentive spirometry ?- Flutter valve ?-5/10 respiratory virus panel pending ?-5/10 D5W-0.9% saline 7ml/hr ?-5/11 Mucinex DM ?-5/11 DC antibiotics David Cervantes with viral pneumonia ?SATURATION QUALIFICATIONS: (This note is used to comply with regulatory documentation for home oxygen) ?David Cervantes Saturations on Room Air at Rest = 90% ?David Cervantes Saturations on Room Air while Ambulating = 83% ?David Cervantes Saturations on 2 Liters of oxygen while Ambulating = 90% ?Please briefly explain why David Cervantes needs home oxygen: David Cervantes desaturates when ambulating. ?-David Cervantes meets criteria for home O2. ?- 2 L O2 titrate to maintain SPO2> 92% ?- Provide Inogen home O2 portable concentrator. ?-5/13 decrease Solu-Medrol 60 mg daily ?  ?  ?Viral pneumonia/positive parainfluenza virus 3 ?> David Cervantes presenting with multiple complaints noted to be in respiratory failure with hypoxia in the ED now requiring 5 L. ?> Possibly multifactorial as he has been having cough which appeared to have purulent sputum in the ED which has been sent for culture concerning for pneumonia despite lack of leukocytosis.  He was noted to have a fever to 102.4. ?> Also possibility of new onset CHF considering David Cervantes appears to volume overloaded on chest x-ray.  BNP elevated to 500. ?> Did receive a dose of Lasix in the ED as well as ceftriaxone and azithromycin. ?- Monitor on telemetry ?-Titrate O2 to maintain SPO2 >92% ?-5/11 DC antibiotics ?  ?  ?Acute Diastolic CHF ?- 0000000 echocardiogram consistent with diastolic CHF see findings below ?-Strict in and out -1.3 L ?- Daily weight ?Filed Weights  ? 04/20/22 2146 04/21/22 1238 04/25/22 0409  ?Weight: 61.2 kg  62.2 kg 64.5 kg  ?-5/12 (hold) Lasix 20 mg BID: Becoming dehydrated.  PCP to determine when/if to restart Lasix. ?- Ramipril 2.5 mg daily ?-Unable to start beta-blocker secondary to David Cervantes having multiple episodes of bradycardia. ? ?Pulmonary HTN severe ?-See CHF ?  ?Hypertension ?-See CHF ? ?DM type II uncontrolled with hyperglycemia ?-5/11 hemoglobin A1c= 9.1  ?- 5/11 LDL = 74 just slightly above goal given David Cervantes's age will not add additional medication.  -Discharged on Lantus 18 units daily ?- NovoLog.  David Cervantes was uncontrolled diabetes will allow PCP to determine when/if to start meal coverage ?-Metformin XR 500 mg BID ?  ?AKI ?Lab Results  ?Component Value Date  ? CREATININE 1.15 04/25/2022  ? CREATININE 1.25 (H) 04/24/2022  ? CREATININE 1.49 (H) 04/23/2022  ? CREATININE 1.24 04/22/2022  ? CREATININE 1.26 (H) 04/21/2022  ?-Resolved ? ?Hypokalemia ?-Potassium goal> 4 ?- K-Dur 60 mEq prior to discharge ?  ?  ?Hypomagnesmia ?- Magnesium goal> 2 ?  ?Agitation ?- If David Cervantes becomes agitated Haldol 0.5 mg only.  David Cervantes very sensitive ?-Resolved ? ? ?Discharge Diagnoses:  ?Principal Problem: ?  Acute respiratory failure with hypoxia ([place]) ?Active Problems: ?  DM (diabetes mellitus) ([place]) ?  HTN (hypertension) ?  Acute CHF ([place]) ? ? ?Discharge Condition: Stable ? ?Diet recommendation: Carb modified ? ?Filed Weights  ? 04/20/22 2146 04/21/22 1238 04/25/22 0409  ?Weight: 61.2 kg 62.2 kg 64.5 kg  ? ? ?History of present illness:  ?David Cervantes, diabetes, hypertension, hyperlipidemia presenting with multiple complaints as above including fatigue, cough, rhinorrhea, malaise. ? ?History obtained with assistance of family and chart review.  David Cervantes has had  several days of ongoing malaise, fatigue, cough and rhinorrhea, but not feeling too bad.  Started feeling significantly worse today.  He reports fevers and chills.  He had episode of nausea vomiting in route via EMS which is attributed to motion  sickness. ? ?He denies chest pain, shortness of breath, abdominal pain, constipation, diarrhea. ?  ?ED Course: Vital signs in the ED significant for fever to 102.4, blood pressure in the [id number] to A999333 systolic, respiratory rate in the 20s to 30s saturating well on but requiring 5 L.  Lab work-up included CMP with glucose 300, calcium 7.6, protein 5.9, albumin 2.6.  CBC with normal white count but hemoglobin of 11.3 which is near baseline of 12.  Chest x-ray showed mild heart failure changes with mild interstitial edema and small effusions with apparent progression from previous. David Cervantes received ceftriaxone, azithromycin in the ED.  Received 500 cc bolus initially but then appeared volume overloaded and received a dose of Lasix. Note had purulent sputum in the ED and this was sent for culture. ? ?Hospital Course:  ?See above ? ?Procedures: ? 5/10 Echocardiogram ?Left Ventricle: LVEF= 50 to 55%. The basal-to-mid septal segments appear hypokinetic.  ?- Left ventricular diastolic parameters are consistent with Grade I diastolic dysfunction  ?Right Ventricle: size is moderately enlarged.  ?-. Right ventricular systolic  ?-Severely elevated pulmonary artery systolic pressure.  ?-Estimated right ventricular systolic pressure is [id number] mmHg.  ?Tricuspid Valve: regurgitation is mild to moderate.  ? ? ?Cultures  ?5/9 influenza A/B negative ?5/9 SARS coronavirus negative ?5/10 Respiratory Virus Panel positive parainfluenza virus 3 ? ?Antibiotics ?Anti-infectives (From admission, onward)  ? ? Start     Ordered Stop  ? 04/21/22 1800  cefTRIAXone (ROCEPHIN) 2 g in sodium chloride 0.9 % 100 mL IVPB  Status:  Discontinued       ? 04/21/22 1714 04/22/22 1440  ? 04/21/22 1800  azithromycin (ZITHROMAX) 500 mg in sodium chloride 0.9 % 250 mL IVPB  Status:  Discontinued       ? 04/21/22 1714 04/22/22 1440  ? 04/20/22 1745  cefTRIAXone (ROCEPHIN) 2 g in sodium chloride 0.9 % 100 mL IVPB       ? 04/20/22 1732 04/20/22 1825  ? 04/20/22 1745   azithromycin (ZITHROMAX) 500 mg in sodium chloride 0.9 % 250 mL IVPB       ? 04/20/22 1732 04/20/22 2150  ? ?  ?  ? ? ?Discharge Exam: ?Vitals:  ? 04/24/22 0437 04/24/22 1240 04/24/22 2013 04/25/22 0409  ?BP: (!) 150/69 (!) 183/85 (!) 180/74 (!) 155/72  ?Pulse: 60 (!) 48 (!) 57 (!) 58  ?Resp: 17 20 16 16   ?Temp:  (!) 97.4 ?F (36.3 ?C) 98.3 ?F (36.8 ?C) 97.6 ?F (36.4 ?C)  ?TempSrc:  Oral Oral Oral  ?SpO2: 96% 100% 100% 97%  ?Weight:    64.5 kg  ?Height:      ? ? ?General: A/O x4, positive acute respiratory distress, cachectic, hard of hearing ?Eyes: negative scleral hemorrhage, negative anisocoria, negative icterus ?ENT: Negative Runny nose, negative gingival bleeding, ?Neck:  Negative scars, masses, torticollis, lymphadenopathy, JVD ?Lungs: Clear to auscultation right lobe, positive diffuse rhonchi left lobes, positive mild expiratory wheeze  ?Cardiovascular: Regular rate and rhythm without murmur gallop or rub normal S1 and S2 ? ?Discharge Instructions ? ? ?Allergies as of 04/25/2022   ?No Known Allergies ?  ? ?  ?Medication List  ?  ? ?STOP taking these medications   ? ?Pfizer COVID-19 Vac Bivalent injection ?  Generic drug: COVID-19 mRNA bivalent vaccine [employer]) ?  ?Pfizer-BioNT COVID-19 Vac-TriS Susp injection ?Generic drug: COVID-19 mRNA Vac-TriS [employer]) ?  ? ?  ? ?TAKE these medications   ? ?cholecalciferol 25 MCG (1000 UNIT) tablet ?Commonly known as: VITAMIN D3 ?Take 1,000 Units by mouth daily. ?  ?dextromethorphan-guaiFENesin 30-600 MG 12hr tablet ?Commonly known as: [place] DM ?Take 1 tablet by mouth 2 (two) times daily. ?  ?Ipratropium-Albuterol 20-100 MCG/ACT Aers respimat ?Commonly known as: COMBIVENT ?Inhale 1 puff into the lungs every 6 (six) hours as needed for wheezing. ?  ?Lantus SoloStar 100 UNIT/ML Solostar Pen ?Generic drug: insulin glargine ?Inject 18 Units into the skin every evening. ?  ?metFORMIN 500 MG 24 hr tablet ?Commonly known as: GLUCOPHAGE-XR ?Take 500 mg by mouth 2 (two) times  daily. ?  ?predniSONE 10 MG tablet ?Commonly known as: DELTASONE ?Take 4 tablets (40 mg total) by mouth daily with breakfast. ?  ?ramipril 2.5 MG capsule ?Commonly known as: ALTACE ?Take 2.5 mg by mouth daily. ?  ?SUPER B

## 2022-04-25 NOTE — Plan of Care (Signed)
?  Problem: Education: Goal: Ability to demonstrate management of disease process will improve Outcome: Adequate for Discharge Goal: Ability to verbalize understanding of medication therapies will improve Outcome: Adequate for Discharge Goal: Individualized Educational Video(s) Outcome: Adequate for Discharge   Problem: Activity: Goal: Capacity to carry out activities will improve Outcome: Adequate for Discharge   Problem: Cardiac: Goal: Ability to achieve and maintain adequate cardiopulmonary perfusion will improve Outcome: Adequate for Discharge   Problem: Activity: Goal: Ability to tolerate increased activity will improve Outcome: Adequate for Discharge   Problem: Clinical Measurements: Goal: Ability to maintain a body temperature in the normal range will improve Outcome: Adequate for Discharge   Problem: Respiratory: Goal: Ability to maintain adequate ventilation will improve Outcome: Adequate for Discharge Goal: Ability to maintain a clear airway will improve Outcome: Adequate for Discharge   Problem: Education: Goal: Knowledge of General Education information will improve Description: Including pain rating scale, medication(s)/side effects and non-pharmacologic comfort measures Outcome: Adequate for Discharge   Problem: Health Behavior/Discharge Planning: Goal: Ability to manage health-related needs will improve Outcome: Adequate for Discharge   Problem: Clinical Measurements: Goal: Ability to maintain clinical measurements within normal limits will improve Outcome: Adequate for Discharge Goal: Will remain free from infection Outcome: Adequate for Discharge Goal: Diagnostic test results will improve Outcome: Adequate for Discharge Goal: Respiratory complications will improve Outcome: Adequate for Discharge Goal: Cardiovascular complication will be avoided Outcome: Adequate for Discharge   Problem: Activity: Goal: Risk for activity intolerance will  decrease Outcome: Adequate for Discharge   Problem: Nutrition: Goal: Adequate nutrition will be maintained Outcome: Adequate for Discharge   Problem: Coping: Goal: Level of anxiety will decrease Outcome: Adequate for Discharge   Problem: Elimination: Goal: Will not experience complications related to bowel motility Outcome: Adequate for Discharge Goal: Will not experience complications related to urinary retention Outcome: Adequate for Discharge   Problem: Pain Managment: Goal: General experience of comfort will improve Outcome: Adequate for Discharge   Problem: Safety: Goal: Ability to remain free from injury will improve Outcome: Adequate for Discharge   Problem: Skin Integrity: Goal: Risk for impaired skin integrity will decrease Outcome: Adequate for Discharge   

## 2022-05-06 DIAGNOSIS — F0390 Unspecified dementia without behavioral disturbance: Secondary | ICD-10-CM | POA: Diagnosis not present

## 2022-05-06 DIAGNOSIS — J189 Pneumonia, unspecified organism: Secondary | ICD-10-CM | POA: Diagnosis not present

## 2022-05-06 DIAGNOSIS — E1142 Type 2 diabetes mellitus with diabetic polyneuropathy: Secondary | ICD-10-CM | POA: Diagnosis not present

## 2022-05-06 DIAGNOSIS — I272 Pulmonary hypertension, unspecified: Secondary | ICD-10-CM | POA: Diagnosis not present

## 2022-05-06 DIAGNOSIS — J9600 Acute respiratory failure, unspecified whether with hypoxia or hypercapnia: Secondary | ICD-10-CM | POA: Diagnosis not present

## 2022-05-06 DIAGNOSIS — I5030 Unspecified diastolic (congestive) heart failure: Secondary | ICD-10-CM | POA: Diagnosis not present

## 2022-05-06 DIAGNOSIS — E1165 Type 2 diabetes mellitus with hyperglycemia: Secondary | ICD-10-CM | POA: Diagnosis not present

## 2022-05-06 DIAGNOSIS — E46 Unspecified protein-calorie malnutrition: Secondary | ICD-10-CM | POA: Diagnosis not present

## 2022-05-20 ENCOUNTER — Encounter: Payer: Self-pay | Admitting: Pulmonary Disease

## 2022-05-20 ENCOUNTER — Ambulatory Visit: Payer: Medicare PPO | Admitting: Pulmonary Disease

## 2022-05-20 ENCOUNTER — Ambulatory Visit (INDEPENDENT_AMBULATORY_CARE_PROVIDER_SITE_OTHER): Payer: Medicare PPO

## 2022-05-20 VITALS — BP 112/68 | HR 58 | Ht 70.0 in | Wt 143.6 lb

## 2022-05-20 DIAGNOSIS — I272 Pulmonary hypertension, unspecified: Secondary | ICD-10-CM

## 2022-05-20 DIAGNOSIS — J189 Pneumonia, unspecified organism: Secondary | ICD-10-CM | POA: Diagnosis not present

## 2022-05-20 DIAGNOSIS — J9601 Acute respiratory failure with hypoxia: Secondary | ICD-10-CM

## 2022-05-20 DIAGNOSIS — J129 Viral pneumonia, unspecified: Secondary | ICD-10-CM

## 2022-05-20 NOTE — Progress Notes (Signed)
Synopsis: Referred in June 2023 for pulmonary hypertension by [name], MD  Subjective:   PATIENT ID: David Cervantes GENDER: male DOB: 03-23-1927, MRN: [id number]  HPI  Chief Complaint  Patient presents with   Consult    Referred by PCP for possible pulmonary HTN, found on echo from last month. States he has been doing well at home.    Bladen Bevilacqua is a 88 year old male, never smoker with DMII who is referred to pulmonary clinic for hospital follow up of viral pneumonia and acute respiratory failure.   He was admitted 5/10 to 5/14 for acute hypoxemic respiratory failure due to acute heart failure and viral pneumonia due to parainfluenza virus 3. He had ECHO done which showed EF 99991111, grade I diastolic dysfunction, RV size moderately enlarged and RV function is normal. RVSP is 62.78mmHg. He was discharged on home O2 and is currently using 2L.   He is feeling much better since discharge. He has been working with home PT and completed a 6MWT on room air and no reported desaturations. He denies any cough or fevers. His appetite remains strong. He was prescribed a diuretic by his PCP but has not started it yet. He is sleeping ok and has no nighttime awakenings. He denies orthopnea. He does have lower extremity edema. He reports a history of loud snoring but this has gone away over recent years.   He is accompanied by his daughter. He is a never smoker and no second hand smoke exposure. He is a retired Forensic psychologist.   Past Medical History:  Diagnosis Date   Diabetes mellitus without complication ([place])      No family history on file.   Social History   Socioeconomic History   Marital status: Unknown    Spouse name: Not on file   Number of children: Not on file   Years of education: Not on file   Highest education level: Not on file  Occupational History   Not on file  Tobacco Use   Smoking status: Unknown   Smokeless tobacco: Not on file  Vaping Use   Vaping Use: Never used   Substance and Sexual Activity   Alcohol use: Not Currently   Drug use: Not Currently   Sexual activity: Not Currently  Other Topics Concern   Not on file  Social History Narrative   Not on file   Social Determinants of Health   Financial Resource Strain: Not on file  Food Insecurity: Not on file  Transportation Needs: Not on file  Physical Activity: Not on file  Stress: Not on file  Social Connections: Not on file  Intimate Partner Violence: Not on file     No Known Allergies   Outpatient Medications Prior to Visit  Medication Sig Dispense Refill   cholecalciferol (VITAMIN D3) 25 MCG (1000 UNIT) tablet Take 1,000 Units by mouth daily.     Cyanocobalamin (VITAMIN B-12 PO) Take 1 tablet by mouth daily.     Ipratropium-Albuterol (COMBIVENT) 20-100 MCG/ACT AERS respimat Inhale 1 puff into the lungs every 6 (six) hours as needed for wheezing. 4 g 0   LANTUS SOLOSTAR 100 UNIT/ML Solostar Pen Inject 18 Units into the skin every evening.     metFORMIN (GLUCOPHAGE-XR) 500 MG 24 hr tablet Take 500 mg by mouth 2 (two) times daily.     ramipril (ALTACE) 2.5 MG capsule Take 2.5 mg by mouth daily.     SUPER B COMPLEX/C PO Take 1 tablet by mouth daily.  vitamin C (ASCORBIC ACID) 500 MG tablet Take 500 mg by mouth daily.     Zinc 30 MG CAPS Take 1 capsule by mouth daily.     dextromethorphan-guaiFENesin (MUCINEX DM) 30-600 MG 12hr tablet Take 1 tablet by mouth 2 (two) times daily. 30 tablet 0   predniSONE (DELTASONE) 10 MG tablet Take 4 tablets (40 mg total) by mouth daily with breakfast. 20 tablet 0   No facility-administered medications prior to visit.    Review of Systems  Constitutional:  Negative for chills, fever, malaise/fatigue and weight loss.  HENT:  Positive for congestion. Negative for sinus pain and sore throat.   Eyes: Negative.   Respiratory:  Positive for cough and sputum production. Negative for hemoptysis, shortness of breath and wheezing.   Cardiovascular:   Positive for chest pain and palpitations. Negative for orthopnea, claudication and leg swelling.  Gastrointestinal:  Negative for abdominal pain, heartburn, nausea and vomiting.  Genitourinary: Negative.   Musculoskeletal:  Negative for joint pain and myalgias.  Skin:  Positive for itching. Negative for rash.  Neurological:  Negative for weakness.  Endo/Heme/Allergies: Negative.   Psychiatric/Behavioral: Negative.     Objective:   Vitals:   05/20/22 1513  BP: 112/68  Pulse: (!) 58  SpO2: 99%  Weight: 143 lb 9.6 oz (65.1 kg)  Height: 5\' 10"  (1.778 m)    Physical Exam Constitutional:      General: He is not in acute distress. HENT:     Head: Normocephalic and atraumatic.  Eyes:     Extraocular Movements: Extraocular movements intact.     Conjunctiva/sclera: Conjunctivae normal.     Pupils: Pupils are equal, round, and reactive to light.  Cardiovascular:     Rate and Rhythm: Normal rate and regular rhythm.     Pulses: Normal pulses.     Heart sounds: Normal heart sounds. No murmur heard. Pulmonary:     Effort: Pulmonary effort is normal.     Breath sounds: Decreased air movement present. No wheezing, rhonchi or rales.  Abdominal:     General: Bowel sounds are normal.     Palpations: Abdomen is soft.  Musculoskeletal:     Right lower leg: No edema.     Left lower leg: No edema.  Lymphadenopathy:     Cervical: No cervical adenopathy.  Skin:    General: Skin is warm and dry.  Neurological:     General: No focal deficit present.     Mental Status: He is alert.  Psychiatric:        Mood and Affect: Mood normal.        Behavior: Behavior normal.        Thought Content: Thought content normal.        Judgment: Judgment normal.    CBC    Component Value Date/Time   WBC 11.2 (H) 04/25/2022 0404   RBC 3.76 (L) 04/25/2022 0404   HGB 11.5 (L) 04/25/2022 0404   HCT 35.0 (L) 04/25/2022 0404   PLT 286 04/25/2022 0404   MCV 93.1 04/25/2022 0404   MCH 30.6 04/25/2022 0404    MCHC 32.9 04/25/2022 0404   RDW 13.7 04/25/2022 0404   LYMPHSABS 1.0 04/25/2022 0404   MONOABS 0.8 04/25/2022 0404   EOSABS 0.0 04/25/2022 0404   BASOSABS 0.0 04/25/2022 0404      Latest Ref Rng & Units 04/25/2022    4:04 AM 04/24/2022    4:44 AM 04/23/2022    3:58 AM  BMP  Glucose 70 -  99 mg/dL 75  220  127   BUN 8 - 23 mg/dL 32  27  28   Creatinine 0.61 - 1.24 mg/dL 1.15  1.25  1.49   Sodium 135 - 145 mmol/L 143  140  143   Potassium 3.5 - 5.1 mmol/L 3.4  2.7  3.8   Chloride 98 - 111 mmol/L 110  104  109   CO2 22 - 32 mmol/L 28  26  26    Calcium 8.9 - 10.3 mg/dL 8.2  8.1  8.6    Chest imaging: CXR 04/20/22 Mild cardiogenic failure with mild interstitial pulmonary edema and small bilateral pleural effusions. This appears progressive since prior examination.  PFT:     No data to display          Labs:  Path:  Echo 04/21/22: EF 99991111, grade I diastolic dysfunction, RV size moderately enlarged and RV function is normal. RVSP is 62.76mmHg  Heart Catheterization:  Assessment & Plan:   Viral pneumonia - Plan: DG Chest 2 View  Pulmonary hypertension (HCC)  Acute hypoxemic respiratory failure (HCC) - Plan: Pulse oximetry, overnight  Discussion: David Cervantes is a 88 year old male, never smoker with DMII who is referred to pulmonary clinic for hospital follow up of viral pneumonia and acute respiratory failure.   Echo showed enlarged RV and elevated RVSP at 62.50mmHg but in the setting of acute hypoxemic respiratory failure due to viral pneumonia. His main risk factor for developing pulmonary hypertension appears to be sleep disordered breathing based on his history of snoring.   He has recovered well from his pneumonia and hospitalization. Based on home PT reports he does not need supplemental oxygen during the day. We will check an overnight oximetry test to determine if he needs to continue O2 at night. We will check follow up x-ray today.   Follow up in 6 weeks.    Freda Jackson, MD [facility] Office: [phone]    Current Outpatient Medications:    cholecalciferol (VITAMIN D3) 25 MCG (1000 UNIT) tablet, Take 1,000 Units by mouth daily., Disp: , Rfl:    Cyanocobalamin (VITAMIN B-12 PO), Take 1 tablet by mouth daily., Disp: , Rfl:    Ipratropium-Albuterol (COMBIVENT) 20-100 MCG/ACT AERS respimat, Inhale 1 puff into the lungs every 6 (six) hours as needed for wheezing., Disp: 4 g, Rfl: 0   LANTUS SOLOSTAR 100 UNIT/ML Solostar Pen, Inject 18 Units into the skin every evening., Disp: , Rfl:    metFORMIN (GLUCOPHAGE-XR) 500 MG 24 hr tablet, Take 500 mg by mouth 2 (two) times daily., Disp: , Rfl:    ramipril (ALTACE) 2.5 MG capsule, Take 2.5 mg by mouth daily., Disp: , Rfl:    SUPER B COMPLEX/C PO, Take 1 tablet by mouth daily., Disp: , Rfl:    vitamin C (ASCORBIC ACID) 500 MG tablet, Take 500 mg by mouth daily., Disp: , Rfl:    Zinc 30 MG CAPS, Take 1 capsule by mouth daily., Disp: , Rfl:

## 2022-05-20 NOTE — Patient Instructions (Addendum)
We will check a chest x-ray today  We will check your oxygen levels night night when sleeping without oxygen to determine if you need to continue it.   Ok to start the diuretic or fluid pill medicine from your primary care  We will follow up recommendations from the cardiology team  Follow up in 6 weeks

## 2022-05-23 NOTE — Progress Notes (Signed)
Cardiology Office Note:    Date:  05/25/2022   ID:  David Cervantes, DOB [birth year], MRN [id number]  PCP:  Georgann Housekeeper, MD  Cardiologist:  None  Electrophysiologist:  None   Referring MD: Georgann Housekeeper, MD   Chief Complaint  Patient presents with   Congestive Heart Failure    History of Present Illness:    David Cervantes is a 88 y.o. male with a hx of T2DM, hyperlipidemia, dementia, chronic diastolic heart failure, pulmonary hypertension who is referred by Dr. Donette Larry for evaluation of diastolic heart failure.  He was admitted from 5/9 through 04/25/2022 with acute respiratory failure.  Likely multifactorial, was febrile and found to have viral pneumonia as well as acute diastolic heart failure.  Echocardiogram showed EF 50 to 55%, grade 1 diastolic dysfunction, normal RV function, severely elevated pulmonary pressures (RVSP 63 mmHg), mild to moderate TR, mild MR, mild AI.  He received diuresis while admitted but was not discharged on Lasix.  Subsequently started on Lasix 20 mg daily by his PCP, along with Jardiance 10 mg daily.  However daughter reports he did not start Lasix or Jardiance yet.  Had fall recently and hit his chest, has reported some chest pain from this.  Denies any shortness of breath.  He is working with OT.  Denies any lightheadedness or syncope.  Past Medical History:  Diagnosis Date   Diabetes mellitus without complication (HCC)     No past surgical history on file.  Current Medications: Current Meds  Medication Sig   cholecalciferol (VITAMIN D3) 25 MCG (1000 UNIT) tablet Take 1,000 Units by mouth daily.   Cyanocobalamin (VITAMIN B-12 PO) Take 1 tablet by mouth daily.   LANTUS SOLOSTAR 100 UNIT/ML Solostar Pen Inject 18 Units into the skin every evening.   metFORMIN (GLUCOPHAGE-XR) 500 MG 24 hr tablet Take 500 mg by mouth 2 (two) times daily.   ramipril (ALTACE) 2.5 MG capsule Take 2.5 mg by mouth daily.   SUPER B COMPLEX/C PO Take 1 tablet by  mouth daily.   vitamin C (ASCORBIC ACID) 500 MG tablet Take 500 mg by mouth daily.   Zinc 30 MG CAPS Take 1 capsule by mouth daily.     Allergies:   Patient has no known allergies.   Social History   Socioeconomic History   Marital status: Unknown    Spouse name: Not on file   Number of children: Not on file   Years of education: Not on file   Highest education level: Not on file  Occupational History   Not on file  Tobacco Use   Smoking status: Unknown   Smokeless tobacco: Not on file  Vaping Use   Vaping Use: Never used  Substance and Sexual Activity   Alcohol use: Not Currently   Drug use: Not Currently   Sexual activity: Not Currently  Other Topics Concern   Not on file  Social History Narrative   Not on file   Social Determinants of Health   Financial Resource Strain: Not on file  Food Insecurity: Not on file  Transportation Needs: Not on file  Physical Activity: Not on file  Stress: Not on file  Social Connections: Not on file     Family History: The patient's family history is not on file.  ROS:   Please see the history of present illness.     All other systems reviewed and are negative.  EKGs/Labs/Other Studies Reviewed:    The following studies were reviewed today:  EKG:  05/24/2022: Appears sinus bradycardia with competing junctional rhythm, rate 43  Recent Labs: 04/20/2022: B Natriuretic Peptide 511.2 04/25/2022: Hemoglobin 11.5; Platelets 286 05/24/2022: ALT 55; BUN 12; Creatinine, Ser 1.05; Magnesium 1.7; Potassium 4.9; Sodium 139  Recent Lipid Panel    Component Value Date/Time   CHOL 111 04/23/2022 0358   TRIG 63 04/23/2022 0358   HDL 24 (L) 04/23/2022 0358   CHOLHDL 4.6 04/23/2022 0358   VLDL 13 04/23/2022 0358   LDLCALC 74 04/23/2022 0358    Physical Exam:    VS:  BP 120/60   Pulse (!) 44   Ht 5\' 10"  (1.778 m)   Wt 140 lb 12.8 oz (63.9 kg)   SpO2 99%   BMI 20.20 kg/m     Wt Readings from Last 3 Encounters:  05/24/22 140 lb  12.8 oz (63.9 kg)  05/20/22 143 lb 9.6 oz (65.1 kg)  04/25/22 142 lb 3.2 oz (64.5 kg)     GEN:  Well nourished, well developed in no acute distress HEENT: Normal NECK: No JVD; No carotid bruits LYMPHATICS: No lymphadenopathy CARDIAC: RRR, no murmurs, rubs, gallops RESPIRATORY:  Clear to auscultation without rales, wheezing or rhonchi  ABDOMEN: Soft, non-tender, non-distended MUSCULOSKELETAL: 2+ bilateral lower extremity edema; No deformity  SKIN: Warm and dry NEUROLOGIC:  Alert and oriented x 2 PSYCHIATRIC:  Normal affect   ASSESSMENT:    1. Bradycardia   2. Chronic diastolic heart failure (HCC)   3. Pulmonary hypertension, unspecified (HCC)   4. Essential hypertension    PLAN:    Chronic diastolic heart failure: Echocardiogram showed EF 50 to 55%, grade 1 diastolic dysfunction, normal RV function, severely elevated pulmonary pressures (RVSP 63 mmHg), mild to moderate TR, mild MR, mild AI.  Started on Lasix 20 mg daily by his PCP, along with Jardiance 10 mg daily but reports did not start taking -Appears hypervolemic on exam, recommend starting Lasix.  Will check BMET/magnesium  Bradycardia: Rhythm appears sinus bradycardia with competing junctional rhythm, rate 43.  Does not appear to be symptomatic from this.  Not on AV nodal blocking agents.  Recommend Zio patch x2 weeks for further evaluation.  Pulmonary hypertension: Severe on echocardiogram as above.  Not a candidate for invasive evaluation given age and comorbidities, including dementia  Hypertension: On ramipril 2.5 mg daily  T2DM: On insulin  RTC in 6 to 8 weeks   Medication Adjustments/Labs and Tests Ordered: Current medicines are reviewed at length with the patient today.  Concerns regarding medicines are outlined above.  Orders Placed This Encounter  Procedures   Comprehensive metabolic panel   Magnesium   LONG TERM MONITOR (3-14 DAYS)   EKG 12-Lead   No orders of the defined types were placed in this  encounter.   Patient Instructions  Medication Instructions:  The current medical regimen is effective;  continue present plan and medications.  *If you need a refill on your cardiac medications before your next appointment, please call your pharmacy*   Lab Work: CMET, MAG today   If you have labs (blood work) drawn today and your tests are completely normal, you will receive your results only by: MyChart Message (if you have MyChart) OR A paper copy in the mail If you have any lab test that is abnormal or we need to change your treatment, we will call you to review the results.   Testing/Procedures:  Christena Deem- Long Term Monitor Instructions  Your physician has requested you wear a ZIO patch monitor for  14 days.  This is a single patch monitor. [employer] supplies one patch monitor per enrollment. Additional stickers are not available. Please do not apply patch if you will be having a Nuclear Stress Test,  Echocardiogram, Cardiac CT, MRI, or Chest Xray during the period you would be wearing the  monitor. The patch cannot be worn during these tests. You cannot remove and re-apply the  ZIO XT patch monitor.  Your ZIO patch monitor will be mailed 3 day USPS to your address on file. It may take 3-5 days  to receive your monitor after you have been enrolled.  Once you have received your monitor, please review the enclosed instructions. Your monitor  has already been registered assigning a specific monitor serial # to you.  Billing and Patient Assistance Program Information  We have supplied [employer] with any of your insurance information on file for billing purposes. [employer] offers a sliding scale Patient Assistance Program for patients that do not have  insurance, or whose insurance does not completely cover the cost of the ZIO monitor.  You must apply for the Patient Assistance Program to qualify for this discounted rate.  To apply, please call [employer] at [phone], select option 4,  select option 2, ask to apply for  Patient Assistance Program. Meredeth Ide will ask your household income, and how many people  are in your household. They will quote your out-of-pocket cost based on that information.  [employer] will also be able to set up a 34-month, interest-free payment plan if needed.  Applying the monitor   Shave hair from upper left chest.  Hold abrader disc by orange tab. Rub abrader in 40 strokes over the upper left chest as  indicated in your monitor instructions.  Clean area with 4 enclosed alcohol pads. Let dry.  Apply patch as indicated in monitor instructions. Patch will be placed under collarbone on left  side of chest with arrow pointing upward.  Rub patch adhesive wings for 2 minutes. Remove white label marked "1". Remove the white  label marked "2". Rub patch adhesive wings for 2 additional minutes.  While looking in a mirror, press and release button in center of patch. A small green light will  flash 3-4 times. This will be your only indicator that the monitor has been turned on.  Do not shower for the first 24 hours. You may shower after the first 24 hours.  Press the button if you feel a symptom. You will hear a small click. Record Date, Time and  Symptom in the Patient Logbook.  When you are ready to remove the patch, follow instructions on the last 2 pages of Patient  Logbook. Stick patch monitor onto the last page of Patient Logbook.  Place Patient Logbook in the blue and white box. Use locking tab on box and tape box closed  securely. The blue and white box has prepaid postage on it. Please place it in the mailbox as  soon as possible. Your physician should have your test results approximately 7 days after the  monitor has been mailed back to [facility].  Call [facility] Customer Care at [phone] if you have questions regarding  your ZIO XT patch monitor. Call them immediately if you see an orange light blinking on your  monitor.  If your  monitor falls off in less than 4 days, contact our Monitor department at [phone].  If your monitor becomes loose or falls off after 4 days call [employer] at [phone] for  suggestions on  securing your monitor    Follow-Up: At [facility], you and your health needs are our priority.  As part of our continuing mission to provide you with exceptional heart care, we have created designated Provider Care Teams.  These Care Teams include your primary Cardiologist (physician) and Advanced Practice Providers (APPs -  Physician Assistants and Nurse Practitioners) who all work together to provide you with the care you need, when you need it.  We recommend signing up for the patient portal called "MyChart".  Sign up information is provided on this After Visit Summary.  MyChart is used to connect with patients for Virtual Visits (Telemedicine).  Patients are able to view lab/test results, encounter notes, upcoming appointments, etc.  Non-urgent messages can be sent to your provider as well.   To learn more about what you can do with MyChart, go to [email or web address].    Your next appointment:   6-8 week(s)  The format for your next appointment:   In Person  Provider:   Epifanio Lesches, MD            Signed, Little Ishikawa, MD  05/25/2022 8:57 AM    [facility]

## 2022-05-24 ENCOUNTER — Ambulatory Visit (INDEPENDENT_AMBULATORY_CARE_PROVIDER_SITE_OTHER): Payer: Medicare PPO | Admitting: Cardiology

## 2022-05-24 ENCOUNTER — Encounter: Payer: Self-pay | Admitting: Cardiology

## 2022-05-24 ENCOUNTER — Ambulatory Visit (INDEPENDENT_AMBULATORY_CARE_PROVIDER_SITE_OTHER): Payer: Medicare PPO

## 2022-05-24 VITALS — BP 120/60 | HR 44 | Ht 70.0 in | Wt 140.8 lb

## 2022-05-24 DIAGNOSIS — I10 Essential (primary) hypertension: Secondary | ICD-10-CM

## 2022-05-24 DIAGNOSIS — I272 Pulmonary hypertension, unspecified: Secondary | ICD-10-CM | POA: Diagnosis not present

## 2022-05-24 DIAGNOSIS — R001 Bradycardia, unspecified: Secondary | ICD-10-CM

## 2022-05-24 DIAGNOSIS — I5032 Chronic diastolic (congestive) heart failure: Secondary | ICD-10-CM | POA: Diagnosis not present

## 2022-05-24 NOTE — Progress Notes (Unsigned)
Enrolled for [employer] to mail a ZIO XT long term holter monitor to the patients address on file.  

## 2022-05-24 NOTE — Patient Instructions (Addendum)
Medication Instructions:  The current medical regimen is effective;  continue present plan and medications.  *If you need a refill on your cardiac medications before your next appointment, please call your pharmacy*   Lab Work: CMET, MAG today   If you have labs (blood work) drawn today and your tests are completely normal, you will receive your results only by: [place] (if you have MyChart) OR A paper copy in the mail If you have any lab test that is abnormal or we need to change your treatment, we will call you to review the results.   Testing/Procedures:  Bryn Gulling- Long Term Monitor Instructions  Your physician has requested you wear a ZIO patch monitor for 14 days.  This is a single patch monitor. [employer] supplies one patch monitor per enrollment. Additional stickers are not available. Please do not apply patch if you will be having a Nuclear Stress Test,  Echocardiogram, Cardiac CT, MRI, or Chest Xray during the period you would be wearing the  monitor. The patch cannot be worn during these tests. You cannot remove and re-apply the  ZIO XT patch monitor.  Your ZIO patch monitor will be mailed 3 day USPS to your address on file. It may take 3-5 days  to receive your monitor after you have been enrolled.  Once you have received your monitor, please review the enclosed instructions. Your monitor  has already been registered assigning a specific monitor serial # to you.  Billing and Patient Assistance Program Information  We have supplied [employer] with any of your insurance information on file for billing purposes. [employer] offers a sliding scale Patient Assistance Program for patients that do not have  insurance, or whose insurance does not completely cover the cost of the ZIO monitor.  You must apply for the Patient Assistance Program to qualify for this discounted rate.  To apply, please call [employer] at [phone], select option 4, select option 2, ask to apply for   Patient Assistance Program. Theodore Demark will ask your household income, and how many people  are in your household. They will quote your out-of-pocket cost based on that information.  [employer] will also be able to set up a 29-month, interest-free payment plan if needed.  Applying the monitor   Shave hair from upper left chest.  Hold abrader disc by orange tab. Rub abrader in 40 strokes over the upper left chest as  indicated in your monitor instructions.  Clean area with 4 enclosed alcohol pads. Let dry.  Apply patch as indicated in monitor instructions. Patch will be placed under collarbone on left  side of chest with arrow pointing upward.  Rub patch adhesive wings for 2 minutes. Remove white label marked "1". Remove the white  label marked "2". Rub patch adhesive wings for 2 additional minutes.  While looking in a mirror, press and release button in center of patch. A small green light will  flash 3-4 times. This will be your only indicator that the monitor has been turned on.  Do not shower for the first 24 hours. You may shower after the first 24 hours.  Press the button if you feel a symptom. You will hear a small click. Record Date, Time and  Symptom in the Patient Logbook.  When you are ready to remove the patch, follow instructions on the last 2 pages of Patient  Logbook. Stick patch monitor onto the last page of Patient Logbook.  Place Patient Logbook in the blue and white box. Use  locking tab on box and tape box closed  securely. The blue and white box has prepaid postage on it. Please place it in the mailbox as  soon as possible. Your physician should have your test results approximately 7 days after the  monitor has been mailed back to [facility].  Call [place] at [phone] if you have questions regarding  your ZIO XT patch monitor. Call them immediately if you see an orange light blinking on your  monitor.  If your monitor falls off in less than 4  days, contact our Monitor department at [phone].  If your monitor becomes loose or falls off after 4 days call [employer] at [phone] for  suggestions on securing your monitor    Follow-Up: At [facility], you and your health needs are our priority.  As part of our continuing mission to provide you with exceptional heart care, we have created designated Provider Care Teams.  These Care Teams include your primary Cardiologist (physician) and Advanced Practice Providers (APPs -  Physician Assistants and Nurse Practitioners) who all work together to provide you with the care you need, when you need it.  We recommend signing up for the patient portal called "MyChart".  Sign up information is provided on this After Visit Summary.  MyChart is used to connect with patients for Virtual Visits (Telemedicine).  Patients are able to view lab/test results, encounter notes, upcoming appointments, etc.  Non-urgent messages can be sent to your provider as well.   To learn more about what you can do with MyChart, go to [email or web address].    Your next appointment:   6-8 week(s)  The format for your next appointment:   In Person  Provider:   Oswaldo Milian, MD

## 2022-05-25 ENCOUNTER — Telehealth: Payer: Self-pay | Admitting: Cardiology

## 2022-05-25 DIAGNOSIS — I5032 Chronic diastolic (congestive) heart failure: Secondary | ICD-10-CM

## 2022-05-25 DIAGNOSIS — I10 Essential (primary) hypertension: Secondary | ICD-10-CM

## 2022-05-25 DIAGNOSIS — R7989 Other specified abnormal findings of blood chemistry: Secondary | ICD-10-CM

## 2022-05-25 DIAGNOSIS — Z79899 Other long term (current) drug therapy: Secondary | ICD-10-CM

## 2022-05-25 LAB — COMPREHENSIVE METABOLIC PANEL
ALT: 55 IU/L — ABNORMAL HIGH (ref 0–44)
AST: 64 IU/L — ABNORMAL HIGH (ref 0–40)
Albumin/Globulin Ratio: 1 — ABNORMAL LOW (ref 1.2–2.2)
Albumin: 3.1 g/dL — ABNORMAL LOW (ref 3.5–4.6)
Alkaline Phosphatase: 193 IU/L — ABNORMAL HIGH (ref 44–121)
BUN/Creatinine Ratio: 11 (ref 10–24)
BUN: 12 mg/dL (ref 10–36)
Bilirubin Total: 0.3 mg/dL (ref 0.0–1.2)
CO2: 25 mmol/L (ref 20–29)
Calcium: 8.1 mg/dL — ABNORMAL LOW (ref 8.6–10.2)
Chloride: 99 mmol/L (ref 96–106)
Creatinine, Ser: 1.05 mg/dL (ref 0.76–1.27)
Globulin, Total: 3 g/dL (ref 1.5–4.5)
Glucose: 220 mg/dL — ABNORMAL HIGH (ref 70–99)
Potassium: 4.9 mmol/L (ref 3.5–5.2)
Sodium: 139 mmol/L (ref 134–144)
Total Protein: 6.1 g/dL (ref 6.0–8.5)
eGFR: 67 mL/min/{1.73_m2} (ref >59)

## 2022-05-25 LAB — MAGNESIUM: Magnesium: 1.7 mg/dL (ref 1.6–2.3)

## 2022-05-25 MED ORDER — MAGNESIUM OXIDE 400 MG PO TABS: 400.0000 mg | ORAL_TABLET | Freq: Every day | ORAL | 3 refills | Status: AC

## 2022-05-25 NOTE — Telephone Encounter (Signed)
Little Ishikawa, MD  05/25/2022  5:57 AM EDT     Stable kidney function, potassium has improved.  Magnesium is mildly decreased.  Can start Lasix 20 mg daily along with potassium supplement as prescribed by his PCP.  Would also add magnesium oxide 400 mg daily.  Recommend recheck BMET/magnesium in [redacted] week along with BNP.  Would also recommend monitoring daily weights and let us know if gains more than 3 pounds in 1 day and 5 pounds in 1 week.   Liver function tests are mildly elevated, though albumin has improved.  Recommend right upper quadrant ultrasound and follow-up with PCP    Daughter aware (ok per DPR) of results and recommendations.  Rx sent to pharmacy. Lab orders placed for repeat in 1 week.   Order placed for RUQ [place].

## 2022-05-25 NOTE — Telephone Encounter (Signed)
Pt's daughter returning nurses call regarding test results. Please advise

## 2022-05-28 ENCOUNTER — Telehealth: Payer: Self-pay | Admitting: Pulmonary Disease

## 2022-05-28 NOTE — Telephone Encounter (Signed)
Called patients emergency contact and explained that the order for the ONO was placed on 05/20/22 and we are just waiting for a DME company to pick u the order and they will be in contact with her after that. She verbalized understanding. Nothing further needed

## 2022-06-02 ENCOUNTER — Other Ambulatory Visit: Payer: Medicare PPO

## 2022-06-23 ENCOUNTER — Telehealth: Payer: Self-pay | Admitting: Pulmonary Disease

## 2022-06-23 NOTE — Telephone Encounter (Signed)
Called and spoke to [place] at [place] about the ONO. Jodelle Green states that the ONO has not even been done at this time.   Called patient to let him know that we can not discontinue oxygen until the ono is done and we make sure his oxygen levels are passing. Patient verbalized understanding. Patient was a little ill that its been over a month and nothing has been done. Nothing further needed

## 2022-06-25 DIAGNOSIS — R001 Bradycardia, unspecified: Secondary | ICD-10-CM

## 2022-07-05 ENCOUNTER — Encounter: Payer: Self-pay | Admitting: Pulmonary Disease

## 2022-07-05 ENCOUNTER — Ambulatory Visit: Payer: Medicare PPO | Admitting: Pulmonary Disease

## 2022-07-05 VITALS — BP 160/60 | HR 42 | Temp 97.8°F | Ht 70.0 in | Wt 128.6 lb

## 2022-07-05 DIAGNOSIS — J9601 Acute respiratory failure with hypoxia: Secondary | ICD-10-CM | POA: Diagnosis not present

## 2022-07-05 DIAGNOSIS — R600 Localized edema: Secondary | ICD-10-CM | POA: Diagnosis not present

## 2022-07-05 NOTE — Patient Instructions (Addendum)
We will get the overnight oximitry test scheduled for you this week or next  Continue to use 2L of oxygen at night until the test is completed. You will not wear oxygen the night of the test  We will check lower extremity ultrasound to rule out blood clot in your legs  Follow up in 2 months

## 2022-07-05 NOTE — Progress Notes (Signed)
Synopsis: Referred in June 2023 for pulmonary hypertension by Georgann Housekeeper, MD  Subjective:   PATIENT ID: David Cervantes GENDER: male DOB: 1927-08-12, MRN: [id number]  HPI  Chief Complaint  Patient presents with   Follow-up    Follow-up    Seab Axel is a 88 year old male, never smoker with DMII who returns to pulmonary clinic for follow up of viral pneumonia and acute respiratory failure.   He is feeling at baseline. He has stopped using his oxygen at night. He reports increased swelling of his lower extremities. His daughter has accompanied him today.  OV 05/20/22 He was admitted 5/10 to 5/14 for acute hypoxemic respiratory failure due to acute heart failure and viral pneumonia due to parainfluenza virus 3. He had ECHO done which showed EF 50-55%, grade I diastolic dysfunction, RV size moderately enlarged and RV function is normal. RVSP is 62.42mmHg. He was discharged on home O2 and is currently using 2L.   He is feeling much better since discharge. He has been working with home PT and completed a on room air and no reported desaturations. He denies any cough or fevers. His appetite remains strong. He was prescribed a diuretic by his PCP but has not started it yet. He is sleeping ok and has no nighttime awakenings. He denies orthopnea. He does have lower extremity edema. He reports a history of loud snoring but this has gone away over recent years.   He is accompanied by his daughter. He is a never smoker and no second hand smoke exposure. He is a retired [employer] consultant.   Past Medical History:  Diagnosis Date   Diabetes mellitus without complication (HCC)      No family history on file.   Social History   Socioeconomic History   Marital status: Unknown    Spouse name: Not on file   Number of children: Not on file   Years of education: Not on file   Highest education level: Not on file  Occupational History   Not on file  Tobacco Use   Smoking status: Unknown    Smokeless tobacco: Not on file  Vaping Use   Vaping Use: Never used  Substance and Sexual Activity   Alcohol use: Not Currently   Drug use: Not Currently   Sexual activity: Not Currently  Other Topics Concern   Not on file  Social History Narrative   Not on file   Social Determinants of Health   Financial Resource Strain: Not on file  Food Insecurity: Not on file  Transportation Needs: Not on file  Physical Activity: Not on file  Stress: Not on file  Social Connections: Not on file  Intimate Partner Violence: Not on file     No Known Allergies   Outpatient Medications Prior to Visit  Medication Sig Dispense Refill   cholecalciferol (VITAMIN D3) 25 MCG (1000 UNIT) tablet Take 1,000 Units by mouth daily.     Cyanocobalamin (VITAMIN B-12 PO) Take 1 tablet by mouth daily.     furosemide (LASIX) 20 MG tablet Take 20 mg by mouth daily. (Patient not taking: Reported on 05/24/2022)     Ipratropium-Albuterol (COMBIVENT) 20-100 MCG/ACT AERS respimat Inhale 1 puff into the lungs every 6 (six) hours as needed for wheezing. (Patient not taking: Reported on 05/24/2022) 4 g 0   LANTUS SOLOSTAR 100 UNIT/ML Solostar Pen Inject 18 Units into the skin every evening.     magnesium oxide (MAG-OX) 400 MG tablet Take 1 tablet (400  mg total) by mouth daily. 30 tablet 3   metFORMIN (GLUCOPHAGE-XR) 500 MG 24 hr tablet Take 500 mg by mouth 2 (two) times daily.     potassium chloride (KLOR-CON M) 10 MEQ tablet Take 10 mEq by mouth daily. (Patient not taking: Reported on 05/24/2022)     ramipril (ALTACE) 2.5 MG capsule Take 2.5 mg by mouth daily.     SUPER B COMPLEX/C PO Take 1 tablet by mouth daily.     vitamin C (ASCORBIC ACID) 500 MG tablet Take 500 mg by mouth daily.     Zinc 30 MG CAPS Take 1 capsule by mouth daily.     No facility-administered medications prior to visit.    Review of Systems  Constitutional:  Negative for chills, fever, malaise/fatigue and weight loss.  HENT:  Negative for  congestion, sinus pain and sore throat.   Eyes: Negative.   Respiratory:  Negative for cough, hemoptysis, sputum production, shortness of breath and wheezing.   Cardiovascular:  Positive for leg swelling. Negative for chest pain, palpitations, orthopnea and claudication.  Gastrointestinal:  Negative for abdominal pain, heartburn, nausea and vomiting.  Genitourinary: Negative.   Musculoskeletal:  Negative for joint pain and myalgias.  Skin:  Negative for itching and rash.  Neurological:  Negative for weakness.  Endo/Heme/Allergies: Negative.   Psychiatric/Behavioral: Negative.     Objective:   Vitals:   07/05/22 1530  BP: (!) 160/60  Pulse: (!) 42  Temp: 97.8 F (36.6 C)  TempSrc: Oral  SpO2: 100%  Weight: 128 lb 9.6 oz (58.3 kg)  Height: 5\' 10"  (1.778 m)    Physical Exam Constitutional:      General: He is not in acute distress. HENT:     Head: Normocephalic and atraumatic.  Cardiovascular:     Rate and Rhythm: Normal rate and regular rhythm.     Pulses: Normal pulses.     Heart sounds: Normal heart sounds. No murmur heard. Pulmonary:     Effort: Pulmonary effort is normal.     Breath sounds: Decreased air movement present. No wheezing, rhonchi or rales.  Musculoskeletal:     Right lower leg: Edema present.     Left lower leg: Edema present.  Skin:    General: Skin is warm and dry.  Neurological:     General: No focal deficit present.     Mental Status: He is alert.  Psychiatric:        Mood and Affect: Mood normal.        Behavior: Behavior normal.        Thought Content: Thought content normal.        Judgment: Judgment normal.    CBC    Component Value Date/Time   WBC 11.2 (H) 04/25/2022 0404   RBC 3.76 (L) 04/25/2022 0404   HGB 11.5 (L) 04/25/2022 0404   HCT 35.0 (L) 04/25/2022 0404   PLT 286 04/25/2022 0404   MCV 93.1 04/25/2022 0404   MCH 30.6 04/25/2022 0404   MCHC 32.9 04/25/2022 0404   RDW 13.7 04/25/2022 0404   LYMPHSABS 1.0 04/25/2022 0404    MONOABS 0.8 04/25/2022 0404   EOSABS 0.0 04/25/2022 0404   BASOSABS 0.0 04/25/2022 0404      Latest Ref Rng & Units 05/24/2022    4:47 PM 04/25/2022    4:04 AM 04/24/2022    4:44 AM  BMP  Glucose 70 - 99 mg/dL 04/26/2022  75  235   BUN 10 - 36 mg/dL 12  32  27   Creatinine 0.76 - 1.27 mg/dL 1.05  1.15  1.25   BUN/Creat Ratio 10 - 24 11     Sodium 134 - 144 mmol/L 139  143  140   Potassium 3.5 - 5.2 mmol/L 4.9  3.4  2.7   Chloride 96 - 106 mmol/L 99  110  104   CO2 20 - 29 mmol/L 25  28  26    Calcium 8.6 - 10.2 mg/dL 8.1  8.2  8.1    Chest imaging: CXR 04/20/22 Mild cardiogenic failure with mild interstitial pulmonary edema and small bilateral pleural effusions. This appears progressive since prior examination.  PFT:     No data to display          Labs:  Path:  Echo 04/21/22: EF [id number], grade I diastolic dysfunction, RV size moderately enlarged and RV function is normal. RVSP is 62.75mmHg  Heart Catheterization:  Assessment & Plan:   Lower extremity edema - Plan: VAS [place] LOWER EXTREMITY VENOUS (DVT)  Acute hypoxemic respiratory failure (HCC) - Plan: Pulse oximetry, overnight  Discussion: Heshy Aftab is a 88 year old male, never smoker with DMII who returns to pulmonary clinic for follow up of viral pneumonia and acute respiratory failure.   Echo showed enlarged RV and elevated RVSP at 62.32mmHg but in the setting of acute hypoxemic respiratory failure due to viral pneumonia. His main risk factor for developing pulmonary hypertension appears to be sleep disordered breathing based on his history of snoring. We will check overnight oximetry testing to determine if he still needs oxygen therapy. We will check lower extremity US to rule out DVT due to significant swelling on exam today.  Follow up in 2 months   Freda Jackson, MD [facility] Office: [phone]    Current Outpatient Medications:    cholecalciferol (VITAMIN D3) 25 MCG (1000 UNIT)  tablet, Take 1,000 Units by mouth daily., Disp: , Rfl:    Cyanocobalamin (VITAMIN B-12 PO), Take 1 tablet by mouth daily., Disp: , Rfl:    furosemide (LASIX) 20 MG tablet, Take 20 mg by mouth daily. (Patient not taking: Reported on 05/24/2022), Disp: , Rfl:    Ipratropium-Albuterol (COMBIVENT) 20-100 MCG/ACT AERS respimat, Inhale 1 puff into the lungs every 6 (six) hours as needed for wheezing. (Patient not taking: Reported on 05/24/2022), Disp: 4 g, Rfl: 0   LANTUS SOLOSTAR 100 UNIT/ML Solostar Pen, Inject 18 Units into the skin every evening., Disp: , Rfl:    magnesium oxide (MAG-OX) 400 MG tablet, Take 1 tablet (400 mg total) by mouth daily., Disp: 30 tablet, Rfl: 3   metFORMIN (GLUCOPHAGE-XR) 500 MG 24 hr tablet, Take 500 mg by mouth 2 (two) times daily., Disp: , Rfl:    potassium chloride (KLOR-CON M) 10 MEQ tablet, Take 10 mEq by mouth daily. (Patient not taking: Reported on 05/24/2022), Disp: , Rfl:    ramipril (ALTACE) 2.5 MG capsule, Take 2.5 mg by mouth daily., Disp: , Rfl:    SUPER B COMPLEX/C PO, Take 1 tablet by mouth daily., Disp: , Rfl:    vitamin C (ASCORBIC ACID) 500 MG tablet, Take 500 mg by mouth daily., Disp: , Rfl:    Zinc 30 MG CAPS, Take 1 capsule by mouth daily., Disp: , Rfl:

## 2022-07-07 ENCOUNTER — Encounter (HOSPITAL_COMMUNITY): Payer: Medicare PPO

## 2022-07-11 ENCOUNTER — Encounter: Payer: Self-pay | Admitting: Pulmonary Disease

## 2022-07-12 ENCOUNTER — Ambulatory Visit (HOSPITAL_COMMUNITY)
Admission: RE | Admit: 2022-07-12 | Discharge: 2022-07-12 | Disposition: A | Discharge status: Home / Self Care | Payer: Medicare PPO | Source: Ambulatory Visit | Attending: Pulmonary Disease | Admitting: Pulmonary Disease

## 2022-07-12 DIAGNOSIS — R600 Localized edema: Secondary | ICD-10-CM | POA: Diagnosis not present

## 2022-07-12 NOTE — Progress Notes (Unsigned)
Cardiology Office Note:    Date:  07/15/2022   ID:  David Cervantes, DOB November 05, 1927, MRN [id number]  PCP:  [name], MD  Cardiologist:  Donato Heinz, MD  Electrophysiologist:  None   Referring MD: [name], MD   Chief Complaint  Patient presents with   Bradycardia    History of Present Illness:    David Cervantes is a 88 y.o. male with a hx of T2DM, hyperlipidemia, dementia, chronic diastolic heart failure, pulmonary hypertension who presents for follow-up.  He was referred by Dr. Lysle Rubens for evaluation of diastolic heart failure, initially seen on 05/24/2022.  He was admitted from 5/9 through 04/25/2022 with acute respiratory failure.  Likely multifactorial, was febrile and found to have viral pneumonia as well as acute diastolic heart failure.  Echocardiogram showed EF 50 to [id number], grade 1 diastolic dysfunction, normal RV function, severely elevated pulmonary pressures (RVSP 63 mmHg), mild to moderate TR, mild MR, mild AI.  He received diuresis while admitted but was not discharged on Lasix.  Subsequently started on Lasix 20 mg daily by his PCP, along with Jardiance 10 mg daily.  However daughter reports he did not start Lasix or Jardiance yet.  Had fall recently and hit his chest, has reported some chest pain from this.  Denies any shortness of breath.  He is working with OT.  Denies any lightheadedness or syncope.  Since last clinic visit, he reports that he is doing okay.  He denies any chest pain, dyspnea, or palpitations.  Daughter reports that he seems more fatigued and is sleeping more than usual.  Continues to have swelling in his legs.   Wt Readings from Last 3 Encounters:  07/15/22 123 lb 6.4 oz (56 kg)  07/05/22 128 lb 9.6 oz (58.3 kg)  05/24/22 140 lb 12.8 oz (63.9 kg)      Past Medical History:  Diagnosis Date   Diabetes mellitus without complication ([place])     History reviewed. No pertinent surgical history.  Current Medications: Current  Meds  Medication Sig   cholecalciferol (VITAMIN D3) 25 MCG (1000 UNIT) tablet Take 1,000 Units by mouth daily.   Cyanocobalamin (VITAMIN B-12 PO) Take 1 tablet by mouth daily.   furosemide (LASIX) 20 MG tablet Take 20 mg by mouth daily.   LANTUS SOLOSTAR 100 UNIT/ML Solostar Pen Inject 18 Units into the skin every evening.   magnesium oxide (MAG-OX) 400 MG tablet Take 1 tablet (400 mg total) by mouth daily.   metFORMIN (GLUCOPHAGE-XR) 500 MG 24 hr tablet Take 500 mg by mouth 2 (two) times daily.   potassium chloride (KLOR-CON M) 10 MEQ tablet Take 10 mEq by mouth daily.   ramipril (ALTACE) 2.5 MG capsule Take 2.5 mg by mouth daily.   SUPER B COMPLEX/C PO Take 1 tablet by mouth daily.   vitamin C (ASCORBIC ACID) 500 MG tablet Take 500 mg by mouth daily.   Zinc 30 MG CAPS Take 1 capsule by mouth daily.     Allergies:   Patient has no known allergies.   Social History   Socioeconomic History   Marital status: Unknown    Spouse name: Not on file   Number of children: Not on file   Years of education: Not on file   Highest education level: Not on file  Occupational History   Not on file  Tobacco Use   Smoking status: Unknown   Smokeless tobacco: Not on file  Vaping Use   Vaping Use: Never used  Substance and Sexual Activity   Alcohol use: Not Currently   Drug use: Not Currently   Sexual activity: Not Currently  Other Topics Concern   Not on file  Social History Narrative   Not on file   Social Determinants of Health   Financial Resource Strain: Not on file  Food Insecurity: Not on file  Transportation Needs: Not on file  Physical Activity: Not on file  Stress: Not on file  Social Connections: Not on file     Family History: The patient's family history is not on file.  ROS:   Please see the history of present illness.     All other systems reviewed and are negative.  EKGs/Labs/Other Studies Reviewed:    The following studies were reviewed today:   EKG:   07/15/22: Rhythm strip with CHB, junctional escape in 40s 05/24/2022: Appears sinus bradycardia with competing junctional rhythm, rate 43  Recent Labs: 04/20/2022: B Natriuretic Peptide 511.2 04/25/2022: Hemoglobin 11.5; Platelets 286 05/24/2022: ALT 55; BUN 12; Creatinine, Ser 1.05; Magnesium 1.7; Potassium 4.9; Sodium 139  Recent Lipid Panel    Component Value Date/Time   CHOL 111 04/23/2022 0358   TRIG 63 04/23/2022 0358   HDL 24 (L) 04/23/2022 0358   CHOLHDL 4.6 04/23/2022 0358   VLDL 13 04/23/2022 0358   LDLCALC 74 04/23/2022 0358    Physical Exam:    VS:  BP 130/80   Pulse (!) 40   Ht 5\' 8"  (1.727 m)   Wt 123 lb 6.4 oz (56 kg)   SpO2 93%   BMI 18.76 kg/m     Wt Readings from Last 3 Encounters:  07/15/22 123 lb 6.4 oz (56 kg)  07/05/22 128 lb 9.6 oz (58.3 kg)  05/24/22 140 lb 12.8 oz (63.9 kg)     GEN:  Well nourished, well developed in no acute distress HEENT: Normal NECK: No JVD; No carotid bruits LYMPHATICS: No lymphadenopathy CARDIAC: RRR, no murmurs, rubs, gallops RESPIRATORY:  Clear to auscultation without rales, wheezing or rhonchi  ABDOMEN: Soft, non-tender, non-distended MUSCULOSKELETAL: 1+ bilateral lower extremity edema; No deformity  SKIN: Warm and dry NEUROLOGIC:  Alert and oriented x 2 PSYCHIATRIC:  Normal affect   ASSESSMENT:    1. Complete heart block (HCC)   2. Medication management   3. Chronic diastolic heart failure (HCC)   4. Pulmonary hypertension, unspecified (HCC)   5. Essential hypertension     PLAN:    Complete heart block: Rhythm strip in clinic today shows periods of complete heart block with junctional escape rhythm in 40s.  He appears asymptomatic, though daughter reports he has seemed more fatigued recently and sleeping more than usual.  He recently wore Zio patch x2 weeks, will follow-up results.  Refer to EP for PPM evaluation  Chronic diastolic heart failure: Echocardiogram 04/21/22 showed EF 50 to 55%, grade 1 diastolic  dysfunction, normal RV function, severely elevated pulmonary pressures (RVSP 63 mmHg), mild to moderate TR, mild MR, mild AI.  -He was started on Lasix 20 mg daily at prior clinic visit, but daughter was concerned about giving him 20 mg pills and has been cutting pills into fourths and giving him 5 mg.  He remains volume overloaded on exam.  Recommend at least increasing to 10 mg daily.  Check BMET/magnesium in 1 week.  Pulmonary hypertension: Severe on echocardiogram as above.  Not a candidate for invasive evaluation given age and comorbidities, including dementia.  Follows with Dr. 06/21/22 in pulmonology, checking overnight oximetry.  Hypertension: On ramipril 2.5 mg daily.  Appears controlled  T2DM: On insulin  RTC in 3 months   Medication Adjustments/Labs and Tests Ordered: Current medicines are reviewed at length with the patient today.  Concerns regarding medicines are outlined above.  Orders Placed This Encounter  Procedures   Comprehensive Metabolic Panel (CMET)   Magnesium   Ambulatory referral to Cardiac Electrophysiology   EKG 12-Lead   No orders of the defined types were placed in this encounter.   Patient Instructions  Medication Instructions:  Please increase Lasix dose to 10 mg daily. *If you need a refill on your cardiac medications before your next appointment, please call your pharmacy*   Lab Work: IN 1 WEEK: CMET, Mag  If you have labs (blood work) drawn today and your tests are completely normal, you will receive your results only by: MyChart Message (if you have MyChart) OR A paper copy in the mail If you have any lab test that is abnormal or we need to change your treatment, we will call you to review the results.   Testing/Procedures: None   Follow-Up: At [facility], you and your health needs are our priority.  As part of our continuing mission to provide you with exceptional heart care, we have created designated Provider Care Teams.  These Care  Teams include your primary Cardiologist (physician) and Advanced Practice Providers (APPs -  Physician Assistants and Nurse Practitioners) who all work together to provide you with the care you need, when you need it.  We recommend signing up for the patient portal called "MyChart".  Sign up information is provided on this After Visit Summary.  MyChart is used to connect with patients for Virtual Visits (Telemedicine).  Patients are able to view lab/test results, encounter notes, upcoming appointments, etc.  Non-urgent messages can be sent to your provider as well.   To learn more about what you can do with MyChart, go to [email or web address].    Your next appointment:   3 month(s)  The format for your next appointment:   In Person  Provider:   Little Ishikawa, MD     Other Instructions   Important Information About Sugar         Signed, Little Ishikawa, MD  07/15/2022 11:05 PM    [facility]

## 2022-07-15 ENCOUNTER — Ambulatory Visit (INDEPENDENT_AMBULATORY_CARE_PROVIDER_SITE_OTHER): Payer: Medicare PPO | Admitting: Cardiology

## 2022-07-15 ENCOUNTER — Encounter: Payer: Self-pay | Admitting: Cardiology

## 2022-07-15 VITALS — BP 130/80 | HR 40 | Ht 68.0 in | Wt 123.4 lb

## 2022-07-15 DIAGNOSIS — I10 Essential (primary) hypertension: Secondary | ICD-10-CM | POA: Diagnosis not present

## 2022-07-15 DIAGNOSIS — Z79899 Other long term (current) drug therapy: Secondary | ICD-10-CM

## 2022-07-15 DIAGNOSIS — I272 Pulmonary hypertension, unspecified: Secondary | ICD-10-CM | POA: Diagnosis not present

## 2022-07-15 DIAGNOSIS — I442 Atrioventricular block, complete: Secondary | ICD-10-CM

## 2022-07-15 DIAGNOSIS — I5032 Chronic diastolic (congestive) heart failure: Secondary | ICD-10-CM

## 2022-07-15 NOTE — Patient Instructions (Addendum)
Medication Instructions:  Please increase Lasix dose to 10 mg daily. *If you need a refill on your cardiac medications before your next appointment, please call your pharmacy*   Lab Work: IN 1 WEEK: CMET, Mag  If you have labs (blood work) drawn today and your tests are completely normal, you will receive your results only by: MyChart Message (if you have MyChart) OR A paper copy in the mail If you have any lab test that is abnormal or we need to change your treatment, we will call you to review the results.   Testing/Procedures: None   Follow-Up: At [facility], you and your health needs are our priority.  As part of our continuing mission to provide you with exceptional heart care, we have created designated Provider Care Teams.  These Care Teams include your primary Cardiologist (physician) and Advanced Practice Providers (APPs -  Physician Assistants and Nurse Practitioners) who all work together to provide you with the care you need, when you need it.  We recommend signing up for the patient portal called "MyChart".  Sign up information is provided on this After Visit Summary.  MyChart is used to connect with patients for Virtual Visits (Telemedicine).  Patients are able to view lab/test results, encounter notes, upcoming appointments, etc.  Non-urgent messages can be sent to your provider as well.   To learn more about what you can do with MyChart, go to [email or web address].    Your next appointment:   3 month(s)  The format for your next appointment:   In Person  Provider:   Little Ishikawa, MD     Other Instructions   Important Information About Sugar

## 2022-07-16 ENCOUNTER — Telehealth: Payer: Self-pay | Admitting: Cardiology

## 2022-07-16 DIAGNOSIS — R001 Bradycardia, unspecified: Secondary | ICD-10-CM | POA: Diagnosis not present

## 2022-07-16 NOTE — Telephone Encounter (Signed)
Received a call from ir[employer] reporting completed HR block on 7/15, HR 28. Per chart review, Dr. Bjorn Pippin saw pt yesterday 07/15/22 and went over monitor results.

## 2022-07-16 NOTE — Telephone Encounter (Signed)
David Cervantes from [place] is calling to report an abnormal heart monitor

## 2022-07-20 ENCOUNTER — Encounter: Payer: Self-pay | Admitting: Internal Medicine

## 2022-07-23 DIAGNOSIS — C220 Liver cell carcinoma: Secondary | ICD-10-CM | POA: Diagnosis not present

## 2022-07-26 ENCOUNTER — Telehealth: Payer: Self-pay | Admitting: Pulmonary Disease

## 2022-07-26 DIAGNOSIS — J9601 Acute respiratory failure with hypoxia: Secondary | ICD-10-CM

## 2022-07-26 NOTE — Telephone Encounter (Signed)
Received ONO results from [facility] supply in [place]. Will place results in JD's review folder for him to review once he returns to clinic.

## 2022-08-06 NOTE — Telephone Encounter (Signed)
[place] Lions daughter would like results of ONO. Lois phone number is [phone].

## 2022-08-06 NOTE — Telephone Encounter (Signed)
Called and spoke with pt's daughter [place] Lions letting her know that we did have the ONO results and would call her with the results once it is reviewed by JD and she verbalized understanding. Routing back to Dr. Francine Graven.

## 2022-08-12 NOTE — Telephone Encounter (Signed)
Please let patient know that ONO test showed he spent 8 min 29 sec with SpO2 less than 88%. The longest consecutive time below 88% SpO2 was 1 min 30 secs.   He does technically qualify to stay on supplemental oxygen at night, but if it is an inconenience or discomfort to him we can cancel the O2 orders.  Thanks, JD

## 2022-08-17 NOTE — Telephone Encounter (Signed)
Called and spoke to daughter and went over ONO results. And she states that David Cervantes no longer wears the oxygen and wants [place] to discontinue it. Nothing further needed

## 2022-09-06 ENCOUNTER — Ambulatory Visit: Payer: Medicare PPO | Admitting: Pulmonary Disease

## 2022-09-06 ENCOUNTER — Encounter: Payer: Self-pay | Admitting: Pulmonary Disease

## 2022-09-06 VITALS — BP 124/72 | HR 42 | Ht 68.0 in | Wt 125.0 lb

## 2022-09-06 DIAGNOSIS — R600 Localized edema: Secondary | ICD-10-CM | POA: Diagnosis not present

## 2022-09-06 DIAGNOSIS — G4734 Idiopathic sleep related nonobstructive alveolar hypoventilation: Secondary | ICD-10-CM

## 2022-09-06 DIAGNOSIS — Z2911 Encounter for prophylactic immunotherapy for respiratory syncytial virus (RSV): Secondary | ICD-10-CM

## 2022-09-06 DIAGNOSIS — I272 Pulmonary hypertension, unspecified: Secondary | ICD-10-CM

## 2022-09-06 MED ORDER — AREXVY 120 MCG/0.5ML IM SUSR: 0.5000 mL | Freq: Once | INTRAMUSCULAR | 0 refills | Status: AC

## 2022-09-06 MED ORDER — AREXVY 120 MCG/0.5ML IM SUSR: 0.5000 mL | Freq: Once | INTRAMUSCULAR | 0 refills | Status: DC

## 2022-09-06 NOTE — Patient Instructions (Signed)
Recommend obtaining influenza, RSV and covid 19 vaccines  Please follow up as needed

## 2022-09-06 NOTE — Progress Notes (Unsigned)
Synopsis: Referred in June 2023 for pulmonary hypertension by [name], MD  Subjective:   PATIENT ID: David Cervantes GENDER: male DOB: 05/01/1927, MRN: [id number]  HPI  Chief Complaint  Patient presents with   Follow-up    2 mo f/u. States he has been doing well.    David Cervantes is a 88 year old male, never smoker with DMII who returns to pulmonary clinic for follow up of viral pneumonia and acute respiratory failure.   He is feeling at baseline. He has stopped using his oxygen at night. He reports increased swelling of his lower extremities. His daughter has accompanied him today.  OV 05/20/22 He was admitted 5/10 to 5/14 for acute hypoxemic respiratory failure due to acute heart failure and viral pneumonia due to parainfluenza virus 3. He had ECHO done which showed EF 99991111, grade I diastolic dysfunction, RV size moderately enlarged and RV function is normal. RVSP is 62.23mmHg. He was discharged on home O2 and is currently using 2L.   He is feeling much better since discharge. He has been working with home PT and completed a 6MWT on room air and no reported desaturations. He denies any cough or fevers. His appetite remains strong. He was prescribed a diuretic by his PCP but has not started it yet. He is sleeping ok and has no nighttime awakenings. He denies orthopnea. He does have lower extremity edema. He reports a history of loud snoring but this has gone away over recent years.   He is accompanied by his daughter. He is a never smoker and no second hand smoke exposure. He is a retired Forensic psychologist.   Past Medical History:  Diagnosis Date   Diabetes mellitus without complication ([place])      No family history on file.   Social History   Socioeconomic History   Marital status: Unknown    Spouse name: Not on file   Number of children: Not on file   Years of education: Not on file   Highest education level: Not on file  Occupational History   Not on file  Tobacco Use    Smoking status: Unknown   Smokeless tobacco: Not on file  Vaping Use   Vaping Use: Never used  Substance and Sexual Activity   Alcohol use: Not Currently   Drug use: Not Currently   Sexual activity: Not Currently  Other Topics Concern   Not on file  Social History Narrative   Not on file   Social Determinants of Health   Financial Resource Strain: Not on file  Food Insecurity: Not on file  Transportation Needs: Not on file  Physical Activity: Not on file  Stress: Not on file  Social Connections: Not on file  Intimate Partner Violence: Not on file     No Known Allergies   Outpatient Medications Prior to Visit  Medication Sig Dispense Refill   cholecalciferol (VITAMIN D3) 25 MCG (1000 UNIT) tablet Take 1,000 Units by mouth daily.     Cyanocobalamin (VITAMIN B-12 PO) Take 1 tablet by mouth daily.     furosemide (LASIX) 20 MG tablet Take 10 mg by mouth daily.     LANTUS SOLOSTAR 100 UNIT/ML Solostar Pen Inject 8-9 Units into the skin every evening.     magnesium oxide (MAG-OX) 400 MG tablet Take 1 tablet (400 mg total) by mouth daily. (Patient taking differently: Take 200 mg by mouth daily.) 30 tablet 3   metFORMIN (GLUCOPHAGE-XR) 500 MG 24 hr tablet Take 500 mg  by mouth daily with breakfast.     potassium chloride (KLOR-CON M) 10 MEQ tablet Take 5 mEq by mouth daily.     ramipril (ALTACE) 2.5 MG capsule Take 2.5 mg by mouth daily.     SUPER B COMPLEX/C PO Take 1 tablet by mouth daily.     vitamin C (ASCORBIC ACID) 500 MG tablet Take 500 mg by mouth daily.     Zinc 30 MG CAPS Take 1 capsule by mouth daily.     No facility-administered medications prior to visit.    Review of Systems  Constitutional:  Negative for chills, fever, malaise/fatigue and weight loss.  HENT:  Negative for congestion, sinus pain and sore throat.   Eyes: Negative.   Respiratory:  Negative for cough, hemoptysis, sputum production, shortness of breath and wheezing.   Cardiovascular:  Positive for leg  swelling. Negative for chest pain, palpitations, orthopnea and claudication.  Gastrointestinal:  Negative for abdominal pain, heartburn, nausea and vomiting.  Genitourinary: Negative.   Musculoskeletal:  Negative for joint pain and myalgias.  Skin:  Negative for itching and rash.  Neurological:  Negative for weakness.  Endo/Heme/Allergies: Negative.   Psychiatric/Behavioral: Negative.     Objective:   Vitals:   09/06/22 1620  BP: 124/72  Pulse: (!) 42  SpO2: 98%  Weight: 125 lb (56.7 kg)  Height: 5\' 8"  (1.727 m)    Physical Exam Constitutional:      General: He is not in acute distress. HENT:     Head: Normocephalic and atraumatic.  Cardiovascular:     Rate and Rhythm: Normal rate and regular rhythm.     Pulses: Normal pulses.     Heart sounds: Normal heart sounds. No murmur heard. Pulmonary:     Effort: Pulmonary effort is normal.     Breath sounds: Decreased air movement present. No wheezing, rhonchi or rales.  Musculoskeletal:     Right lower leg: Edema present.     Left lower leg: Edema present.  Skin:    General: Skin is warm and dry.  Neurological:     General: No focal deficit present.     Mental Status: He is alert.  Psychiatric:        Mood and Affect: Mood normal.        Behavior: Behavior normal.        Thought Content: Thought content normal.        Judgment: Judgment normal.    CBC    Component Value Date/Time   WBC 11.2 (H) 04/25/2022 0404   RBC 3.76 (L) 04/25/2022 0404   HGB 11.5 (L) 04/25/2022 0404   HCT 35.0 (L) 04/25/2022 0404   PLT 286 04/25/2022 0404   MCV 93.1 04/25/2022 0404   MCH 30.6 04/25/2022 0404   MCHC 32.9 04/25/2022 0404   RDW 13.7 04/25/2022 0404   LYMPHSABS 1.0 04/25/2022 0404   MONOABS 0.8 04/25/2022 0404   EOSABS 0.0 04/25/2022 0404   BASOSABS 0.0 04/25/2022 0404      Latest Ref Rng & Units 05/24/2022    4:47 PM 04/25/2022    4:04 AM 04/24/2022    4:44 AM  BMP  Glucose 70 - 99 mg/dL 220  75  220   BUN 10 - 36 mg/dL  12  32  27   Creatinine 0.76 - 1.27 mg/dL 1.05  1.15  1.25   BUN/Creat Ratio 10 - 24 11     Sodium 134 - 144 mmol/L 139  143  140   Potassium  3.5 - 5.2 mmol/L 4.9  3.4  2.7   Chloride 96 - 106 mmol/L 99  110  104   CO2 20 - 29 mmol/L 25  28  26    Calcium 8.6 - 10.2 mg/dL 8.1  8.2  8.1    Chest imaging: CXR 04/20/22 Mild cardiogenic failure with mild interstitial pulmonary edema and small bilateral pleural effusions. This appears progressive since prior examination.  PFT:     No data to display           Labs:  Path:  Echo 04/21/22: EF 99991111, grade I diastolic dysfunction, RV size moderately enlarged and RV function is normal. RVSP is 62.35mmHg  Heart Catheterization:  Assessment & Plan:   No diagnosis found.  Discussion: David Cervantes is a 88 year old male, never smoker with DMII who returns to pulmonary clinic for follow up of viral pneumonia and acute respiratory failure.   Echo showed enlarged RV and elevated RVSP at 62.58mmHg but in the setting of acute hypoxemic respiratory failure due to viral pneumonia. His main risk factor for developing pulmonary hypertension appears to be sleep disordered breathing based on his history of snoring. We will check overnight oximetry testing to determine if he still needs oxygen therapy. We will check lower extremity US to rule out DVT due to significant swelling on exam today.  Follow up in 2 months   Freda Jackson, MD [facility] Office: [phone]    Current Outpatient Medications:    cholecalciferol (VITAMIN D3) 25 MCG (1000 UNIT) tablet, Take 1,000 Units by mouth daily., Disp: , Rfl:    Cyanocobalamin (VITAMIN B-12 PO), Take 1 tablet by mouth daily., Disp: , Rfl:    furosemide (LASIX) 20 MG tablet, Take 10 mg by mouth daily., Disp: , Rfl:    LANTUS SOLOSTAR 100 UNIT/ML Solostar Pen, Inject 8-9 Units into the skin every evening., Disp: , Rfl:    magnesium oxide (MAG-OX) 400 MG tablet, Take 1 tablet  (400 mg total) by mouth daily. (Patient taking differently: Take 200 mg by mouth daily.), Disp: 30 tablet, Rfl: 3   metFORMIN (GLUCOPHAGE-XR) 500 MG 24 hr tablet, Take 500 mg by mouth daily with breakfast., Disp: , Rfl:    potassium chloride (KLOR-CON M) 10 MEQ tablet, Take 5 mEq by mouth daily., Disp: , Rfl:    ramipril (ALTACE) 2.5 MG capsule, Take 2.5 mg by mouth daily., Disp: , Rfl:    SUPER B COMPLEX/C PO, Take 1 tablet by mouth daily., Disp: , Rfl:    vitamin C (ASCORBIC ACID) 500 MG tablet, Take 500 mg by mouth daily., Disp: , Rfl:    Zinc 30 MG CAPS, Take 1 capsule by mouth daily., Disp: , Rfl:

## 2022-09-07 ENCOUNTER — Encounter: Payer: Self-pay | Admitting: Pulmonary Disease

## 2022-09-09 DIAGNOSIS — E782 Mixed hyperlipidemia: Secondary | ICD-10-CM | POA: Diagnosis not present

## 2022-09-09 DIAGNOSIS — Z0000 Encounter for general adult medical examination without abnormal findings: Secondary | ICD-10-CM | POA: Diagnosis not present

## 2022-09-09 DIAGNOSIS — I272 Pulmonary hypertension, unspecified: Secondary | ICD-10-CM | POA: Diagnosis not present

## 2022-09-09 DIAGNOSIS — E1142 Type 2 diabetes mellitus with diabetic polyneuropathy: Secondary | ICD-10-CM | POA: Diagnosis not present

## 2022-09-09 DIAGNOSIS — E1165 Type 2 diabetes mellitus with hyperglycemia: Secondary | ICD-10-CM | POA: Diagnosis not present

## 2022-09-09 DIAGNOSIS — F0390 Unspecified dementia without behavioral disturbance: Secondary | ICD-10-CM | POA: Diagnosis not present

## 2022-09-09 DIAGNOSIS — E113293 Type 2 diabetes mellitus with mild nonproliferative diabetic retinopathy without macular edema, bilateral: Secondary | ICD-10-CM | POA: Diagnosis not present

## 2022-09-09 DIAGNOSIS — I10 Essential (primary) hypertension: Secondary | ICD-10-CM | POA: Diagnosis not present

## 2022-09-09 DIAGNOSIS — I5030 Unspecified diastolic (congestive) heart failure: Secondary | ICD-10-CM | POA: Diagnosis not present

## 2022-09-09 DIAGNOSIS — E46 Unspecified protein-calorie malnutrition: Secondary | ICD-10-CM | POA: Diagnosis not present

## 2022-09-12 ENCOUNTER — Emergency Department (HOSPITAL_COMMUNITY): Payer: Medicare PPO

## 2022-09-12 ENCOUNTER — Other Ambulatory Visit: Payer: Self-pay

## 2022-09-12 ENCOUNTER — Inpatient Hospital Stay (HOSPITAL_COMMUNITY): Payer: Medicare PPO | Admitting: Anesthesiology

## 2022-09-12 ENCOUNTER — Inpatient Hospital Stay (HOSPITAL_COMMUNITY)
Admission: EM | Admit: 2022-09-12 | Discharge: 2022-09-20 | DRG: 480 | Disposition: A | Discharge status: Skilled Nursing Facility | Payer: Medicare PPO | Attending: Internal Medicine | Admitting: Internal Medicine

## 2022-09-12 ENCOUNTER — Encounter (HOSPITAL_COMMUNITY)
Admission: EM | Disposition: A | Discharge status: Skilled Nursing Facility | Payer: Self-pay | Source: Home / Self Care | Attending: Internal Medicine

## 2022-09-12 DIAGNOSIS — R6889 Other general symptoms and signs: Secondary | ICD-10-CM | POA: Diagnosis not present

## 2022-09-12 DIAGNOSIS — E43 Unspecified severe protein-calorie malnutrition: Secondary | ICD-10-CM | POA: Diagnosis not present

## 2022-09-12 DIAGNOSIS — E785 Hyperlipidemia, unspecified: Secondary | ICD-10-CM | POA: Diagnosis present

## 2022-09-12 DIAGNOSIS — Z79899 Other long term (current) drug therapy: Secondary | ICD-10-CM

## 2022-09-12 DIAGNOSIS — R739 Hyperglycemia, unspecified: Secondary | ICD-10-CM | POA: Diagnosis not present

## 2022-09-12 DIAGNOSIS — W19XXXA Unspecified fall, initial encounter: Secondary | ICD-10-CM | POA: Diagnosis not present

## 2022-09-12 DIAGNOSIS — Z888 Allergy status to other drugs, medicaments and biological substances status: Secondary | ICD-10-CM

## 2022-09-12 DIAGNOSIS — Z01810 Encounter for preprocedural cardiovascular examination: Secondary | ICD-10-CM

## 2022-09-12 DIAGNOSIS — S50812A Abrasion of left forearm, initial encounter: Secondary | ICD-10-CM | POA: Diagnosis present

## 2022-09-12 DIAGNOSIS — Z681 Body mass index (BMI) 19 or less, adult: Secondary | ICD-10-CM

## 2022-09-12 DIAGNOSIS — I2721 Secondary pulmonary arterial hypertension: Secondary | ICD-10-CM | POA: Diagnosis present

## 2022-09-12 DIAGNOSIS — Z7984 Long term (current) use of oral hypoglycemic drugs: Secondary | ICD-10-CM | POA: Diagnosis not present

## 2022-09-12 DIAGNOSIS — E1165 Type 2 diabetes mellitus with hyperglycemia: Secondary | ICD-10-CM | POA: Diagnosis not present

## 2022-09-12 DIAGNOSIS — S72141A Displaced intertrochanteric fracture of right femur, initial encounter for closed fracture: Principal | ICD-10-CM | POA: Diagnosis present

## 2022-09-12 DIAGNOSIS — I442 Atrioventricular block, complete: Secondary | ICD-10-CM | POA: Diagnosis present

## 2022-09-12 DIAGNOSIS — S50811A Abrasion of right forearm, initial encounter: Secondary | ICD-10-CM | POA: Diagnosis present

## 2022-09-12 DIAGNOSIS — M6281 Muscle weakness (generalized): Secondary | ICD-10-CM | POA: Diagnosis not present

## 2022-09-12 DIAGNOSIS — I498 Other specified cardiac arrhythmias: Secondary | ICD-10-CM

## 2022-09-12 DIAGNOSIS — Y92013 Bedroom of single-family (private) house as the place of occurrence of the external cause: Secondary | ICD-10-CM

## 2022-09-12 DIAGNOSIS — S79911A Unspecified injury of right hip, initial encounter: Secondary | ICD-10-CM | POA: Diagnosis present

## 2022-09-12 DIAGNOSIS — E876 Hypokalemia: Secondary | ICD-10-CM | POA: Diagnosis present

## 2022-09-12 DIAGNOSIS — F05 Delirium due to known physiological condition: Secondary | ICD-10-CM | POA: Diagnosis not present

## 2022-09-12 DIAGNOSIS — E119 Type 2 diabetes mellitus without complications: Secondary | ICD-10-CM | POA: Diagnosis not present

## 2022-09-12 DIAGNOSIS — Z7401 Bed confinement status: Secondary | ICD-10-CM | POA: Diagnosis not present

## 2022-09-12 DIAGNOSIS — D649 Anemia, unspecified: Secondary | ICD-10-CM | POA: Diagnosis not present

## 2022-09-12 DIAGNOSIS — E0800 Diabetes mellitus due to underlying condition with hyperosmolarity without nonketotic hyperglycemic-hyperosmolar coma (NKHHC): Secondary | ICD-10-CM | POA: Diagnosis not present

## 2022-09-12 DIAGNOSIS — Z66 Do not resuscitate: Secondary | ICD-10-CM | POA: Diagnosis not present

## 2022-09-12 DIAGNOSIS — Z7189 Other specified counseling: Secondary | ICD-10-CM | POA: Diagnosis not present

## 2022-09-12 DIAGNOSIS — S72001A Fracture of unspecified part of neck of right femur, initial encounter for closed fracture: Secondary | ICD-10-CM | POA: Diagnosis not present

## 2022-09-12 DIAGNOSIS — I509 Heart failure, unspecified: Secondary | ICD-10-CM | POA: Diagnosis not present

## 2022-09-12 DIAGNOSIS — R2681 Unsteadiness on feet: Secondary | ICD-10-CM | POA: Diagnosis not present

## 2022-09-12 DIAGNOSIS — F0390 Unspecified dementia without behavioral disturbance: Secondary | ICD-10-CM | POA: Diagnosis present

## 2022-09-12 DIAGNOSIS — R262 Difficulty in walking, not elsewhere classified: Secondary | ICD-10-CM | POA: Diagnosis not present

## 2022-09-12 DIAGNOSIS — Z4789 Encounter for other orthopedic aftercare: Secondary | ICD-10-CM | POA: Diagnosis not present

## 2022-09-12 DIAGNOSIS — I480 Paroxysmal atrial fibrillation: Secondary | ICD-10-CM | POA: Diagnosis not present

## 2022-09-12 DIAGNOSIS — Z794 Long term (current) use of insulin: Secondary | ICD-10-CM

## 2022-09-12 DIAGNOSIS — R1312 Dysphagia, oropharyngeal phase: Secondary | ICD-10-CM | POA: Diagnosis not present

## 2022-09-12 DIAGNOSIS — S60512A Abrasion of left hand, initial encounter: Secondary | ICD-10-CM | POA: Diagnosis present

## 2022-09-12 DIAGNOSIS — S60511A Abrasion of right hand, initial encounter: Secondary | ICD-10-CM | POA: Diagnosis present

## 2022-09-12 DIAGNOSIS — J9811 Atelectasis: Secondary | ICD-10-CM | POA: Diagnosis present

## 2022-09-12 DIAGNOSIS — Z743 Need for continuous supervision: Secondary | ICD-10-CM | POA: Diagnosis not present

## 2022-09-12 DIAGNOSIS — I5032 Chronic diastolic (congestive) heart failure: Secondary | ICD-10-CM | POA: Diagnosis not present

## 2022-09-12 DIAGNOSIS — F0280 Dementia in other diseases classified elsewhere without behavioral disturbance: Secondary | ICD-10-CM | POA: Diagnosis not present

## 2022-09-12 DIAGNOSIS — G309 Alzheimer's disease, unspecified: Secondary | ICD-10-CM | POA: Diagnosis not present

## 2022-09-12 DIAGNOSIS — M7989 Other specified soft tissue disorders: Secondary | ICD-10-CM | POA: Diagnosis not present

## 2022-09-12 DIAGNOSIS — I110 Hypertensive heart disease with heart failure: Secondary | ICD-10-CM

## 2022-09-12 DIAGNOSIS — Z043 Encounter for examination and observation following other accident: Secondary | ICD-10-CM | POA: Diagnosis not present

## 2022-09-12 DIAGNOSIS — E11649 Type 2 diabetes mellitus with hypoglycemia without coma: Secondary | ICD-10-CM | POA: Diagnosis not present

## 2022-09-12 DIAGNOSIS — N179 Acute kidney failure, unspecified: Secondary | ICD-10-CM

## 2022-09-12 DIAGNOSIS — M25551 Pain in right hip: Secondary | ICD-10-CM | POA: Diagnosis not present

## 2022-09-12 DIAGNOSIS — I10 Essential (primary) hypertension: Secondary | ICD-10-CM | POA: Diagnosis present

## 2022-09-12 DIAGNOSIS — W010XXA Fall on same level from slipping, tripping and stumbling without subsequent striking against object, initial encounter: Secondary | ICD-10-CM | POA: Diagnosis present

## 2022-09-12 DIAGNOSIS — S72141S Displaced intertrochanteric fracture of right femur, sequela: Secondary | ICD-10-CM | POA: Diagnosis not present

## 2022-09-12 DIAGNOSIS — M25572 Pain in left ankle and joints of left foot: Secondary | ICD-10-CM | POA: Diagnosis not present

## 2022-09-12 HISTORY — PX: INTRAMEDULLARY (IM) NAIL INTERTROCHANTERIC: SHX5875

## 2022-09-12 LAB — BASIC METABOLIC PANEL
Anion gap: 3 — ABNORMAL LOW (ref 5–15)
BUN: 21 mg/dL (ref 8–23)
CO2: 27 mmol/L (ref 22–32)
Calcium: 8 mg/dL — ABNORMAL LOW (ref 8.9–10.3)
Chloride: 104 mmol/L (ref 98–111)
Creatinine, Ser: 1.09 mg/dL (ref 0.61–1.24)
GFR, Estimated: >60 mL/min (ref >60)
Glucose, Bld: 219 mg/dL — ABNORMAL HIGH (ref 70–99)
Potassium: 3.4 mmol/L — ABNORMAL LOW (ref 3.5–5.1)
Sodium: 134 mmol/L — ABNORMAL LOW (ref 135–145)

## 2022-09-12 LAB — CBC WITH DIFFERENTIAL/PLATELET
Abs Immature Granulocytes: 0.09 10*3/uL — ABNORMAL HIGH (ref 0.00–0.07)
Basophils Absolute: 0 10*3/uL (ref 0.0–0.1)
Basophils Relative: 0 %
Eosinophils Absolute: 0.1 10*3/uL (ref 0.0–0.5)
Eosinophils Relative: 1 %
HCT: 30.3 % — ABNORMAL LOW (ref 39.0–52.0)
Hemoglobin: 10 g/dL — ABNORMAL LOW (ref 13.0–17.0)
Immature Granulocytes: 1 %
Lymphocytes Relative: 10 %
Lymphs Abs: 0.9 10*3/uL (ref 0.7–4.0)
MCH: 31 pg (ref 26.0–34.0)
MCHC: 33 g/dL (ref 30.0–36.0)
MCV: 93.8 fL (ref 80.0–100.0)
Monocytes Absolute: 0.8 10*3/uL (ref 0.1–1.0)
Monocytes Relative: 9 %
Neutro Abs: 7.7 10*3/uL (ref 1.7–7.7)
Neutrophils Relative %: 79 %
Platelets: 198 10*3/uL (ref 150–400)
RBC: 3.23 MIL/uL — ABNORMAL LOW (ref 4.22–5.81)
RDW: 13.2 % (ref 11.5–15.5)
WBC: 9.7 10*3/uL (ref 4.0–10.5)
nRBC: 0 % (ref 0.0–0.2)

## 2022-09-12 LAB — GLUCOSE, CAPILLARY
Glucose-Capillary: 206 mg/dL — ABNORMAL HIGH (ref 70–99)
Glucose-Capillary: 271 mg/dL — ABNORMAL HIGH (ref 70–99)
Glucose-Capillary: 182 mg/dL — ABNORMAL HIGH (ref 70–99)

## 2022-09-12 LAB — HEMOGLOBIN A1C
Hgb A1c MFr Bld: 9.8 % — ABNORMAL HIGH (ref 4.8–5.6)
Mean Plasma Glucose: 234.56 mg/dL

## 2022-09-12 LAB — TYPE AND SCREEN
ABO/RH(D): A POS
Antibody Screen: NEGATIVE

## 2022-09-12 LAB — ABO/RH: ABO/RH(D): A POS

## 2022-09-12 LAB — MAGNESIUM: Magnesium: 2 mg/dL (ref 1.7–2.4)

## 2022-09-12 LAB — ALBUMIN: Albumin: 2.5 g/dL — ABNORMAL LOW (ref 3.5–5.0)

## 2022-09-12 LAB — PROTIME-INR
INR: 1.2 (ref 0.8–1.2)
Prothrombin Time: 14.7 seconds (ref 11.4–15.2)

## 2022-09-12 SURGERY — FIXATION, FRACTURE, INTERTROCHANTERIC, WITH INTRAMEDULLARY ROD
Anesthesia: Spinal | Laterality: Right

## 2022-09-12 MED ORDER — DEXAMETHASONE SODIUM PHOSPHATE 10 MG/ML IJ SOLN
INTRAMUSCULAR | Status: AC
  Filled 2022-09-12: qty 1

## 2022-09-12 MED ORDER — PHENYLEPHRINE HCL-NACL 20-0.9 MG/250ML-% IV SOLN
INTRAVENOUS | Status: DC | PRN
  Administered 2022-09-12: 50 ug/min via INTRAVENOUS

## 2022-09-12 MED ORDER — POVIDONE-IODINE 10 % EX SWAB: 2.0000 | Freq: Once | CUTANEOUS | Status: DC

## 2022-09-12 MED ORDER — VASOPRESSIN 20 UNIT/ML IV SOLN
INTRAVENOUS | Status: AC
  Filled 2022-09-12: qty 1

## 2022-09-12 MED ORDER — BUPIVACAINE HCL (PF) 0.25 % IJ SOLN
INTRAMUSCULAR | Status: AC
  Filled 2022-09-12: qty 30

## 2022-09-12 MED ORDER — KETAMINE HCL 50 MG/5ML IJ SOSY
PREFILLED_SYRINGE | INTRAMUSCULAR | Status: AC
  Filled 2022-09-12: qty 5

## 2022-09-12 MED ORDER — GLYCOPYRROLATE 0.2 MG/ML IJ SOLN
INTRAMUSCULAR | Status: AC
  Filled 2022-09-12: qty 1

## 2022-09-12 MED ORDER — PROPOFOL 10 MG/ML IV BOLUS
INTRAVENOUS | Status: DC | PRN
  Administered 2022-09-12: 20 mg via INTRAVENOUS

## 2022-09-12 MED ORDER — INSULIN ASPART 100 UNIT/ML IJ SOLN: 0.0000 [IU] | Freq: Every day | INTRAMUSCULAR | Status: DC

## 2022-09-12 MED ORDER — SODIUM CHLORIDE 0.9 % IR SOLN
Status: DC | PRN
  Administered 2022-09-12: 1000 mL

## 2022-09-12 MED ORDER — KETAMINE HCL 10 MG/ML IJ SOLN
INTRAMUSCULAR | Status: DC | PRN
  Administered 2022-09-12 (×2): 10 mg via INTRAVENOUS

## 2022-09-12 MED ORDER — PHENYLEPHRINE 80 MCG/ML (10ML) SYRINGE FOR IV PUSH (FOR BLOOD PRESSURE SUPPORT)
PREFILLED_SYRINGE | INTRAVENOUS | Status: DC | PRN
  Administered 2022-09-12 (×2): 160 ug via INTRAVENOUS

## 2022-09-12 MED ORDER — POTASSIUM CHLORIDE 10 MEQ/100ML IV SOLN
10.0000 meq | INTRAVENOUS | Status: AC
  Administered 2022-09-12 (×3): 10 meq via INTRAVENOUS
  Filled 2022-09-12 (×3): qty 100

## 2022-09-12 MED ORDER — ONDANSETRON HCL 4 MG/2ML IJ SOLN
INTRAMUSCULAR | Status: DC | PRN
  Administered 2022-09-12: 4 mg via INTRAVENOUS

## 2022-09-12 MED ORDER — INSULIN ASPART 100 UNIT/ML IJ SOLN
0.0000 [IU] | Freq: Three times a day (TID) | INTRAMUSCULAR | Status: DC
  Administered 2022-09-12 – 2022-09-13 (×3): 8 [IU] via SUBCUTANEOUS
  Administered 2022-09-13: 3 [IU] via SUBCUTANEOUS
  Administered 2022-09-14: 5 [IU] via SUBCUTANEOUS
  Administered 2022-09-14: 3 [IU] via SUBCUTANEOUS
  Administered 2022-09-15: 5 [IU] via SUBCUTANEOUS

## 2022-09-12 MED ORDER — CHLORHEXIDINE GLUCONATE 4 % EX LIQD: 60.0000 mL | Freq: Once | CUTANEOUS | Status: DC

## 2022-09-12 MED ORDER — VASOPRESSIN 20 UNIT/ML IV SOLN
INTRAVENOUS | Status: DC | PRN
  Administered 2022-09-12: 2 [IU] via INTRAVENOUS

## 2022-09-12 MED ORDER — SODIUM CHLORIDE 0.9 % IV SOLN: 0.5000 ug/min | INTRAVENOUS | Status: DC

## 2022-09-12 MED ORDER — BUPIVACAINE HCL (PF) 0.25 % IJ SOLN
INTRAMUSCULAR | Status: DC | PRN
  Administered 2022-09-12: 30 mL

## 2022-09-12 MED ORDER — BUPIVACAINE IN DEXTROSE 0.75-8.25 % IT SOLN
INTRATHECAL | Status: DC | PRN
  Administered 2022-09-12: 1.6 mL via INTRATHECAL

## 2022-09-12 MED ORDER — ONDANSETRON HCL 4 MG/2ML IJ SOLN
INTRAMUSCULAR | Status: AC
  Filled 2022-09-12: qty 2

## 2022-09-12 MED ORDER — LACTATED RINGERS IV SOLN: INTRAVENOUS | Status: DC | PRN

## 2022-09-12 MED ORDER — HYDROCODONE-ACETAMINOPHEN 5-325 MG PO TABS: 1.0000 | ORAL_TABLET | Freq: Four times a day (QID) | ORAL | Status: DC | PRN

## 2022-09-12 MED ORDER — EPINEPHRINE HCL 5 MG/250ML IV SOLN IN NS
0.5000 ug/min | INTRAVENOUS | Status: DC
  Filled 2022-09-12: qty 250

## 2022-09-12 MED ORDER — PROPOFOL 10 MG/ML IV BOLUS
INTRAVENOUS | Status: AC
  Filled 2022-09-12: qty 20

## 2022-09-12 MED ORDER — PROPOFOL 1000 MG/100ML IV EMUL
INTRAVENOUS | Status: AC
  Filled 2022-09-12: qty 100

## 2022-09-12 MED ORDER — TRANEXAMIC ACID-NACL 1000-0.7 MG/100ML-% IV SOLN
1000.0000 mg | INTRAVENOUS | Status: AC
  Administered 2022-09-12: 1000 mg via INTRAVENOUS
  Filled 2022-09-12: qty 100

## 2022-09-12 MED ORDER — PROPOFOL 500 MG/50ML IV EMUL
INTRAVENOUS | Status: DC | PRN
  Administered 2022-09-12: 5 ug/kg/min via INTRAVENOUS

## 2022-09-12 MED ORDER — MORPHINE SULFATE (PF) 2 MG/ML IV SOLN: 0.5000 mg | INTRAVENOUS | Status: DC | PRN

## 2022-09-12 MED ORDER — CEFAZOLIN SODIUM-DEXTROSE 2-4 GM/100ML-% IV SOLN
2.0000 g | INTRAVENOUS | Status: AC
  Administered 2022-09-12: 2 g via INTRAVENOUS
  Filled 2022-09-12: qty 100

## 2022-09-12 SURGICAL SUPPLY — 44 items
BAG COUNTER SPONGE SURGICOUNT (BAG) IMPLANT
BIT DRILL INTERTAN LAG SCREW (BIT) IMPLANT
BLADE SURG 15 STRL LF DISP TIS (BLADE) ×1 IMPLANT
BLADE SURG 15 STRL SS (BLADE) ×1
BNDG GAUZE DERMACEA FLUFF 4 (GAUZE/BANDAGES/DRESSINGS) ×1 IMPLANT
COVER PERINEAL POST (MISCELLANEOUS) ×1 IMPLANT
COVER SURGICAL LIGHT HANDLE (MISCELLANEOUS) ×1 IMPLANT
DRAPE INCISE IOBAN 66X45 STRL (DRAPES) ×1 IMPLANT
DRAPE STERI IOBAN 125X83 (DRAPES) ×1 IMPLANT
DRESSING AQUACEL AG SP 3.5X4 (GAUZE/BANDAGES/DRESSINGS) ×2 IMPLANT
DRESSING AQUACEL AG SP 3.5X6 (GAUZE/BANDAGES/DRESSINGS) ×1 IMPLANT
DRSG AQUACEL AG ADV 3.5X 4 (GAUZE/BANDAGES/DRESSINGS) IMPLANT
DRSG AQUACEL AG SP 3.5X4 (GAUZE/BANDAGES/DRESSINGS) ×2
DRSG AQUACEL AG SP 3.5X6 (GAUZE/BANDAGES/DRESSINGS) ×1
DURAPREP 26ML APPLICATOR (WOUND CARE) ×1 IMPLANT
ELECT REM PT RETURN 15FT ADLT (MISCELLANEOUS) ×1 IMPLANT
GAUZE SPONGE 4X4 12PLY STRL (GAUZE/BANDAGES/DRESSINGS) ×1 IMPLANT
GAUZE XEROFORM 1X8 LF (GAUZE/BANDAGES/DRESSINGS) ×1 IMPLANT
GLOVE BIOGEL PI IND STRL 8 (GLOVE) ×1 IMPLANT
GLOVE ECLIPSE 7.0 STRL STRAW (GLOVE) IMPLANT
GLOVE SURG ORTHO 8.0 STRL STRW (GLOVE) ×1 IMPLANT
GOWN STRL REUS W/ TWL LRG LVL3 (GOWN DISPOSABLE) ×1 IMPLANT
GOWN STRL REUS W/ TWL XL LVL3 (GOWN DISPOSABLE) IMPLANT
GOWN STRL REUS W/TWL LRG LVL3 (GOWN DISPOSABLE) ×2
GOWN STRL REUS W/TWL XL LVL3 (GOWN DISPOSABLE) ×1
GUIDE PIN 3.2X343 (PIN) ×2
GUIDE PIN 3.2X343MM (PIN) ×2
KIT BASIN OR (CUSTOM PROCEDURE TRAY) ×1 IMPLANT
KIT TURNOVER KIT A (KITS) IMPLANT
MANIFOLD NEPTUNE II (INSTRUMENTS) ×1 IMPLANT
MARKER SKIN DUAL TIP RULER LAB (MISCELLANEOUS) ×1 IMPLANT
NAIL LOCK CANN 10X420 125D RT (Miscellaneous) IMPLANT
NS IRRIG 1000ML POUR BTL (IV SOLUTION) ×1 IMPLANT
PACK GENERAL/GYN (CUSTOM PROCEDURE TRAY) ×1 IMPLANT
PIN GUIDE 3.2X343MM (PIN) IMPLANT
SCREW LAG COMPR KIT 95/90 (Screw) IMPLANT
SOL PREP POV-IOD 4OZ 10% (MISCELLANEOUS) ×1 IMPLANT
SPONGE T-LAP 4X18 ~~LOC~~+RFID (SPONGE) ×1 IMPLANT
STAPLER VISISTAT 35W (STAPLE) ×1 IMPLANT
SUT NYLON 3 0 (SUTURE) IMPLANT
SUT VIC AB 2-0 CT1 27 (SUTURE) ×2
SUT VIC AB 2-0 CT1 TAPERPNT 27 (SUTURE) ×2 IMPLANT
SUT VICRYL 0 UR6 27IN ABS (SUTURE) ×2 IMPLANT
TOWEL OR 17X26 10 PK STRL BLUE (TOWEL DISPOSABLE) ×1 IMPLANT

## 2022-09-12 NOTE — Anesthesia Preprocedure Evaluation (Addendum)
Anesthesia Evaluation  Patient identified by MRN, date of birth, ID band Patient awake    Reviewed: Allergy & Precautions, NPO status , Patient's Chart, lab work & pertinent test results  Airway Mallampati: II  TM Distance: >3 FB Neck ROM: Full    Dental  (+) Dental Advisory Given, Poor Dentition   Pulmonary neg pulmonary ROS,    Pulmonary exam normal breath sounds clear to auscultation       Cardiovascular hypertension, Pt. on medications pulmonary hypertension (severe)+CHF  Normal cardiovascular exam+ dysrhythmias  Rhythm:Regular Rate:Normal     Neuro/Psych negative neurological ROS     GI/Hepatic negative GI ROS, Neg liver ROS,   Endo/Other  diabetes, Type 2, Oral Hypoglycemic Agents  Renal/GU negative Renal ROS     Musculoskeletal RIGHT HIP INTERTROCHANTERIC HIP FRACTURE   Abdominal   Peds  Hematology  (+) Blood dyscrasia (Plt 198k), anemia ,   Anesthesia Other Findings   Reproductive/Obstetrics                            Anesthesia Physical Anesthesia Plan  ASA: 4 and emergent  Anesthesia Plan: Spinal   Post-op Pain Management: Ofirmev IV (intra-op)*   Induction: Intravenous  PONV Risk Score and Plan: 1 and TIVA and Treatment may vary due to age or medical condition  Airway Management Planned: Natural Airway and Nasal Cannula  Additional Equipment: Arterial line  Intra-op Plan:   Post-operative Plan:   Informed Consent: I have reviewed the patients History and Physical, chart, labs and discussed the procedure including the risks, benefits and alternatives for the proposed anesthesia with the patient or authorized representative who has indicated his/her understanding and acceptance.     Dental advisory given  Plan Discussed with: CRNA  Anesthesia Plan Comments: (Per Cardiology: Patient is high risk 2/2 history of severe pulmonary hypertension, concern for periods of  complete heart block. Will apply ZOLL pads for transcutaneous pacing if needed. Arterial line for BP monitoring and ABGs if needed.  Possible CVL for vasoactive gtts.)       Anesthesia Quick Evaluation

## 2022-09-12 NOTE — ED Provider Notes (Signed)
[facility] DEPT Provider Note   CSN: [id number] Arrival date & time: 09/12/22  0831     History  Chief Complaint  Patient presents with   David Cervantes is a 88 y.o. male.  The history is provided by the patient and the EMS personnel.  Fall  Patient presents after fall.  Reportedly got up to go to the bathroom.  Fell getting back into bed.  Now complaining of pain in right hip.  Shortening and rotation.  Reportedly had previous fall couple weeks ago and hit head.  Does have some bruising in the area.  No neck pain.  Not on anticoagulation.  States he has not seen orthopedic surgery in the past.    Past Medical History:  Diagnosis Date   Diabetes mellitus without complication ([place])   Hyperlipidemia dementia chronic diastolic heart failure pulmonary hypertension complete heart block   Home Medications Prior to Admission medications   Medication Sig Start Date End Date Taking? Authorizing Provider  cholecalciferol (VITAMIN D3) 25 MCG (1000 UNIT) tablet Take 1,000 Units by mouth daily.    [provider]  Cyanocobalamin (VITAMIN B-12 PO) Take 1 tablet by mouth daily.    [provider]  furosemide (LASIX) 20 MG tablet Take 10 mg by mouth daily. 05/19/22   [provider]  LANTUS SOLOSTAR 100 UNIT/ML Solostar Pen Inject 8-9 Units into the skin every evening. 01/21/22   [provider]  magnesium oxide (MAG-OX) 400 MG tablet Take 1 tablet (400 mg total) by mouth daily. Patient taking differently: Take 200 mg by mouth daily. 05/25/22   Donato Heinz, MD  metFORMIN (GLUCOPHAGE-XR) 500 MG 24 hr tablet Take 500 mg by mouth daily with breakfast. 01/05/22   [provider]  potassium chloride (KLOR-CON M) 10 MEQ tablet Take 5 mEq by mouth daily. 05/19/22   [provider]  ramipril (ALTACE) 2.5 MG capsule Take 2.5 mg by mouth daily. 01/05/22   [provider]  SUPER B COMPLEX/C PO  Take 1 tablet by mouth daily.    [provider]  vitamin C (ASCORBIC ACID) 500 MG tablet Take 500 mg by mouth daily.    [provider]  Zinc 30 MG CAPS Take 1 capsule by mouth daily.    [provider]      Allergies    Patient has no known allergies.    Review of Systems   Review of Systems  Physical Exam Updated Vital Signs BP (!) 144/69   Pulse (!) 56   Temp (!) 97.5 F (36.4 C) (Oral)   Resp 12   SpO2 97%  Physical Exam Vitals reviewed.  HENT:     Head:     Comments: Bruising to right periorbital/forehead area.  Likely old.  No tenderness. Cardiovascular:     Rate and Rhythm: Regular rhythm.  Pulmonary:     Breath sounds: No wheezing.  Abdominal:     Tenderness: There is no abdominal tenderness.  Musculoskeletal:        General: Tenderness present.     Cervical back: Neck supple. No tenderness.     Comments: Tenderness right hip laterally.  Decreased range of motion and leg is shortened and externally rotated.  No tenderness over knee.  No lumbar tenderness.  Mild abrasions on forearms and hands without underlying bony tenderness.  Skin:    Capillary Refill: Capillary refill takes less than 2 seconds.  Neurological:     Mental  Status: He is alert.     ED Results / Procedures / Treatments   Labs (all labs ordered are listed, but only abnormal results are displayed) Labs Reviewed  BASIC METABOLIC PANEL - Abnormal; Notable for the following components:      Result Value   Sodium 134 (*)    Potassium 3.4 (*)    Glucose, Bld 219 (*)    Calcium 8.0 (*)    Anion gap 3 (*)    All other components within normal limits  CBC WITH DIFFERENTIAL/PLATELET - Abnormal; Notable for the following components:   RBC 3.23 (*)    Hemoglobin 10.0 (*)    HCT 30.3 (*)    Abs Immature Granulocytes 0.09 (*)    All other components within normal limits  PROTIME-INR  TYPE AND SCREEN    EKG EKG Interpretation  Date/Time:  Sunday September 12 2022  08:42:08 EDT Ventricular Rate:  68 PR Interval:  48 QRS Duration: 84 QT Interval:  412 QTC Calculation: 439 R Axis:   -40 Text Interpretation: Indeterminate rhythm.  Appears to be some P waves but potentially could be junctional escape as he has had in the past. Left axis deviation Anteroseptal infarct, age indeterminate Reconfirmed by Davonna Belling [phone]) on 09/12/2022 9:13:56 AM  Radiology DG Hip Unilat  With Pelvis 2-3 Views Right  Result Date: 09/12/2022 CLINICAL DATA:  Mechanical fall with right hip pain EXAM: DG HIP (WITH OR WITHOUT PELVIS) 2-3V RIGHT COMPARISON:  None Available. FINDINGS: Acute intertrochanteric right femur fracture with varus angulation. Generalized osteopenia. Rectal stool distention. IMPRESSION: 1. Intertrochanteric right femur fracture. 2. Stool distended rectum. Electronically Signed   By: Jorje Guild M.D.   On: 09/12/2022 09:43   DG Chest 1 View  Result Date: 09/12/2022 CLINICAL DATA:  Hip fracture.  Mechanical fall EXAM: CHEST  1 VIEW COMPARISON:  05/20/2022 FINDINGS: Stable heart size and mediastinal contours. Indistinct density behind the heart likely with pleural fluid. No pneumothorax. Benign chondroid matrix in the left humeral shaft. IMPRESSION: Low volume chest with atelectasis or infiltrate at the left base. Electronically Signed   By: Jorje Guild M.D.   On: 09/12/2022 09:28    Procedures Procedures    Medications Ordered in ED Medications  chlorhexidine (HIBICLENS) 4 % liquid 4 Application (has no administration in time range)  povidone-iodine 10 % swab 2 Application (has no administration in time range)  ceFAZolin (ANCEF) IVPB 2g/100 mL premix (has no administration in time range)  tranexamic acid (CYKLOKAPRON) IVPB 1,000 mg (has no administration in time range)    ED Course/ Medical Decision Making/ A&P                           Medical Decision Making Amount and/or Complexity of Data Reviewed Labs: ordered. Radiology:  ordered.   Patient with fall.  Sounds mechanical.  Likely hip fracture.  Has what appears to be old trauma on his face.  However has likely hip fracture.  Will get imaging.  Has not seen Ortho in the past.  EKG shows somewhat indeterminate rhythm.  Has had complete heart block in the past with junctional escape and this could be the rhythm although P waves are difficult to visualize on this twelve-lead.  Has intertrochanteric fracture on right hip on x-ray.  Discussed with Dr. Marlou Sa who would ideally like to operate today, however discussed with Dr. Gifford Shave for anesthesia and would need further cardiac clearance before he would put the  patient under.  Also discussed with Dr. Marylyn Ishihara who will admit patient.  Will discuss with cardiology.  Discussed with Dr. Johney Frame who will see patient in consult today.        Final Clinical Impression(s) / ED Diagnoses Final diagnoses:  Fall, initial encounter  Closed displaced intertrochanteric fracture of right femur, initial encounter ([place])  Complete heart block [facility])    Rx / DC Orders ED Discharge Orders     None         Davonna Belling, MD 09/12/22 1059

## 2022-09-12 NOTE — Anesthesia Procedure Notes (Signed)
Procedure Name: MAC Date/Time: 09/12/2022 11:11 PM  Performed by: Maxwell Caul, CRNAPre-anesthesia Checklist: Emergency Drugs available, Patient identified, Suction available and Patient being monitored Oxygen Delivery Method: Simple face mask

## 2022-09-12 NOTE — Anesthesia Procedure Notes (Addendum)
Spinal  Patient location during procedure: OR Start time: 09/12/2022 11:14 PM End time: 09/12/2022 11:20 PM Reason for block: surgical anesthesia Staffing Performed: anesthesiologist  Anesthesiologist: [name], MD Performed by: [name], MD Authorized by: [name], MD   Preanesthetic Checklist Completed: patient identified, IV checked, risks and benefits discussed, surgical consent, monitors and equipment checked, pre-op evaluation and timeout performed Spinal Block Patient position: right lateral decubitus Prep: DuraPrep and site prepped and draped Patient monitoring: continuous pulse ox and blood pressure Approach: midline Location: L3-4 Injection technique: single-shot Needle Needle type: Quincke  Needle gauge: 22 G Assessment Events: CSF return Additional Notes Functioning IV was confirmed and monitors were applied. Sterile prep and drape, including hand hygiene, mask and sterile gloves were used. The patient was positioned and the spine was prepped. The skin was anesthetized with lidocaine.  Free flow of clear CSF was obtained prior to injecting local anesthetic into the CSF.  The spinal needle aspirated freely following injection.  The needle was carefully withdrawn.  The patient tolerated the procedure well. Consent was obtained prior to procedure with all questions answered and concerns addressed. Risks including but not limited to bleeding, infection, nerve damage, paralysis, failed block, inadequate analgesia, allergic reaction, high spinal, itching and headache were discussed and the patient wished to proceed.   Hoy Morn, MD

## 2022-09-12 NOTE — Consult Note (Signed)
Reason for Consult: Right hip pain Referring Physician: Dr. Jill Poling David Cervantes is an 88 y.o. male.  HPI: David Cervantes is an ambulatory 88 year old patient with right hip pain following an injury earlier today.  He has right hip intertrochanteric fracture.  Denies any other orthopedic complaints.  He does have many other medical problems including complete heart block.  He has been seen by cardiology today.  Past Medical History:  Diagnosis Date   Diabetes mellitus without complication ([place])     No past surgical history on file.  No family history on file.  Social History:  reports that he does not currently use alcohol. He reports that he does not currently use drugs. No history on file for tobacco use.  Allergies:  Allergies  Allergen Reactions   Tape Other (See Comments)    SKIN TEARS AND BRUISES VERY EASILY!!    Medications: I have reviewed the patient's current medications.  Results for orders placed or performed during the hospital encounter of 09/12/22 (from the past 48 hour(s))  Basic metabolic panel     Status: Abnormal   Collection Time: 09/12/22  9:32 AM  Result Value Ref Range   Sodium 134 (L) 135 - 145 mmol/L   Potassium 3.4 (L) 3.5 - 5.1 mmol/L   Chloride 104 98 - 111 mmol/L   CO2 27 22 - 32 mmol/L   Glucose, Bld 219 (H) 70 - 99 mg/dL    Comment: Glucose reference range applies only to samples taken after fasting for at least 8 hours.   BUN 21 8 - 23 mg/dL   Creatinine, Ser 1.09 0.61 - 1.24 mg/dL   Calcium 8.0 (L) 8.9 - 10.3 mg/dL   GFR, Estimated >60 >60 mL/min    Comment: (NOTE) Calculated using the CKD-EPI Creatinine Equation (2021)    Anion gap 3 (L) 5 - 15    Comment: Performed at [facility], [place] [address], [place], [place] [address]  CBC with Differential     Status: Abnormal   Collection Time: 09/12/22  9:32 AM  Result Value Ref Range   WBC 9.7 4.0 - 10.5 K/uL   RBC 3.23 (L) 4.22 - 5.81 MIL/uL   Hemoglobin 10.0 (L) 13.0 - 17.0  g/dL   HCT 30.3 (L) 39.0 - 52.0 %   MCV 93.8 80.0 - 100.0 fL   MCH 31.0 26.0 - 34.0 pg   MCHC 33.0 30.0 - 36.0 g/dL   RDW 13.2 11.5 - 15.5 %   Platelets 198 150 - 400 K/uL   nRBC 0.0 0.0 - 0.2 %   Neutrophils Relative % 79 %   Neutro Abs 7.7 1.7 - 7.7 K/uL   Lymphocytes Relative 10 %   Lymphs Abs 0.9 0.7 - 4.0 K/uL   Monocytes Relative 9 %   Monocytes Absolute 0.8 0.1 - 1.0 K/uL   Eosinophils Relative 1 %   Eosinophils Absolute 0.1 0.0 - 0.5 K/uL   Basophils Relative 0 %   Basophils Absolute 0.0 0.0 - 0.1 K/uL   Immature Granulocytes 1 %   Abs Immature Granulocytes 0.09 (H) 0.00 - 0.07 K/uL    Comment: Performed at [facility], [place] [address] [place],  [address]  Protime-INR     Status: None   Collection Time: 09/12/22  9:32 AM  Result Value Ref Range   Prothrombin Time 14.7 11.4 - 15.2 seconds   INR 1.2 0.8 - 1.2    Comment: (NOTE) INR goal varies based on  device and disease states. Performed at [facility], [place] [address], [place], [place] [address]   Type and screen [place]     Status: None   Collection Time: 09/12/22  9:44 AM  Result Value Ref Range   ABO/RH(D) A POS    Antibody Screen NEG    Sample Expiration      09/15/2022,2359 Performed at [facility], [place] [address], [place], [place] [address]   Magnesium     Status: None   Collection Time: 09/12/22  9:45 AM  Result Value Ref Range   Magnesium 2.0 1.7 - 2.4 mg/dL    Comment: Performed at [facility], [place] [address] [place], [place] [address]  Albumin     Status: Abnormal   Collection Time: 09/12/22 12:59 PM  Result Value Ref Range   Albumin 2.5 (L) 3.5 - 5.0 g/dL    Comment: Performed at [facility], [place] [address], [place], [place] [address]  ABO/Rh     Status: None   Collection Time: 09/12/22 12:59 PM  Result Value Ref Range   ABO/RH(D)      A POS Performed at [facility], [place] [address] [place], [place] [address]   Hemoglobin A1c     Status: Abnormal   Collection Time: 09/12/22 12:59 PM  Result Value Ref Range   Hgb A1c MFr Bld 9.8 (H) 4.8 - 5.6 %    Comment: (NOTE) Pre diabetes:          5.7%-6.4%  Diabetes:              >6.4%  Glycemic control for   <7.0% adults with diabetes    Mean Plasma Glucose 234.56 mg/dL    Comment: Performed at [facility], [place] [address], [place], [place] [address]  Glucose, capillary     Status: Abnormal   Collection Time: 09/12/22  1:15 PM  Result Value Ref Range   Glucose-Capillary 206 (H) 70 - 99 mg/dL    Comment: Glucose reference range applies only to samples taken after fasting for at least 8 hours.  Glucose, capillary     Status: Abnormal   Collection Time: 09/12/22  4:07 PM  Result Value Ref Range   Glucose-Capillary 271 (H) 70 - 99 mg/dL    Comment: Glucose reference range applies only to samples taken after fasting for at least 8 hours.    DG Hip Unilat  With Pelvis 2-3 Views Right  Result Date: 09/12/2022 CLINICAL DATA:  Mechanical fall with right hip pain EXAM: DG HIP (WITH OR WITHOUT PELVIS) 2-3V RIGHT COMPARISON:  None Available. FINDINGS: Acute intertrochanteric right femur fracture with varus angulation. Generalized osteopenia. Rectal stool distention. IMPRESSION: 1. Intertrochanteric right femur fracture. 2. Stool distended rectum. Electronically Signed   By: Jorje Guild M.D.   On: 09/12/2022 09:43   DG Chest 1 View  Result Date: 09/12/2022 CLINICAL DATA:  Hip fracture.  Mechanical fall EXAM: CHEST  1 VIEW COMPARISON:  05/20/2022 FINDINGS: Stable heart size and mediastinal contours. Indistinct density behind the heart likely with pleural fluid. No pneumothorax. Benign chondroid matrix in the left humeral shaft. IMPRESSION: Low volume chest with atelectasis or infiltrate at the left base. Electronically Signed   By: Jorje Guild M.D.   On: 09/12/2022 09:28    Review of  Systems  Musculoskeletal:  Positive for arthralgias.  All other systems reviewed and are negative.  Blood pressure (!) 134/102, pulse  62, temperature 98.2 F (36.8 C), resp. rate 20, SpO2 96 %. Physical Exam Vitals reviewed.  HENT:     Head: Normocephalic.     Nose: Nose normal.     Mouth/Throat:     Mouth: Mucous membranes are moist.  Eyes:     Pupils: Pupils are equal, round, and reactive to light.  Cardiovascular:     Rate and Rhythm: Normal rate.     Pulses: Normal pulses.  Pulmonary:     Effort: Pulmonary effort is normal.  Abdominal:     General: Abdomen is flat.  Musculoskeletal:     Cervical back: Normal range of motion.  Skin:    General: Skin is warm.     Capillary Refill: Capillary refill takes less than 2 seconds.  Neurological:     General: No focal deficit present.     Mental Status: He is alert.  Psychiatric:        Mood and Affect: Mood normal.   Examination of the upper extremities demonstrates reasonable range of motion of bilateral wrist elbows and shoulders with good grip strength and palpable radial pulses bilaterally.  He does have a lot of right groin pain with internal/external rotation of the right leg.  Both feet are perfused and sensate.  Trace right knee effusion present.  No left knee effusion.  No left lower extremity pain with range of motion with hip knee or ankle range of motion.  Assessment/Plan: Impression is right hip intertrochanteric fracture in a patient with multiple medical comorbidities.  The risk and benefits of surgical treatment are discussed with the patient including not limited to infection or vessel damage nonunion malunion as well as the potential adverse medical consequences from the stress of surgery including heart attack stroke as well as heart rhythm problems.  He is on the medical service.  He has been medically optimized.  All questions answered.  Landry Dyke Betzalel Umbarger 09/12/2022, 10:19 PM

## 2022-09-12 NOTE — Consult Note (Signed)
CONSULTATION NOTE   Patient Name: David Cervantes Date of Encounter: 09/12/2022 Cardiologist: Donato Heinz, MD Electrophysiologist: None Advanced Heart Failure: None   Chief Complaint   Fall, perioperative risk assessment  Patient Profile   88 yo male with chronic diastolic HF, PAF, DM, HLD, and dementia, presents with fall and hip fracture, noted to have bradycardia/possibly heart block, for perioperative risk evaluation  HPI   David Cervantes is a 88 y.o. male who is being seen today for the evaluation of perioperative risk at the request of Dr. Marylyn Ishihara. This is a 88 year old male patient followed by Dr. Gardiner Rhyme, with a history of chronic diastolic heart failure, paroxysmal atrial fibrillation, severe pulmonary hypertension, type 2 diabetes, dyslipidemia and dementia.  He presents at home after a fall and is found to have a hip fracture.  He was seen by Dr. Gardiner Rhyme in the office on 8/3 - a rhythm strip suggested complete heart block.  I have reviewed his EKG images personally along with Dr. Myles Gip (cardiac EP) and the rhythm appears to be sinus with competing junctional rhythm.  Similar to the EKG today, but slower. Cardiology was asked to assess for risk of surgery.  Currently he denies any chest pain or worsening shortness of breath.  PMHx   Past Medical History:  Diagnosis Date   Diabetes mellitus without complication ([place])     No past surgical history on file.  FAMHx   Family history noncontributory  SOCHx    reports that he does not currently use alcohol. He reports that he does not currently use drugs. No history on file for tobacco use.  Outpatient Medications   No current facility-administered medications on file prior to encounter.   Current Outpatient Medications on File Prior to Encounter  Medication Sig Dispense Refill   cholecalciferol (VITAMIN D3) 25 MCG (1000 UNIT) tablet Take 1,000 Units by mouth daily.     Cyanocobalamin (VITAMIN  B-12 PO) Take 1 tablet by mouth daily.     furosemide (LASIX) 20 MG tablet Take 10 mg by mouth daily.     LANTUS SOLOSTAR 100 UNIT/ML Solostar Pen Inject 8-9 Units into the skin every evening.     magnesium oxide (MAG-OX) 400 MG tablet Take 1 tablet (400 mg total) by mouth daily. (Patient taking differently: Take 200 mg by mouth daily.) 30 tablet 3   metFORMIN (GLUCOPHAGE-XR) 500 MG 24 hr tablet Take 500 mg by mouth daily with breakfast.     potassium chloride (KLOR-CON M) 10 MEQ tablet Take 5 mEq by mouth daily.     ramipril (ALTACE) 2.5 MG capsule Take 2.5 mg by mouth daily.     SUPER B COMPLEX/C PO Take 1 tablet by mouth daily.     vitamin C (ASCORBIC ACID) 500 MG tablet Take 500 mg by mouth daily.     Zinc 30 MG CAPS Take 1 capsule by mouth daily.      Inpatient Medications    Scheduled Meds:  chlorhexidine  60 mL Topical Once   povidone-iodine  2 Application Topical Once    Continuous Infusions:   ceFAZolin (ANCEF) IV     tranexamic acid      PRN Meds:    ALLERGIES   No Known Allergies  ROS   Pertinent items noted in HPI and remainder of comprehensive ROS otherwise negative.  Vitals   Vitals:   09/12/22 1045 09/12/22 1100 09/12/22 1115 09/12/22 1215  BP: (!) 145/55 (!) 145/68 (!) 141/44 (!) 145/63  Pulse: (!) 47 (!) 47 (!) 46 (!) 40  Resp: 16 14 (!) 8 10  Temp:    (!) 97.3 F (36.3 C)  TempSrc:    Oral  SpO2: 98% 99% 97% 98%   No intake or output data in the 24 hours ending 09/12/22 1231 There were no vitals filed for this visit.  Physical Exam   General appearance: alert, appears stated age, no distress, and pale Neck: JVD - a few cm above sternal notch, no carotid bruit, and thyroid not enlarged, symmetric, no tenderness/mass/nodules Lungs: diminished breath sounds bibasilar Heart: regular bradycardia, s1/loud P2. Abdomen: soft, non-tender; bowel sounds normal; no masses,  no organomegaly Extremities: edema trace LE bilateral Pulses: 2+ and  symmetric Skin: pale, warm, dry Neurologic: Mental status: Alert, oriented, thought content appropriate, very hard of hearing Psych: Pleasant  Labs   Results for orders placed or performed during the hospital encounter of 09/12/22 (from the past 48 hour(s))  Basic metabolic panel     Status: Abnormal   Collection Time: 09/12/22  9:32 AM  Result Value Ref Range   Sodium 134 (L) 135 - 145 mmol/L   Potassium 3.4 (L) 3.5 - 5.1 mmol/L   Chloride 104 98 - 111 mmol/L   CO2 27 22 - 32 mmol/L   Glucose, Bld 219 (H) 70 - 99 mg/dL    Comment: Glucose reference range applies only to samples taken after fasting for at least 8 hours.   BUN 21 8 - 23 mg/dL   Creatinine, Ser 1.09 0.61 - 1.24 mg/dL   Calcium 8.0 (L) 8.9 - 10.3 mg/dL   GFR, Estimated >60 >60 mL/min    Comment: (NOTE) Calculated using the CKD-EPI Creatinine Equation (2021)    Anion gap 3 (L) 5 - 15    Comment: Performed at [facility], [place] [address] [place], [place] [address]  CBC with Differential     Status: Abnormal   Collection Time: 09/12/22  9:32 AM  Result Value Ref Range   WBC 9.7 4.0 - 10.5 K/uL   RBC 3.23 (L) 4.22 - 5.81 MIL/uL   Hemoglobin 10.0 (L) 13.0 - 17.0 g/dL   HCT 30.3 (L) 39.0 - 52.0 %   MCV 93.8 80.0 - 100.0 fL   MCH 31.0 26.0 - 34.0 pg   MCHC 33.0 30.0 - 36.0 g/dL   RDW 13.2 11.5 - 15.5 %   Platelets 198 150 - 400 K/uL   nRBC 0.0 0.0 - 0.2 %   Neutrophils Relative % 79 %   Neutro Abs 7.7 1.7 - 7.7 K/uL   Lymphocytes Relative 10 %   Lymphs Abs 0.9 0.7 - 4.0 K/uL   Monocytes Relative 9 %   Monocytes Absolute 0.8 0.1 - 1.0 K/uL   Eosinophils Relative 1 %   Eosinophils Absolute 0.1 0.0 - 0.5 K/uL   Basophils Relative 0 %   Basophils Absolute 0.0 0.0 - 0.1 K/uL   Immature Granulocytes 1 %   Abs Immature Granulocytes 0.09 (H) 0.00 - 0.07 K/uL    Comment: Performed at [facility], [place] [address] [place], [place] [address]  Protime-INR     Status: None    Collection Time: 09/12/22  9:32 AM  Result Value Ref Range   Prothrombin Time 14.7 11.4 - 15.2 seconds   INR 1.2 0.8 - 1.2    Comment: (NOTE) INR goal varies based on device and disease states. Performed at [facility], [place] [address]  [place], [place] [address]   Type and screen [place]     Status: None   Collection Time: 09/12/22  9:44 AM  Result Value Ref Range   ABO/RH(D) A POS    Antibody Screen NEG    Sample Expiration      09/15/2022,2359 Performed at [facility], [place] [address], [place], [place] [address]     ECG   Sinus rhythm, 1st degree AVB with competing junctional rhythm- Personally Reviewed  Telemetry   Sinus rhythm- Personally Reviewed  Radiology   DG Hip Unilat  With Pelvis 2-3 Views Right  Result Date: 09/12/2022 CLINICAL DATA:  Mechanical fall with right hip pain EXAM: DG HIP (WITH OR WITHOUT PELVIS) 2-3V RIGHT COMPARISON:  None Available. FINDINGS: Acute intertrochanteric right femur fracture with varus angulation. Generalized osteopenia. Rectal stool distention. IMPRESSION: 1. Intertrochanteric right femur fracture. 2. Stool distended rectum. Electronically Signed   By: Jorje Guild M.D.   On: 09/12/2022 09:43   DG Chest 1 View  Result Date: 09/12/2022 CLINICAL DATA:  Hip fracture.  Mechanical fall EXAM: CHEST  1 VIEW COMPARISON:  05/20/2022 FINDINGS: Stable heart size and mediastinal contours. Indistinct density behind the heart likely with pleural fluid. No pneumothorax. Benign chondroid matrix in the left humeral shaft. IMPRESSION: Low volume chest with atelectasis or infiltrate at the left base. Electronically Signed   By: Jorje Guild M.D.   On: 09/12/2022 09:28    Cardiac Studies   N/A  Impression   Principal Problem:   Closed right hip fracture (HCC) Active Problems:   Dementia (HCC)   DM (diabetes mellitus) (HCC)   HTN (hypertension)   Chronic diastolic CHF (congestive  heart failure) (HCC)   Complete heart block (HCC)   Hypokalemia   Normocytic anemia   Recommendation   High risk for surgery given severe pulmonary hypertension and age.  I have reviewed his EKG and discussed with our weekend electrophysiologist who feels this is sinus rhythm with a competing junctional rhythm. The office EKG may be sinus bradycardia with 1st degree AVB and competing junctional rhythm. Was scheduled to see EP as outpatient.  Would recommend placing ZOLL pads prior to surgery if there is concern for perioperative heart block.  Avoid AVN blocking medications. Obviously this is not elective surgery and without surgery his outcome would be worse.  I do not feel that he is at prohibitive risk for surgery.  Would recommend proceeding to the OR. D/w patient and family present and they are in agreement.  Cardiology will be available to support if necessary.  Thanks for the consultation.  We will follow with you.  Time Spent Directly with Patient:  I have spent a total of 45 minutes with the patient reviewing hospital notes, telemetry, EKGs, labs and examining the patient as well as establishing an assessment and plan that was discussed personally with the patient.  > 50% of time was spent in direct patient care.  Length of Stay:  LOS: 0 days   Pixie Casino, MD, [facility] [place] Director of the Advanced Lipid Disorders &  Cardiovascular Risk Reduction Clinic Diplomate of the American Board of Clinical Lipidology Attending Cardiologist  Direct Dial: [phone]  Fax: [phone]  Website:  [email or web address] 09/12/2022, 12:31 PM

## 2022-09-12 NOTE — Anesthesia Procedure Notes (Signed)
Arterial Line Insertion Start/End10/12/2021 10:05 PM, 09/12/2022 10:12 PM Performed by: [name], MD, anesthesiologist  Patient location: Pre-op. Preanesthetic checklist: patient identified, IV checked, site marked, risks and benefits discussed, surgical consent, monitors and equipment checked, pre-op evaluation, timeout performed and anesthesia consent Lidocaine 1% used for infiltration Left, radial was placed Catheter size: 20 G Hand hygiene performed  and maximum sterile barriers used   Attempts: 1 Procedure performed using ultrasound guided technique. Ultrasound Notes:anatomy identified, needle tip was noted to be adjacent to the nerve/plexus identified and no ultrasound evidence of intravascular and/or intraneural injection Following insertion, dressing applied and Biopatch. Post procedure assessment: normal and unchanged  Patient tolerated the procedure well with no immediate complications.

## 2022-09-12 NOTE — ED Triage Notes (Signed)
Pt BIBA from home for mechanical fall, shortening and rotation noted to right leg. C/o pain right hip. Skin tears to bilateral hands. Old bruising on right eye from fall a week ago. Denies head trauma. No thinners listed in PTA meds. 20ga RAC 23mcg fentanyl  170/80 HR 60-80 CBG 291 hx dm 97% RA

## 2022-09-12 NOTE — H&P (Signed)
History and Physical    Patient: David Cervantes ZOX:[id number] DOB: December 21, 1926 DOA: 09/12/2022 DOS: the patient was seen and examined on 09/12/2022 PCP: [name], MD  Patient coming from: Home  Chief Complaint:  Chief Complaint  Patient presents with   Fall   HPI: [name] is a 88 y.o. male with medical history significant of chronic diastolic HF, HLD, dementia, PAH, DM2. Presenting with right hip pain after fall. He reports that he was coming back from the bathroom when he suddenly fell. He didn't have any chest pain or palpitations prior to his fall. It was dark and he believes he may have tripped over something but he can not say. He denies head injury -- although he did have a fall within the last couple of weeks where he did hit his head -- or LOC. He had difficulty getting up, so EMS was alerted. He denies any other aggravating or alleviating factors.   Review of Systems: As mentioned in the history of present illness. All other systems reviewed and are negative. Past Medical History:  Diagnosis Date   Diabetes mellitus without complication ([place])    PSHx No pertinent surgical history..  Social History:  reports that he does not currently use alcohol. He reports that he does not currently use drugs. No history on file for tobacco use.  No Known Allergies  FamHx Reviewed. Non-contributory.  Prior to Admission medications   Medication Sig Start Date End Date Taking? Authorizing Provider  cholecalciferol (VITAMIN D3) 25 MCG (1000 UNIT) tablet Take 1,000 Units by mouth daily.    [provider]  Cyanocobalamin (VITAMIN B-12 PO) Take 1 tablet by mouth daily.    [provider]  furosemide (LASIX) 20 MG tablet Take 10 mg by mouth daily. 05/19/22   [provider]  LANTUS SOLOSTAR 100 UNIT/ML Solostar Pen Inject 8-9 Units into the skin every evening. 01/21/22   [provider]  magnesium oxide (MAG-OX) 400 MG tablet Take 1 tablet  (400 mg total) by mouth daily. Patient taking differently: Take 200 mg by mouth daily. 05/25/22   Donato Heinz, MD  metFORMIN (GLUCOPHAGE-XR) 500 MG 24 hr tablet Take 500 mg by mouth daily with breakfast. 01/05/22   [provider]  potassium chloride (KLOR-CON M) 10 MEQ tablet Take 5 mEq by mouth daily. 05/19/22   [provider]  ramipril (ALTACE) 2.5 MG capsule Take 2.5 mg by mouth daily. 01/05/22   [provider]  SUPER B COMPLEX/C PO Take 1 tablet by mouth daily.    [provider]  vitamin C (ASCORBIC ACID) 500 MG tablet Take 500 mg by mouth daily.    [provider]  Zinc 30 MG CAPS Take 1 capsule by mouth daily.    [provider]    Physical Exam: Vitals:   09/12/22 0842 09/12/22 0930  BP: (!) 167/88 (!) 144/69  Pulse: 69 (!) 56  Resp: 10 12  Temp: (!) 97.5 F (36.4 C)   TempSrc: Oral   SpO2: 98% 97%   General: 88 y.o. male resting in bed in NAD Eyes: PERRL, normal sclera; bruising around right eye from previous fall ENMT: Nares patent w/o discharge, orophaynx clear, dentition normal, ears w/o discharge/lesions/ulcers Neck: Supple, trachea midline Cardiovascular: brady, +S1, S2, no g/r, 1/6 SEM, equal pulses throughout Respiratory: CTABL, no w/r/r, normal WOB GI: BS+, NDNT, no masses noted, no organomegaly noted MSK: No c/c; BLE edema (chronic per family); limited ROM of right hip d/t  pain Neuro: A&O x 2 (name, place), no focal deficits Psyc: Appropriate interaction and affect, calm/cooperative  Data Reviewed:  Results for orders placed or performed during the hospital encounter of 09/12/22 (from the past 24 hour(s))  Basic metabolic panel     Status: Abnormal   Collection Time: 09/12/22  9:32 AM  Result Value Ref Range   Sodium 134 (L) 135 - 145 mmol/L   Potassium 3.4 (L) 3.5 - 5.1 mmol/L   Chloride 104 98 - 111 mmol/L   CO2 27 22 - 32 mmol/L   Glucose, Bld 219 (H) 70 - 99 mg/dL   BUN 21 8 - 23 mg/dL    Creatinine, Ser 1.09 0.61 - 1.24 mg/dL   Calcium 8.0 (L) 8.9 - 10.3 mg/dL   GFR, Estimated >60 >60 mL/min   Anion gap 3 (L) 5 - 15  CBC with Differential     Status: Abnormal   Collection Time: 09/12/22  9:32 AM  Result Value Ref Range   WBC 9.7 4.0 - 10.5 K/uL   RBC 3.23 (L) 4.22 - 5.81 MIL/uL   Hemoglobin 10.0 (L) 13.0 - 17.0 g/dL   HCT 30.3 (L) 39.0 - 52.0 %   MCV 93.8 80.0 - 100.0 fL   MCH 31.0 26.0 - 34.0 pg   MCHC 33.0 30.0 - 36.0 g/dL   RDW 13.2 11.5 - 15.5 %   Platelets 198 150 - 400 K/uL   nRBC 0.0 0.0 - 0.2 %   Neutrophils Relative % 79 %   Neutro Abs 7.7 1.7 - 7.7 K/uL   Lymphocytes Relative 10 %   Lymphs Abs 0.9 0.7 - 4.0 K/uL   Monocytes Relative 9 %   Monocytes Absolute 0.8 0.1 - 1.0 K/uL   Eosinophils Relative 1 %   Eosinophils Absolute 0.1 0.0 - 0.5 K/uL   Basophils Relative 0 %   Basophils Absolute 0.0 0.0 - 0.1 K/uL   Immature Granulocytes 1 %   Abs Immature Granulocytes 0.09 (H) 0.00 - 0.07 K/uL  Protime-INR     Status: None   Collection Time: 09/12/22  9:32 AM  Result Value Ref Range   Prothrombin Time 14.7 11.4 - 15.2 seconds   INR 1.2 0.8 - 1.2   CXR: Low volume chest with atelectasis or infiltrate at the left base.  XR Right hip 1. Intertrochanteric right femur fracture. 2. Stool distended rectum.  EKG: ?jxnal escape? No st elevations  Assessment and Plan: Right hip fracture Fall     - admit to inpt, tele     - orthopedics consulted; appreciate assistance, plan of OR after cards eval     - PT/OT after procedure     - pain control   Complete heart block     - started w/u outpt recently     - cards consulted, appreciate assistance  Hypokalemia     - replace K+; check [id number]  Chronic diastolic HF     - BLE edema; but chronic     - continue home regimen when confirmed and off NPO status  DM2     - A1c, SSI, glucose checks     - DM diet when off NPO status  Dementia     - continue home regimen when confirmed and off NPO  status  HTN     - continue home regimen when confirmed and off NPO status  Normocytic anemia     - at baseline     - trend for now  Advance  Care Planning:   Code Status: FULL  Consults: Cardiology (Dr. Johney Frame), Orthopedics (Dr. Marlou Sa)  Family Communication: w/ daughter at bedside  Severity of Illness: The appropriate patient status for this patient is INPATIENT. Inpatient status is judged to be reasonable and necessary in order to provide the required intensity of service to ensure the patient's safety. The patient's presenting symptoms, physical exam findings, and initial radiographic and laboratory data in the context of their chronic comorbidities is felt to place them at high risk for further clinical deterioration. Furthermore, it is not anticipated that the patient will be medically stable for discharge from the hospital within 2 midnights of admission.   * I certify that at the point of admission it is my clinical judgment that the patient will require inpatient hospital care spanning beyond 2 midnights from the point of admission due to high intensity of service, high risk for further deterioration and high frequency of surveillance required.*  Time spent in coordination of this H&P: 54  minutes  Author: Jonnie Finner, DO 09/12/2022 10:47 AM  For on call review [email or web address].

## 2022-09-12 NOTE — ED Notes (Signed)
Called upstairs for purple man, no answer

## 2022-09-13 ENCOUNTER — Encounter (HOSPITAL_COMMUNITY): Payer: Self-pay | Admitting: Orthopedic Surgery

## 2022-09-13 ENCOUNTER — Other Ambulatory Visit: Payer: Self-pay

## 2022-09-13 ENCOUNTER — Inpatient Hospital Stay (HOSPITAL_COMMUNITY): Payer: Medicare PPO

## 2022-09-13 DIAGNOSIS — S72001A Fracture of unspecified part of neck of right femur, initial encounter for closed fracture: Secondary | ICD-10-CM | POA: Diagnosis not present

## 2022-09-13 DIAGNOSIS — I5032 Chronic diastolic (congestive) heart failure: Secondary | ICD-10-CM | POA: Diagnosis not present

## 2022-09-13 DIAGNOSIS — I442 Atrioventricular block, complete: Secondary | ICD-10-CM | POA: Diagnosis not present

## 2022-09-13 DIAGNOSIS — E43 Unspecified severe protein-calorie malnutrition: Secondary | ICD-10-CM | POA: Insufficient documentation

## 2022-09-13 LAB — BASIC METABOLIC PANEL
Anion gap: 9 (ref 5–15)
BUN: 22 mg/dL (ref 8–23)
CO2: 23 mmol/L (ref 22–32)
Calcium: 8.1 mg/dL — ABNORMAL LOW (ref 8.9–10.3)
Chloride: 102 mmol/L (ref 98–111)
Creatinine, Ser: 1.1 mg/dL (ref 0.61–1.24)
GFR, Estimated: >60 mL/min (ref >60)
Glucose, Bld: 274 mg/dL — ABNORMAL HIGH (ref 70–99)
Potassium: 4.1 mmol/L (ref 3.5–5.1)
Sodium: 134 mmol/L — ABNORMAL LOW (ref 135–145)

## 2022-09-13 LAB — GLUCOSE, CAPILLARY
Glucose-Capillary: 172 mg/dL — ABNORMAL HIGH (ref 70–99)
Glucose-Capillary: 196 mg/dL — ABNORMAL HIGH (ref 70–99)
Glucose-Capillary: 257 mg/dL — ABNORMAL HIGH (ref 70–99)
Glucose-Capillary: 278 mg/dL — ABNORMAL HIGH (ref 70–99)
Glucose-Capillary: 280 mg/dL — ABNORMAL HIGH (ref 70–99)

## 2022-09-13 LAB — CBC
HCT: 31.1 % — ABNORMAL LOW (ref 39.0–52.0)
Hemoglobin: 10.1 g/dL — ABNORMAL LOW (ref 13.0–17.0)
MCH: 30.6 pg (ref 26.0–34.0)
MCHC: 32.5 g/dL (ref 30.0–36.0)
MCV: 94.2 fL (ref 80.0–100.0)
Platelets: 230 10*3/uL (ref 150–400)
RBC: 3.3 MIL/uL — ABNORMAL LOW (ref 4.22–5.81)
RDW: 13.2 % (ref 11.5–15.5)
WBC: 12.2 10*3/uL — ABNORMAL HIGH (ref 4.0–10.5)
nRBC: 0 % (ref 0.0–0.2)

## 2022-09-13 MED ORDER — ONDANSETRON HCL 4 MG/2ML IJ SOLN: 4.0000 mg | Freq: Four times a day (QID) | INTRAMUSCULAR | Status: DC | PRN

## 2022-09-13 MED ORDER — TRANEXAMIC ACID-NACL 1000-0.7 MG/100ML-% IV SOLN
1000.0000 mg | Freq: Once | INTRAVENOUS | Status: AC
  Administered 2022-09-13: 1000 mg via INTRAVENOUS
  Filled 2022-09-13: qty 100

## 2022-09-13 MED ORDER — B COMPLEX-C PO TABS
1.0000 | ORAL_TABLET | Freq: Every day | ORAL | Status: DC
  Administered 2022-09-13 – 2022-09-20 (×8): 1 via ORAL
  Filled 2022-09-13 (×9): qty 1

## 2022-09-13 MED ORDER — FUROSEMIDE 20 MG PO TABS
10.0000 mg | ORAL_TABLET | Freq: Every morning | ORAL | Status: DC
  Administered 2022-09-14 – 2022-09-20 (×7): 10 mg via ORAL
  Filled 2022-09-13 (×8): qty 1

## 2022-09-13 MED ORDER — GLUCERNA SHAKE PO LIQD
237.0000 mL | Freq: Three times a day (TID) | ORAL | Status: DC
  Administered 2022-09-13 – 2022-09-20 (×15): 237 mL via ORAL
  Filled 2022-09-13 (×23): qty 237

## 2022-09-13 MED ORDER — MORPHINE SULFATE (PF) 2 MG/ML IV SOLN: 0.5000 mg | INTRAVENOUS | Status: DC | PRN

## 2022-09-13 MED ORDER — MAGNESIUM OXIDE -MG SUPPLEMENT 400 (240 MG) MG PO TABS
200.0000 mg | ORAL_TABLET | Freq: Every day | ORAL | Status: DC
  Administered 2022-09-13 – 2022-09-20 (×8): 200 mg via ORAL
  Filled 2022-09-13 (×8): qty 1

## 2022-09-13 MED ORDER — INSULIN GLARGINE-YFGN 100 UNIT/ML ~~LOC~~ SOLN
5.0000 [IU] | Freq: Two times a day (BID) | SUBCUTANEOUS | Status: DC
  Administered 2022-09-13: 5 [IU] via SUBCUTANEOUS
  Filled 2022-09-13 (×2): qty 0.05

## 2022-09-13 MED ORDER — RAMIPRIL 1.25 MG PO CAPS
2.5000 mg | ORAL_CAPSULE | Freq: Every day | ORAL | Status: DC
  Administered 2022-09-14 – 2022-09-20 (×7): 2.5 mg via ORAL
  Filled 2022-09-13 (×5): qty 2
  Filled 2022-09-13: qty 1
  Filled 2022-09-13 (×3): qty 2

## 2022-09-13 MED ORDER — ONDANSETRON HCL 4 MG PO TABS: 4.0000 mg | ORAL_TABLET | Freq: Four times a day (QID) | ORAL | Status: DC | PRN

## 2022-09-13 MED ORDER — HYDROCODONE-ACETAMINOPHEN 5-325 MG PO TABS
1.0000 | ORAL_TABLET | Freq: Four times a day (QID) | ORAL | Status: DC | PRN
  Filled 2022-09-13 (×2): qty 1

## 2022-09-13 MED ORDER — METOCLOPRAMIDE HCL 5 MG/ML IJ SOLN: 5.0000 mg | Freq: Three times a day (TID) | INTRAMUSCULAR | Status: DC | PRN

## 2022-09-13 MED ORDER — ACETAMINOPHEN 325 MG PO TABS
325.0000 mg | ORAL_TABLET | Freq: Four times a day (QID) | ORAL | Status: DC | PRN
  Administered 2022-09-17 (×2): 650 mg via ORAL
  Administered 2022-09-18: 325 mg via ORAL
  Administered 2022-09-19 (×2): 650 mg via ORAL
  Filled 2022-09-13 (×2): qty 2
  Filled 2022-09-13: qty 1
  Filled 2022-09-13 (×2): qty 2

## 2022-09-13 MED ORDER — METHOCARBAMOL 1000 MG/10ML IJ SOLN: 500.0000 mg | Freq: Four times a day (QID) | INTRAVENOUS | Status: DC | PRN

## 2022-09-13 MED ORDER — MENTHOL 3 MG MT LOZG: 1.0000 | LOZENGE | OROMUCOSAL | Status: DC | PRN

## 2022-09-13 MED ORDER — SODIUM CHLORIDE 0.9 % IV SOLN: INTRAVENOUS | Status: AC

## 2022-09-13 MED ORDER — POTASSIUM CHLORIDE CRYS ER 10 MEQ PO TBCR
5.0000 meq | EXTENDED_RELEASE_TABLET | Freq: Every day | ORAL | Status: DC
  Administered 2022-09-15 – 2022-09-19 (×5): 5 meq via ORAL
  Filled 2022-09-13 (×7): qty 1

## 2022-09-13 MED ORDER — CEFAZOLIN SODIUM-DEXTROSE 2-4 GM/100ML-% IV SOLN
2.0000 g | Freq: Three times a day (TID) | INTRAVENOUS | Status: AC
  Administered 2022-09-13 (×2): 2 g via INTRAVENOUS
  Filled 2022-09-13 (×2): qty 100

## 2022-09-13 MED ORDER — ZINC SULFATE 220 (50 ZN) MG PO CAPS
220.0000 mg | ORAL_CAPSULE | Freq: Every day | ORAL | Status: DC
  Administered 2022-09-14 – 2022-09-20 (×7): 220 mg via ORAL
  Filled 2022-09-13 (×7): qty 1

## 2022-09-13 MED ORDER — ACETAMINOPHEN 500 MG PO TABS
500.0000 mg | ORAL_TABLET | Freq: Four times a day (QID) | ORAL | Status: AC
  Administered 2022-09-13 (×3): 500 mg via ORAL
  Filled 2022-09-13 (×3): qty 1

## 2022-09-13 MED ORDER — VITAMIN C 500 MG PO TABS
500.0000 mg | ORAL_TABLET | Freq: Every day | ORAL | Status: DC
  Administered 2022-09-13 – 2022-09-20 (×8): 500 mg via ORAL
  Filled 2022-09-13 (×8): qty 1

## 2022-09-13 MED ORDER — METHOCARBAMOL 500 MG PO TABS
500.0000 mg | ORAL_TABLET | Freq: Four times a day (QID) | ORAL | Status: DC | PRN
  Administered 2022-09-17 – 2022-09-19 (×5): 500 mg via ORAL
  Filled 2022-09-13 (×5): qty 1

## 2022-09-13 MED ORDER — METFORMIN HCL ER 500 MG PO TB24
500.0000 mg | ORAL_TABLET | Freq: Every day | ORAL | Status: DC
  Administered 2022-09-14 – 2022-09-20 (×7): 500 mg via ORAL
  Filled 2022-09-13 (×7): qty 1

## 2022-09-13 MED ORDER — MAGNESIUM OXIDE 400 MG PO TABS
200.0000 mg | ORAL_TABLET | Freq: Every day | ORAL | Status: DC
  Filled 2022-09-13: qty 0.5

## 2022-09-13 MED ORDER — PHENOL 1.4 % MT LIQD: 1.0000 | OROMUCOSAL | Status: DC | PRN

## 2022-09-13 MED ORDER — ASPIRIN 81 MG PO CHEW
81.0000 mg | CHEWABLE_TABLET | Freq: Two times a day (BID) | ORAL | Status: DC
  Administered 2022-09-13 – 2022-09-20 (×14): 81 mg via ORAL
  Filled 2022-09-13 (×15): qty 1

## 2022-09-13 MED ORDER — VITAMIN D 25 MCG (1000 UNIT) PO TABS
1000.0000 [IU] | ORAL_TABLET | ORAL | Status: DC
  Administered 2022-09-13 – 2022-09-20 (×4): 1000 [IU] via ORAL
  Filled 2022-09-13 (×5): qty 1

## 2022-09-13 MED ORDER — ZINC 30 MG PO CAPS: 30.0000 mg | ORAL_CAPSULE | ORAL | Status: DC

## 2022-09-13 MED ORDER — DOCUSATE SODIUM 100 MG PO CAPS
100.0000 mg | ORAL_CAPSULE | Freq: Two times a day (BID) | ORAL | Status: DC
  Administered 2022-09-13 – 2022-09-18 (×9): 100 mg via ORAL
  Filled 2022-09-13 (×13): qty 1

## 2022-09-13 MED ORDER — METOCLOPRAMIDE HCL 5 MG PO TABS: 5.0000 mg | ORAL_TABLET | Freq: Three times a day (TID) | ORAL | Status: DC | PRN

## 2022-09-13 NOTE — Transfer of Care (Signed)
Immediate Anesthesia Transfer of Care Note  Patient: David Cervantes  Procedure(s) Performed: INTRAMEDULLARY (IM) NAIL INTERTROCHANTERIC (Right)  Patient Location: PACU  Anesthesia Type:Spinal  Level of Consciousness: awake, alert  and oriented  Airway & Oxygen Therapy: Patient Spontanous Breathing and Patient connected to face mask oxygen  Post-op Assessment: Report given to RN and Post -op Vital signs reviewed and stable  Post vital signs: Reviewed and stable  Last Vitals:  Vitals Value Taken Time  BP    Temp    Pulse    Resp    SpO2      Last Pain:  Vitals:   09/12/22 2215  TempSrc:   PainSc: 0-No pain         Complications: No notable events documented.

## 2022-09-13 NOTE — Anesthesia Postprocedure Evaluation (Signed)
Anesthesia Post Note  Patient: David Cervantes  Procedure(s) Performed: INTRAMEDULLARY (IM) NAIL INTERTROCHANTERIC (Right)     Patient location during evaluation: PACU Anesthesia Type: Spinal Level of consciousness: awake, awake and alert and oriented Pain management: pain level controlled Vital Signs Assessment: post-procedure vital signs reviewed and stable Respiratory status: spontaneous breathing, nonlabored ventilation and respiratory function stable Cardiovascular status: blood pressure returned to baseline and stable Postop Assessment: no headache, no backache, spinal receding and no apparent nausea or vomiting Anesthetic complications: no   No notable events documented.  Last Vitals:  Vitals:   09/13/22 0400 09/13/22 1432  BP: (!) 142/76 124/75  Pulse: 60 68  Resp: 17 16  Temp: 36.7 C (!) 36.3 C  SpO2: 93%     Last Pain:  Vitals:   09/13/22 1432  TempSrc: Oral  PainSc:                  [name]

## 2022-09-13 NOTE — Op Note (Unsigned)
NAMEKIJUAN, GALLICCHIO MEDICAL RECORD NO: [id number] ACCOUNT NO: [id number] DATE OF BIRTH: 05-13-1927 FACILITY: [facility] LOCATION: [id number] PHYSICIAN: Yetta Barre. Marlou Sa, MD  Operative Report   PREOPERATIVE DIAGNOSIS:  Right hip intertrochanteric fracture.  POSTOPERATIVE DIAGNOSIS:  Right hip intertrochanteric fracture.  PROCEDURE:  Right hip intertrochanteric fracture open reduction and internal fixation with Smith and Nephew nail pin 36 cm.  SURGEON:  Meredith Pel, MD  ASSISTANT:  Annie Main, PA.  INDICATIONS:  The patient is a 88 year old patient with right hip intertrochanteric fracture.  He is an Film/video editor.  He presents for operative management after explanation of risks and benefits.  DESCRIPTION OF PROCEDURE:  The patient was brought to the operating room where spinal anesthetic was induced.  Preoperative antibiotics administered.  Timeout was called.  The patient was placed on the Hana bed with the right leg under some traction,  left leg in lithotomy position with the peroneal nerve well padded.  Area was prescrubbed with alcohol and Betadine, allowed to air dry, prepped with DuraPrep solution and draped in sterile manner.  A wall drape of [place] used to cover the operative field.   After calling timeout, the fracture was reduced, confirmed in the AP and lateral planes.  Incision made 4 fingerbreadths proximal to the tip of the trochanter.  Skin and subcutaneous tissue sharply divided.  Guide pin placed across the fracture site.   In accordance with preoperative templating, proximal reaming was performed and the nail was placed.  All fracture fragments reduced nicely.  Through a separate incision, compression screw and lag screw were placed with excellent compression of the  fracture.  The construct was stable.  Next, the correct placement was confirmed in the AP and lateral planes under fluoroscopy.  Irrigation was performed of both incisions, which were anesthetized using plain Marcaine.   Incisions were then closed using  #1 Vicryl suture followed by interrupted inverted 0 Vicryl suture, 2-0 Vicryl suture, and 3-0 nylon.  Aquacel dressing was placed.  The patient tolerated the procedure well without immediate complications, transferred to the recovery room in stable  condition.  Luke's assistance was required at all times for retraction, opening, closing, mobilization of tissue, nail placement, hardware placement.  His assistance was a medical necessity.       PAA D: 09/13/2022 12:40:15 am T: 09/13/2022 1:36:00 am  JOB: [id number]/ [id number]

## 2022-09-13 NOTE — Progress Notes (Signed)
PROGRESS NOTE    David Cervantes  OIZ:[id number] DOB: [birth year] DOA: 09/12/2022 PCP: [name], MD    Brief Narrative:  88 year old with chronic diastolic heart failure, type 2 diabetes, pulmonary arterial hypertension, hyperlipidemia, dementia from home with daughter presented with right hip pain after fall.  Thought to be mechanical fall.  Recently getting work-up for heart block. Patient was found to have right hip fracture underwent ORIF with IM nail today.   Assessment & Plan:   Closed traumatic right hip fracture: Status post ORIF with IM nail Dr. Marlou Sa 10/2.  Bedrest today.  PT to start mobilizing tomorrow 10/3.  Adequate pain medications. DVT prophylaxis SCDs PT OT, anticipate SNF placement.  Junctional rhythm: Remained stable perioperative.  Asymptomatic.  Followed by cardiology.  EP to follow outpatient.  Continue monitoring while in the hospital.  Chronic diastolic heart failure: Stable.  Euvolemic on clinical exam.  Hold Lasix today.  Type 2 diabetes: Well-controlled with hyperglycemia.  Patient on Lantus and metformin at home.  Will resume.  Essential hypertension: On ramipril.  Resume.   DVT prophylaxis: SCDs Start: 09/13/22 0132 Place TED hose Start: 09/13/22 0132 SCDs Start: 09/12/22 1329   Code Status: Full code Family Communication: Daughter at the bedside Disposition Plan: Status is: Inpatient Remains inpatient appropriate because: Immediate postop.     Consultants:  Orthopedics Cardiology  Procedures:  ORIF and IM nail right hip  Antimicrobials:  Perioperative cefazolin   Subjective: Patient seen and examined.  Daughter at the bedside.  Patient keeps talking and drifts topics around.  Denies any complaints.  No other overnight events.  Denies any pain.  Objective: Vitals:   09/13/22 0100 09/13/22 0115 09/13/22 0213 09/13/22 0400  BP: (!) 150/74 (!) 158/62 (!) 147/97 (!) 142/76  Pulse: (!) 55 (!) 48 (!) 52 60  Resp: 15 13 17 17    Temp:  97.9 F (36.6 C) 97.6 F (36.4 C) 98 F (36.7 C)  TempSrc:   Axillary Axillary  SpO2: 100% 93% 90% 93%    Intake/Output Summary (Last 24 hours) at 09/13/2022 1231 Last data filed at 09/13/2022 0900 Gross per 24 hour  Intake 876.22 ml  Output 50 ml  Net 826.22 ml   There were no vitals filed for this visit.  Examination:  General exam: Appears calm and comfortable  Alert and awake but oriented x1-2.  Talkative.  Bruising around the right eye. Respiratory system: Clear to auscultation. Respiratory effort normal. Cardiovascular system: S1 & S2 heard, RRR.  No pedal edema. Gastrointestinal system: Soft.  Nontender.  Bowel sound present.   Central nervous system: Moves all extremities. Right lateral thigh incision clean and dry.  Distal neurovascular status intact.    Data Reviewed: I have personally reviewed following labs and imaging studies  CBC: Recent Labs  Lab 09/12/22 0932 09/13/22 0450  WBC 9.7 12.2*  NEUTROABS 7.7  --   HGB 10.0* 10.1*  HCT 30.3* 31.1*  MCV 93.8 94.2  PLT 198 338   Basic Metabolic Panel: Recent Labs  Lab 09/12/22 0932 09/12/22 0945 09/13/22 0450  NA 134*  --  134*  K 3.4*  --  4.1  CL 104  --  102  CO2 27  --  23  GLUCOSE 219*  --  274*  BUN 21  --  22  CREATININE 1.09  --  1.10  CALCIUM 8.0*  --  8.1*  MG  --  2.0  --    GFR: Estimated Creatinine Clearance: 34.4 mL/min (by C-G  formula based on SCr of 1.1 mg/dL). Liver Function Tests: Recent Labs  Lab 09/12/22 1259  ALBUMIN 2.5*   No results for input(s): "LIPASE", "AMYLASE" in the last 168 hours. No results for input(s): "AMMONIA" in the last 168 hours. Coagulation Profile: Recent Labs  Lab 09/12/22 0932  INR 1.2   Cardiac Enzymes: No results for input(s): "CKTOTAL", "CKMB", "CKMBINDEX", "TROPONINI" in the last 168 hours. BNP (last 3 results) No results for input(s): "PROBNP" in the last 8760 hours. HbA1C: Recent Labs    09/12/22 1259  HGBA1C 9.8*    CBG: Recent Labs  Lab 09/12/22 2123 09/13/22 0039 09/13/22 0751 09/13/22 0924 09/13/22 1117  GLUCAP 182* 196* 257* 278* 280*   Lipid Profile: No results for input(s): "CHOL", "HDL", "LDLCALC", "TRIG", "CHOLHDL", "LDLDIRECT" in the last 72 hours. Thyroid Function Tests: No results for input(s): "TSH", "T4TOTAL", "FREET4", "T3FREE", "THYROIDAB" in the last 72 hours. Anemia Panel: No results for input(s): "VITAMINB12", "FOLATE", "FERRITIN", "TIBC", "IRON", "RETICCTPCT" in the last 72 hours. Sepsis Labs: No results for input(s): "PROCALCITON", "LATICACIDVEN" in the last 168 hours.  No results found for this or any previous visit (from the past 240 hour(s)).       Radiology Studies: DG FEMUR, MIN 2 VIEWS RIGHT  Result Date: 09/13/2022 CLINICAL DATA:  And operative right intramedullary nail, right intertrochanteric hip fracture. EXAM: RIGHT FEMUR 2 VIEWS COMPARISON:  09/12/2022. FINDINGS: Five fluoroscopic images were obtained intraoperatively for placement of fixation hardware for intertrochanteric right femoral fracture. Total fluoroscopy time is 36 seconds. Dose: 5.5 mGy. Please see operative report for additional information. IMPRESSION: Intraoperative utilization of fluoroscopy. Electronically Signed   By: Brett Fairy M.D.   On: 09/13/2022 00:25   DG C-Arm 1-60 Min-No Report  Result Date: 09/13/2022 Fluoroscopy was utilized by the requesting physician.  No radiographic interpretation.   DG Hip Unilat  With Pelvis 2-3 Views Right  Result Date: 09/12/2022 CLINICAL DATA:  Mechanical fall with right hip pain EXAM: DG HIP (WITH OR WITHOUT PELVIS) 2-3V RIGHT COMPARISON:  None Available. FINDINGS: Acute intertrochanteric right femur fracture with varus angulation. Generalized osteopenia. Rectal stool distention. IMPRESSION: 1. Intertrochanteric right femur fracture. 2. Stool distended rectum. Electronically Signed   By: Jorje Guild M.D.   On: 09/12/2022 09:43   DG Chest 1  View  Result Date: 09/12/2022 CLINICAL DATA:  Hip fracture.  Mechanical fall EXAM: CHEST  1 VIEW COMPARISON:  05/20/2022 FINDINGS: Stable heart size and mediastinal contours. Indistinct density behind the heart likely with pleural fluid. No pneumothorax. Benign chondroid matrix in the left humeral shaft. IMPRESSION: Low volume chest with atelectasis or infiltrate at the left base. Electronically Signed   By: Jorje Guild M.D.   On: 09/12/2022 09:28        Scheduled Meds:  acetaminophen  500 mg Oral Q6H   aspirin  81 mg Oral BID   B-complex with vitamin C  1 tablet Oral Daily   docusate sodium  100 mg Oral BID   feeding supplement (GLUCERNA SHAKE)  237 mL Oral TID BM   insulin aspart  0-15 Units Subcutaneous TID WC   insulin aspart  0-5 Units Subcutaneous QHS   Continuous Infusions:  sodium chloride 75 mL/hr at 09/13/22 0156    ceFAZolin (ANCEF) IV 2 g (09/13/22 0934)   epinephrine     methocarbamol (ROBAXIN) IV       LOS: 1 day    Time spent: 35 minutes    Barb Merino, MD [facility] Pager [phone]

## 2022-09-13 NOTE — Inpatient Diabetes Management (Signed)
Inpatient Diabetes Program Recommendations  AACE/ADA: New Consensus Statement on Inpatient Glycemic Control (2015)  Target Ranges:  Prepandial:   less than 140 mg/dL      Peak postprandial:   less than 180 mg/dL (1-2 hours)      Critically ill patients:  140 - 180 mg/dL   Lab Results  Component Value Date   GLUCAP 280 (H) 09/13/2022   HGBA1C 9.8 (H) 09/12/2022    Review of Glycemic Control  Latest Reference Range & Units 09/13/22 07:51 09/13/22 09:24 09/13/22 11:17  Glucose-Capillary 70 - 99 mg/dL 257 (H) 278 (H) 280 (H)  (H): Data is abnormally high Diabetes history: Type 2 DM Outpatient Diabetes medications: Lantus 8-9 units QHS, Metformin 500 mg QD Current orders for Inpatient glycemic control: Novolog 0-15 units TID & HS  Inpatient Diabetes Program Recommendations:    Consider adding Semglee 8 units QHS.  Thanks, Bronson Curb, MSN, RNC-OB Diabetes Coordinator [phone] (8a-5p)

## 2022-09-13 NOTE — Brief Op Note (Signed)
   09/12/2022 - 09/13/2022  12:36 AM  PATIENT:  David Cervantes  88 y.o. male  PRE-OPERATIVE DIAGNOSIS:  RIGHT HIP INTERTROCHANTERIC HIP FRACTURE  POST-OPERATIVE DIAGNOSIS:  right hip intertrochanteric fracture  PROCEDURE:  Procedure(s): INTRAMEDULLARY (IM) NAIL INTERTROCHANTERIC  SURGEON:  Surgeon(s): Meredith Pel, MD  ASSISTANT: magnant pa  ANESTHESIA:   spinal  EBL: 25 ml    Total I/O In: 100 [IV Piggyback:100] Out: 50 [Blood:50]  BLOOD ADMINISTERED: none  DRAINS: none   LOCAL MEDICATIONS USED:  marcaine   SPECIMEN:  No Specimen  COUNTS:  YES  TOURNIQUET:  * No tourniquets in log *  DICTATION: .Other Dictation: Dictation Number [id number]  PLAN OF CARE: Admit to inpatient   PATIENT DISPOSITION:  PACU - hemodynamically stable

## 2022-09-13 NOTE — Progress Notes (Signed)
Initial Nutrition Assessment  DOCUMENTATION CODES:   Severe malnutrition in context of chronic illness  INTERVENTION:   -Glucerna Shake po TID, each supplement provides 220 kcal and 10 grams of protein   -B complex w/ Vitamin C daily  NUTRITION DIAGNOSIS:   Severe Malnutrition related to chronic illness (dementia) as evidenced by moderate fat depletion, severe muscle depletion, percent weight loss.  GOAL:   Patient will meet greater than or equal to 90% of their needs  MONITOR:   PO intake, Supplement acceptance, Weight trends, Labs, I & O's  REASON FOR ASSESSMENT:   Consult Hip fracture protocol  ASSESSMENT:   88 year old with chronic diastolic heart failure, type 2 diabetes, pulmonary arterial hypertension, hyperlipidemia, dementia from home with daughter presented with right hip pain after fall.  10/2: s/p ORIF with IM nail   Pt in room, daughter at bedside. Pt currently with  Pt ate 25% of breakfast. Reports he doesn't have a good appetite right now. Daughter reports he typically has a great appetite and eats "anything and everything". Denies any issues with chewing but does get choked up sometimes while eating.  Pt drinks Glucerna "sometimes" per daughter. Pt was advised by his doctor to drink 2-3 daily. Pt takes the following supplements: B-12, super B complex, Vitamin C, D, Zinc  Per weight records, pt has lost 19 lbs since 6/8 (13% wt loss x 4 months, significant for time frame).   Medications reviewed.  Labs reviewed: CBGs: 196-280 Low Na   NUTRITION - FOCUSED PHYSICAL EXAM:  Flowsheet Row Most Recent Value  Orbital Region Moderate depletion  Upper Arm Region Severe depletion  Thoracic and Lumbar Region Moderate depletion  Buccal Region Moderate depletion  Temple Region Severe depletion  Clavicle Bone Region Severe depletion  Clavicle and Acromion Bone Region Severe depletion  Scapular Bone Region Moderate depletion  Dorsal Hand Unable to assess   [mitts]  Patellar Region Unable to assess  [didn't want to move legs]  Anterior Thigh Region Unable to assess  Posterior Calf Region Unable to assess  Edema (RD Assessment) None  Hair Reviewed  Eyes Reviewed  Mouth Reviewed  Skin Reviewed  [bruising over right eye]  Nails Unable to assess       Diet Order:   Diet Order             Diet regular Room service appropriate? Yes; Fluid consistency: Thin  Diet effective now                   EDUCATION NEEDS:   No education needs have been identified at this time  Skin:  Skin Assessment: Skin Integrity Issues: Skin Integrity Issues:: Incisions Incisions: 10/2: right hip  Last BM:  10/2  Height:   Ht Readings from Last 1 Encounters:  09/06/22 5\' 8"  (1.727 m)    Weight:   Wt Readings from Last 1 Encounters:  09/06/22 56.7 kg    BMI:  18.9 kg/m^2  Estimated Nutritional Needs:   Kcal:  1500-1700  Protein:  70-85g  Fluid:  1.7L/day  Clayton Bibles, MS, RD, LDN Inpatient Clinical Dietitian Contact information available via Amion

## 2022-09-13 NOTE — Progress Notes (Addendum)
Rounding Note    Patient Name: David Cervantes Date of Encounter: 09/13/2022  [place] Cardiologist: Donato Heinz, MD    Subjective   88 year old gentleman with a history of paroxysmal atrial fibrillation, chronic diastolic congestive heart failure, diabetes mellitus, hyperlipidemia, dementia. He fell and had a hip fracture. Dr. Debara Pickett saw him for preoperative evaluation. He had hip surgery this morning and is doing quite well.  He is up eating breakfast with the assistance of his daughter.  Current heart rate is 79. Does not appear to be in any pain. He has intermittent heart block with junctional escape.  Inpatient Medications    Scheduled Meds:  acetaminophen  500 mg Oral Q6H   aspirin  81 mg Oral BID   docusate sodium  100 mg Oral BID   insulin aspart  0-15 Units Subcutaneous TID WC   insulin aspart  0-5 Units Subcutaneous QHS   Continuous Infusions:  sodium chloride 75 mL/hr at 09/13/22 0156    ceFAZolin (ANCEF) IV     epinephrine     methocarbamol (ROBAXIN) IV     PRN Meds: [START ON 09/14/2022] acetaminophen, HYDROcodone-acetaminophen, menthol-cetylpyridinium **OR** phenol, methocarbamol **OR** methocarbamol (ROBAXIN) IV, metoCLOPramide **OR** metoCLOPramide (REGLAN) injection, morphine injection, ondansetron **OR** ondansetron (ZOFRAN) IV   Vital Signs    Vitals:   09/13/22 0100 09/13/22 0115 09/13/22 0213 09/13/22 0400  BP: (!) 150/74 (!) 158/62 (!) 147/97 (!) 142/76  Pulse: (!) 55 (!) 48 (!) 52 60  Resp: 15 13 17 17   Temp:  97.9 F (36.6 C) 97.6 F (36.4 C) 98 F (36.7 C)  TempSrc:   Axillary Axillary  SpO2: 100% 93% 90% 93%    Intake/Output Summary (Last 24 hours) at 09/13/2022 0826 Last data filed at 09/13/2022 0043 Gross per 24 hour  Intake 826.22 ml  Output 50 ml  Net 776.22 ml      09/06/2022    4:20 PM 07/15/2022    3:59 PM 07/05/2022    3:30 PM  Last 3 Weights  Weight (lbs) 125 lb 123 lb 6.4 oz 128 lb 9.6 oz   Weight (kg) 56.7 kg 55.974 kg 58.333 kg      Telemetry    Complete heart block with Junctional rhythm at 79 - Personally Reviewed  ECG     - Personally Reviewed  Physical Exam   GEN: No acute distress.   Neck: No JVD Cardiac: RRR, no murmurs, rubs, or gallops.  Respiratory: Clear to auscultation bilaterally. GI: Soft, nontender, non-distended  MS: No edema; No deformity. Neuro:  Nonfocal  Psych: Normal affect   Labs    High Sensitivity Troponin:  No results for input(s): "TROPONINIHS" in the last 720 hours.   Chemistry Recent Labs  Lab 09/12/22 0932 09/12/22 0945 09/12/22 1259 09/13/22 0450  NA 134*  --   --  134*  K 3.4*  --   --  4.1  CL 104  --   --  102  CO2 27  --   --  23  GLUCOSE 219*  --   --  274*  BUN 21  --   --  22  CREATININE 1.09  --   --  1.10  CALCIUM 8.0*  --   --  8.1*  MG  --  2.0  --   --   ALBUMIN  --   --  2.5*  --   GFRNONAA >60  --   --  >60  ANIONGAP 3*  --   --  9    Lipids No results for input(s): "CHOL", "TRIG", "HDL", "LABVLDL", "LDLCALC", "CHOLHDL" in the last 168 hours.  Hematology Recent Labs  Lab 09/12/22 0932 09/13/22 0450  WBC 9.7 12.2*  RBC 3.23* 3.30*  HGB 10.0* 10.1*  HCT 30.3* 31.1*  MCV 93.8 94.2  MCH 31.0 30.6  MCHC 33.0 32.5  RDW 13.2 13.2  PLT 198 230   Thyroid No results for input(s): "TSH", "FREET4" in the last 168 hours.  BNPNo results for input(s): "BNP", "PROBNP" in the last 168 hours.  DDimer No results for input(s): "DDIMER" in the last 168 hours.   Radiology    DG FEMUR, MIN 2 VIEWS RIGHT  Result Date: 09/13/2022 CLINICAL DATA:  And operative right intramedullary nail, right intertrochanteric hip fracture. EXAM: RIGHT FEMUR 2 VIEWS COMPARISON:  09/12/2022. FINDINGS: Five fluoroscopic images were obtained intraoperatively for placement of fixation hardware for intertrochanteric right femoral fracture. Total fluoroscopy time is 36 seconds. Dose: 5.5 mGy. Please see operative report for additional  information. IMPRESSION: Intraoperative utilization of fluoroscopy. Electronically Signed   By: Brett Fairy M.D.   On: 09/13/2022 00:25   DG C-Arm 1-60 Min-No Report  Result Date: 09/13/2022 Fluoroscopy was utilized by the requesting physician.  No radiographic interpretation.   DG Hip Unilat  With Pelvis 2-3 Views Right  Result Date: 09/12/2022 CLINICAL DATA:  Mechanical fall with right hip pain EXAM: DG HIP (WITH OR WITHOUT PELVIS) 2-3V RIGHT COMPARISON:  None Available. FINDINGS: Acute intertrochanteric right femur fracture with varus angulation. Generalized osteopenia. Rectal stool distention. IMPRESSION: 1. Intertrochanteric right femur fracture. 2. Stool distended rectum. Electronically Signed   By: Jorje Guild M.D.   On: 09/12/2022 09:43   DG Chest 1 View  Result Date: 09/12/2022 CLINICAL DATA:  Hip fracture.  Mechanical fall EXAM: CHEST  1 VIEW COMPARISON:  05/20/2022 FINDINGS: Stable heart size and mediastinal contours. Indistinct density behind the heart likely with pleural fluid. No pneumothorax. Benign chondroid matrix in the left humeral shaft. IMPRESSION: Low volume chest with atelectasis or infiltrate at the left base. Electronically Signed   By: Jorje Guild M.D.   On: 09/12/2022 09:28    Cardiac Studies      Patient Profile     88 y.o. male    Assessment & Plan     Junctional rhythm:   completely asymptomatic.   He has an appt with EP this week.   This will likely need to be rescheduled if he is still in the hospital or SNF .   HR is currently normal . No need for urgent pacer at this point . Will continue to follow  while he is here   2.  Chronic diastolic CHF Stable   3. Dementia:   stable          For questions or updates, please contact [place] Please consult [email or web address] for contact info under        Signed, Mertie Moores, MD  09/13/2022, 8:26 AM

## 2022-09-14 DIAGNOSIS — I442 Atrioventricular block, complete: Secondary | ICD-10-CM | POA: Diagnosis not present

## 2022-09-14 DIAGNOSIS — I5032 Chronic diastolic (congestive) heart failure: Secondary | ICD-10-CM | POA: Diagnosis not present

## 2022-09-14 DIAGNOSIS — S72001A Fracture of unspecified part of neck of right femur, initial encounter for closed fracture: Secondary | ICD-10-CM | POA: Diagnosis not present

## 2022-09-14 LAB — GLUCOSE, CAPILLARY
Glucose-Capillary: 147 mg/dL — ABNORMAL HIGH (ref 70–99)
Glucose-Capillary: 63 mg/dL — ABNORMAL LOW (ref 70–99)
Glucose-Capillary: 241 mg/dL — ABNORMAL HIGH (ref 70–99)
Glucose-Capillary: 176 mg/dL — ABNORMAL HIGH (ref 70–99)
Glucose-Capillary: 114 mg/dL — ABNORMAL HIGH (ref 70–99)

## 2022-09-14 MED ORDER — INSULIN GLARGINE-YFGN 100 UNIT/ML ~~LOC~~ SOLN
5.0000 [IU] | Freq: Every day | SUBCUTANEOUS | Status: DC
  Administered 2022-09-14 – 2022-09-19 (×6): 5 [IU] via SUBCUTANEOUS
  Filled 2022-09-14 (×7): qty 0.05

## 2022-09-14 MED ORDER — LORAZEPAM 0.5 MG PO TABS
0.5000 mg | ORAL_TABLET | ORAL | Status: DC | PRN
  Administered 2022-09-16: 0.5 mg via ORAL
  Filled 2022-09-14: qty 1

## 2022-09-14 NOTE — Progress Notes (Signed)
Rounding Note    Patient Name: David Cervantes Date of Encounter: 09/14/2022  [place] Cardiologist: Donato Heinz, MD    Subjective   88 year old gentleman with a history of paroxysmal atrial fibrillation, chronic diastolic congestive heart failure, diabetes mellitus, hyperlipidemia, dementia. He fell and had a hip fracture. Dr. Debara Pickett saw him for preoperative evaluation. He had hip surgery yesterday and is doing well Was able to read the clock on the wall and gave the correct time .    Current heart rate is  55-60  Does not appear to be in any pain. He has intermittent heart block with junctional escape.  Inpatient Medications    Scheduled Meds:  ascorbic acid  500 mg Oral Daily   aspirin  81 mg Oral BID   B-complex with vitamin C  1 tablet Oral Daily   cholecalciferol  1,000 Units Oral Once per day on Mon Wed Fri   docusate sodium  100 mg Oral BID   feeding supplement (GLUCERNA SHAKE)  237 mL Oral TID BM   furosemide  10 mg Oral q AM   insulin aspart  0-15 Units Subcutaneous TID WC   insulin aspart  0-5 Units Subcutaneous QHS   insulin glargine-yfgn  5 Units Subcutaneous QHS   magnesium oxide  200 mg Oral Daily   metFORMIN  500 mg Oral Q breakfast   potassium chloride  5 mEq Oral Daily   ramipril  2.5 mg Oral Daily   zinc sulfate  220 mg Oral Daily   Continuous Infusions:  epinephrine     methocarbamol (ROBAXIN) IV     PRN Meds: acetaminophen, HYDROcodone-acetaminophen, menthol-cetylpyridinium **OR** phenol, methocarbamol **OR** methocarbamol (ROBAXIN) IV, metoCLOPramide **OR** metoCLOPramide (REGLAN) injection, morphine injection, ondansetron **OR** ondansetron (ZOFRAN) IV   Vital Signs    Vitals:   09/13/22 0400 09/13/22 1432 09/13/22 2045 09/14/22 0543  BP: (!) 142/76 124/75 (!) 123/109 (!) 147/68  Pulse: 60 68 80 71  Resp: 17 16 20    Temp: 98 F (36.7 C) (!) 97.4 F (36.3 C) (!) 97.4 F (36.3 C) 98.5 F (36.9 C)  TempSrc:  Axillary Oral Oral   SpO2: 93%  97% 100%    Intake/Output Summary (Last 24 hours) at 09/14/2022 0816 Last data filed at 09/13/2022 2100 Gross per 24 hour  Intake 250 ml  Output 550 ml  Net -300 ml       09/06/2022    4:20 PM 07/15/2022    3:59 PM 07/05/2022    3:30 PM  Last 3 Weights  Weight (lbs) 125 lb 123 lb 6.4 oz 128 lb 9.6 oz  Weight (kg) 56.7 kg 55.974 kg 58.333 kg      Telemetry    Complete heart block with Junctional rhythm at 79 - Personally Reviewed  ECG     - Personally Reviewed  Physical Exam   Physical Exam: Blood pressure (!) 147/68, pulse 71, temperature 98.5 F (36.9 C), resp. rate 20, SpO2 100 %.       GEN:  elderly male, lying in bed.   HEENT: Normal NECK: No JVD; No carotid bruits LYMPHATICS: No lymphadenopathy CARDIAC: irreg.  [id number] systolic murmur  RESPIRATORY:  Clear to auscultation without rales, wheezing or rhonchi  ABDOMEN: Soft, non-tender, non-distended MUSCULOSKELETAL:  No edema; No deformity  SKIN: Warm and dry NEUROLOGIC:  Alert and oriented x 3   Labs    High Sensitivity Troponin:  No results for input(s): "TROPONINIHS" in the last 720 hours.  Chemistry Recent Labs  Lab 09/12/22 0932 09/12/22 0945 09/12/22 1259 09/13/22 0450  NA 134*  --   --  134*  K 3.4*  --   --  4.1  CL 104  --   --  102  CO2 27  --   --  23  GLUCOSE 219*  --   --  274*  BUN 21  --   --  22  CREATININE 1.09  --   --  1.10  CALCIUM 8.0*  --   --  8.1*  MG  --  2.0  --   --   ALBUMIN  --   --  2.5*  --   GFRNONAA >60  --   --  >60  ANIONGAP 3*  --   --  9     Lipids No results for input(s): "CHOL", "TRIG", "HDL", "LABVLDL", "LDLCALC", "CHOLHDL" in the last 168 hours.  Hematology Recent Labs  Lab 09/12/22 0932 09/13/22 0450  WBC 9.7 12.2*  RBC 3.23* 3.30*  HGB 10.0* 10.1*  HCT 30.3* 31.1*  MCV 93.8 94.2  MCH 31.0 30.6  MCHC 33.0 32.5  RDW 13.2 13.2  PLT 198 230    Thyroid No results for input(s): "TSH", "FREET4" in the last 168  hours.  BNPNo results for input(s): "BNP", "PROBNP" in the last 168 hours.  DDimer No results for input(s): "DDIMER" in the last 168 hours.   Radiology    DG FEMUR, MIN 2 VIEWS RIGHT  Result Date: 09/13/2022 CLINICAL DATA:  And operative right intramedullary nail, right intertrochanteric hip fracture. EXAM: RIGHT FEMUR 2 VIEWS COMPARISON:  09/12/2022. FINDINGS: Five fluoroscopic images were obtained intraoperatively for placement of fixation hardware for intertrochanteric right femoral fracture. Total fluoroscopy time is 36 seconds. Dose: 5.5 mGy. Please see operative report for additional information. IMPRESSION: Intraoperative utilization of fluoroscopy. Electronically Signed   By: Brett Fairy M.D.   On: 09/13/2022 00:25   DG C-Arm 1-60 Min-No Report  Result Date: 09/13/2022 Fluoroscopy was utilized by the requesting physician.  No radiographic interpretation.   DG Hip Unilat  With Pelvis 2-3 Views Right  Result Date: 09/12/2022 CLINICAL DATA:  Mechanical fall with right hip pain EXAM: DG HIP (WITH OR WITHOUT PELVIS) 2-3V RIGHT COMPARISON:  None Available. FINDINGS: Acute intertrochanteric right femur fracture with varus angulation. Generalized osteopenia. Rectal stool distention. IMPRESSION: 1. Intertrochanteric right femur fracture. 2. Stool distended rectum. Electronically Signed   By: Jorje Guild M.D.   On: 09/12/2022 09:43   DG Chest 1 View  Result Date: 09/12/2022 CLINICAL DATA:  Hip fracture.  Mechanical fall EXAM: CHEST  1 VIEW COMPARISON:  05/20/2022 FINDINGS: Stable heart size and mediastinal contours. Indistinct density behind the heart likely with pleural fluid. No pneumothorax. Benign chondroid matrix in the left humeral shaft. IMPRESSION: Low volume chest with atelectasis or infiltrate at the left base. Electronically Signed   By: Jorje Guild M.D.   On: 09/12/2022 09:28    Cardiac Studies      Patient Profile     88 y.o. male    Assessment & Plan      Junctional rhythm:    has high degree AV block .  Is consistent with complete heart block with junctional escape.   Has occas PVCs. He is very stable at this time. Encouraged his daughter to reschedule his appt with EP for 1-2 weeks   2.  Chronic diastolic CHF Stable   3. Dementia:   stable  [facility] will sign off.   Medication Recommendations:   Other recommendations (labs, testing, etc):   Follow up as an outpatient:  with Dr. Gardiner Rhyme    For questions or updates, please contact [place] Please consult [email or web address] for contact info under        Signed, Mertie Moores, MD  09/14/2022, 8:16 AM

## 2022-09-14 NOTE — Inpatient Diabetes Management (Signed)
Inpatient Diabetes Program Recommendations  AACE/ADA: New Consensus Statement on Inpatient Glycemic Control (2015)  Target Ranges:  Prepandial:   less than 140 mg/dL      Peak postprandial:   less than 180 mg/dL (1-2 hours)      Critically ill patients:  140 - 180 mg/dL   Lab Results  Component Value Date   GLUCAP 63 (L) 09/14/2022   HGBA1C 9.8 (H) 09/12/2022    Review of Glycemic Control  Latest Reference Range & Units 09/13/22 07:51 09/13/22 09:24 09/13/22 11:17 09/13/22 17:37 09/13/22 20:52 09/14/22 07:38  Glucose-Capillary 70 - 99 mg/dL 257 (H) 278 (H) 280 (H) 172 (H) 147 (H) 63 (L)  (H): Data is abnormally high (L): Data is abnormally low  Diabetes history: Type 2 DM Outpatient Diabetes medications: Lantus 8-9 units QHS, Metformin 500 mg QD Current orders for Inpatient glycemic control: Semglee 5 units QHS, Novolog 0-15 units TID & HS, Metformin 500 mg QAM   Inpatient Diabetes Program Recommendations:    Semglee 4 units QHS Novolog 0-9 units TID  Will continue to follow while inpatient.  Thank you, Reche Dixon, MSN, [place] [facility] Coordinator Inpatient Diabetes Program [phone] (team pager from 8a-5p)

## 2022-09-14 NOTE — Progress Notes (Signed)
PROGRESS NOTE    David Cervantes  PSU:[id number] DOB: Mar 08, 1927 DOA: 09/12/2022 PCP: Georgann Housekeeper, MD    Brief Narrative:  88 year old with chronic diastolic heart failure, type 2 diabetes, pulmonary arterial hypertension, hyperlipidemia, dementia from home with daughter presented with right hip pain after fall.  Thought to be mechanical fall.  Recently getting work-up for heart block. Patient was found to have right hip fracture underwent ORIF with IM nail. Developed appropriate postop confusion.   Assessment & Plan:   Closed traumatic right hip fracture: Status post ORIF with IM nail Dr. August Saucer 10/2.  Started to work with PT today.  Adequate pain medications. DVT prophylaxis aspirin 81 mg p.o. twice daily. Nonweightbearing right lower extremity. PT OT, anticipate SNF placement.  Junctional rhythm: Remained stable perioperative.  Asymptomatic.  Followed by cardiology.  EP to follow outpatient.  Continue monitoring while in the hospital.  Chronic diastolic heart failure: Stable.  Euvolemic on clinical exam.  Hold Lasix today.  Type 2 diabetes: Well-controlled with hyperglycemia.  Encourage oral intake today.  Decrease dose of Lantus 5 units nightly.  Continue metformin.  Continue sliding scale insulin.   Essential hypertension: On ramipril.  Resumed.  Postop delirium in a patient with dementia: All-time fall precautions.  Delirium precautions.  Unable to use antipsychotics due to prolonged junctional rhythms.  We will use low-dose Ativan as needed.   DVT prophylaxis: SCDs Start: 09/13/22 0132 Place TED hose Start: 09/13/22 0132 SCDs Start: 09/12/22 1329   Code Status: Full code Family Communication: Daughter at the bedside Disposition Plan: Status is: Inpatient Remains inpatient appropriate because: Immediate postop.     Consultants:  Orthopedics Cardiology  Procedures:  ORIF and IM nail right hip  Antimicrobials:  Perioperative  cefazolin   Subjective: Patient seen and examined.  Overnight had some episodes of confusion and is attempting to get out of the bed.  Looks slightly impulsive today.  Answers appropriately.  Denies any pain.  Objective: Vitals:   09/13/22 0400 09/13/22 1432 09/13/22 2045 09/14/22 0543  BP: (!) 142/76 124/75 (!) 123/109 (!) 147/68  Pulse: 60 68 80 71  Resp: 17 16 20    Temp: 98 F (36.7 C) (!) 97.4 F (36.3 C) (!) 97.4 F (36.3 C) 98.5 F (36.9 C)  TempSrc: Axillary Oral Oral   SpO2: 93%  97% 100%    Intake/Output Summary (Last 24 hours) at 09/14/2022 1419 Last data filed at 09/14/2022 0845 Gross per 24 hour  Intake 200 ml  Output 800 ml  Net -600 ml    There were no vitals filed for this visit.  Examination:  General exam: Appears fidgety and impulsive. Alert and awake but oriented x1-2.  Talkative.  Bruising around the right eye. Respiratory system: Clear to auscultation. Respiratory effort normal. Cardiovascular system: S1 & S2 heard, RRR.  No pedal edema. Gastrointestinal system: Soft.  Nontender.  Bowel sound present.   Central nervous system: Moves all extremities. Right lateral thigh incision clean and dry.  Distal neurovascular status intact.    Data Reviewed: I have personally reviewed following labs and imaging studies  CBC: Recent Labs  Lab 09/12/22 0932 09/13/22 0450  WBC 9.7 12.2*  NEUTROABS 7.7  --   HGB 10.0* 10.1*  HCT 30.3* 31.1*  MCV 93.8 94.2  PLT 198 230    Basic Metabolic Panel: Recent Labs  Lab 09/12/22 0932 09/12/22 0945 09/13/22 0450  NA 134*  --  134*  K 3.4*  --  4.1  CL 104  --  102  CO2 27  --  23  GLUCOSE 219*  --  274*  BUN 21  --  22  CREATININE 1.09  --  1.10  CALCIUM 8.0*  --  8.1*  MG  --  2.0  --     GFR: Estimated Creatinine Clearance: 34.4 mL/min (by C-G formula based on SCr of 1.1 mg/dL). Liver Function Tests: Recent Labs  Lab 09/12/22 1259  ALBUMIN 2.5*    No results for input(s): "LIPASE",  "AMYLASE" in the last 168 hours. No results for input(s): "AMMONIA" in the last 168 hours. Coagulation Profile: Recent Labs  Lab 09/12/22 0932  INR 1.2    Cardiac Enzymes: No results for input(s): "CKTOTAL", "CKMB", "CKMBINDEX", "TROPONINI" in the last 168 hours. BNP (last 3 results) No results for input(s): "PROBNP" in the last 8760 hours. HbA1C: Recent Labs    09/12/22 1259  HGBA1C 9.8*    CBG: Recent Labs  Lab 09/13/22 1117 09/13/22 1737 09/13/22 2052 09/14/22 0738 09/14/22 1203  GLUCAP 280* 172* 147* 63* 241*    Lipid Profile: No results for input(s): "CHOL", "HDL", "LDLCALC", "TRIG", "CHOLHDL", "LDLDIRECT" in the last 72 hours. Thyroid Function Tests: No results for input(s): "TSH", "T4TOTAL", "FREET4", "T3FREE", "THYROIDAB" in the last 72 hours. Anemia Panel: No results for input(s): "VITAMINB12", "FOLATE", "FERRITIN", "TIBC", "IRON", "RETICCTPCT" in the last 72 hours. Sepsis Labs: No results for input(s): "PROCALCITON", "LATICACIDVEN" in the last 168 hours.  No results found for this or any previous visit (from the past 240 hour(s)).       Radiology Studies: DG FEMUR, MIN 2 VIEWS RIGHT  Result Date: 09/13/2022 CLINICAL DATA:  And operative right intramedullary nail, right intertrochanteric hip fracture. EXAM: RIGHT FEMUR 2 VIEWS COMPARISON:  09/12/2022. FINDINGS: Five fluoroscopic images were obtained intraoperatively for placement of fixation hardware for intertrochanteric right femoral fracture. Total fluoroscopy time is 36 seconds. Dose: 5.5 mGy. Please see operative report for additional information. IMPRESSION: Intraoperative utilization of fluoroscopy. Electronically Signed   By: Brett Fairy M.D.   On: 09/13/2022 00:25   DG C-Arm 1-60 Min-No Report  Result Date: 09/13/2022 Fluoroscopy was utilized by the requesting physician.  No radiographic interpretation.        Scheduled Meds:  ascorbic acid  500 mg Oral Daily   aspirin  81 mg Oral BID    B-complex with vitamin C  1 tablet Oral Daily   cholecalciferol  1,000 Units Oral Once per day on Mon Wed Fri   docusate sodium  100 mg Oral BID   feeding supplement (GLUCERNA SHAKE)  237 mL Oral TID BM   furosemide  10 mg Oral q AM   insulin aspart  0-15 Units Subcutaneous TID WC   insulin aspart  0-5 Units Subcutaneous QHS   insulin glargine-yfgn  5 Units Subcutaneous QHS   magnesium oxide  200 mg Oral Daily   metFORMIN  500 mg Oral Q breakfast   potassium chloride  5 mEq Oral Daily   ramipril  2.5 mg Oral Daily   zinc sulfate  220 mg Oral Daily   Continuous Infusions:  epinephrine     methocarbamol (ROBAXIN) IV       LOS: 2 days    Time spent: 35 minutes    Barb Merino, MD [facility] Pager [phone]

## 2022-09-14 NOTE — Progress Notes (Signed)
  Subjective: David Cervantes is a 88 y.o. male s/p right Hip IM nail.  They are POD2.  Pt's pain is controlled. Doing well. Daughter is in the room assisting with history. Occasionally has episodes where he feels like he is falling but otherwise pain is controlled. No ambulation yet. .    Objective: Vital signs in last 24 hours: Temp:  [97.4 F (36.3 C)-99.2 F (37.3 C)] 99.2 F (37.3 C) (10/03 1509) Pulse Rate:  [52-80] 52 (10/03 1509) Resp:  [20-22] 22 (10/03 1509) BP: (123-179)/(56-109) 179/56 (10/03 1509) SpO2:  [97 %-100 %] 97 % (10/03 1509)  Intake/Output from previous day: 10/02 0701 - 10/03 0700 In: 250 [P.O.:50; IV Piggyback:200] Out: 550 [Urine:550] Intake/Output this shift: Total I/O In: -  Out: 250 [Urine:250]  Exam:  No gross blood or drainage overlying the dressings 2+ DP pulse Sensation intact distally in the right foot Able to dorsiflex and plantarflex the right foot   Labs: Recent Labs    09/12/22 0932 09/13/22 0450  HGB 10.0* 10.1*   Recent Labs    09/12/22 0932 09/13/22 0450  WBC 9.7 12.2*  RBC 3.23* 3.30*  HCT 30.3* 31.1*  PLT 198 230   Recent Labs    09/12/22 0932 09/13/22 0450  NA 134* 134*  K 3.4* 4.1  CL 104 102  CO2 27 23  BUN 21 22  CREATININE 1.09 1.10  GLUCOSE 219* 274*  CALCIUM 8.0* 8.1*   Recent Labs    09/12/22 0932  INR 1.2    Assessment/Plan: Pt is POD2 s/p right hip IM nail.    -Disposition pending medical team clearance and decision  -NWB RLE; okay for TDWB for transfers only  -DVT Prophylaxis: ASA 81mg  BID     [facility] 09/14/2022, 3:57 PM

## 2022-09-14 NOTE — Evaluation (Signed)
Physical Therapy Evaluation Patient Details Name: David Cervantes MRN: [id number] DOB: 06-20-1927 Today's Date: 09/14/2022  History of Present Illness  88 y.o. male with chronic diastolic heart failure, type 2 diabetes, pulmonary arterial hypertension, hyperlipidemia, dementia from home with daughter presented with right hip pain after fall. Patient was found to have right hip fracture underwent ORIF with IM nail on 09/13/22.    Clinical Impression  David Cervantes is a 88 y.o. male POD 1 s/p Rt hip ORIF due to hip fracture. Patient disoriented and unable to provide PLOF however pt's daughter reports pt was independent with no device PTA. Patient is now limited by functional impairments (see PT problem list below) and requires Total Assist for bed mobility due to pain and mentation. Patient will benefit from continued skilled PT interventions to address impairments and progress towards PLOF. Acute PT will follow to progress mobility as able, recommend SNF rehab.        Recommendations for follow up therapy are one component of a multi-disciplinary discharge planning process, led by the attending physician.  Recommendations may be updated based on patient status, additional functional criteria and insurance authorization.  Follow Up Recommendations Skilled nursing-short term rehab (<3 hours/day) Can patient physically be transported by private vehicle: No    Assistance Recommended at Discharge Frequent or constant Supervision/Assistance  Patient can return home with the following  Two people to help with walking and/or transfers;Two people to help with bathing/dressing/bathroom;Assistance with cooking/housework;Assistance with feeding;Direct supervision/assist for financial management;Direct supervision/assist for medications management;Assist for transportation;Help with stairs or ramp for entrance    Equipment Recommendations None recommended by PT  Recommendations for Other  Services       Functional Status Assessment Patient has had a recent decline in their functional status and demonstrates the ability to make significant improvements in function in a reasonable and predictable amount of time.     Precautions / Restrictions Precautions Precautions: Fall Restrictions Weight Bearing Restrictions: Yes RLE Weight Bearing: Non weight bearing Other Position/Activity Restrictions: TDWB for transfer only with RW      Mobility  Bed Mobility Overal bed mobility: Needs Assistance Bed Mobility: Supine to Sit, Sit to Supine     Supine to sit: Total assist, +2 for safety/equipment, +2 for physical assistance, HOB elevated Sit to supine: Total assist, +2 for physical assistance, +2 for safety/equipment   General bed mobility comments: pt repsonding verbally but disoriented and keeping eyes closed until sitting EOB. pt required Total Assist to pivot with bed pad to sit up EOB. Pt restless and expressed pain in sitting. Total assist to return to supine and boost in bed. pt resting with head in Lt lateral flexion and slight forward flexion. towel roll and pillows utilized to optimize positioning in bed.    Transfers                   General transfer comment: unable to progress due to pain and mentation    Ambulation/Gait                  Stairs            Wheelchair Mobility    Modified Rankin (Stroke Patients Only)       Balance  Pertinent Vitals/Pain Pain Assessment Pain Assessment: PAINAD Faces Pain Scale: Hurts whole lot Breathing: normal Negative Vocalization: occasional moan/groan, low speech, negative/disapproving quality Facial Expression: sad, frightened, frown Body Language: tense, distressed pacing, fidgeting Consolability: distracted or reassured by voice/touch PAINAD Score: 4 Pain Location: Rt hip Pain Descriptors / Indicators: Discomfort,  Grimacing, Guarding, Moaning Pain Intervention(s): Limited activity within patient's tolerance, Monitored during session, Repositioned    Home Living Family/patient expects to be discharged to:: Private residence Living Arrangements: Children Available Help at Discharge: Family;Available 24 hours/day Type of Home: House Home Access: Stairs to enter Entrance Stairs-Rails: Can reach both;Left;Right Entrance Stairs-Number of Steps: 4-5   Home Layout: One level Home Equipment: BSC/3in1;Shower seat;Rolling Environmental consultant (2 wheels);Standard Walker;Cane - single point Additional Comments: daughter has been living with him since fall in April.    Prior Function Prior Level of Function : Independent/Modified Independent;Needs assist             Mobility Comments: pt's daughter reports PTA pt was ambulating with no AD in home and walking outside ~150-200 yds. Pt has had a decline since admission in April for PNA. Pt's daughter has been living with him since then. ADLs Comments: pt can dress mostly without assist but requires assist for butons, shoes, socks.     Hand Dominance   Dominant Hand: Right    Extremity/Trunk Assessment   Upper Extremity Assessment Upper Extremity Assessment: Generalized weakness;Defer to OT evaluation    Lower Extremity Assessment Lower Extremity Assessment: Generalized weakness;RLE deficits/detail RLE Deficits / Details: limited by pain RLE: Unable to fully assess due to pain    Cervical / Trunk Assessment Cervical / Trunk Assessment: Kyphotic  Communication   Communication: HOH  Cognition Arousal/Alertness: Awake/alert Behavior During Therapy: WFL for tasks assessed/performed Overall Cognitive Status: Impaired/Different from baseline Area of Impairment: Attention, Orientation, Following commands, Memory, Safety/judgement, Awareness, Problem solving                 Orientation Level: Disoriented to, Place, Time, Situation Current Attention Level:  Focused Memory: Decreased recall of precautions, Decreased short-term memory Following Commands: Follows one step commands inconsistently Safety/Judgement: Decreased awareness of safety, Decreased awareness of deficits   Problem Solving: Slow processing, Decreased initiation, Difficulty sequencing, Requires verbal cues, Requires tactile cues General Comments: pt disoriented to situation and drowsy at start, appeared to be sleep talking vs disoriented. eyes close majority of session, eyes open once sitting EOB and when being reposition in supine.        General Comments      Exercises     Assessment/Plan    PT Assessment Patient needs continued PT services  PT Problem List Decreased activity tolerance;Decreased range of motion;Decreased cognition;Decreased strength;Decreased balance;Decreased mobility;Decreased coordination;Decreased knowledge of use of DME;Decreased safety awareness;Decreased knowledge of precautions;Pain;Cardiopulmonary status limiting activity       PT Treatment Interventions Stair training;DME instruction;Gait training;Functional mobility training;Therapeutic activities;Therapeutic exercise;Balance training;Patient/family education    PT Goals (Current goals can be found in the Care Plan section)  Acute Rehab PT Goals Patient Stated Goal: to regain function PT Goal Formulation: With family Time For Goal Achievement: 09/28/22 Potential to Achieve Goals: Fair    Frequency Min 2X/week     Co-evaluation               AM-PAC PT "6 Clicks" Mobility  Outcome Measure Help needed turning from your back to your side while in a flat bed without using bedrails?: Total Help needed moving from lying on your back to sitting  on the side of a flat bed without using bedrails?: Total Help needed moving to and from a bed to a chair (including a wheelchair)?: Total Help needed standing up from a chair using your arms (e.g., wheelchair or bedside chair)?: Total Help  needed to walk in hospital room?: Total Help needed climbing 3-5 steps with a railing? : Total 6 Click Score: 6    End of Session   Activity Tolerance: Patient limited by lethargy;Patient limited by pain     PT Visit Diagnosis: Unsteadiness on feet (R26.81);Muscle weakness (generalized) (M62.81);History of falling (Z91.81);Difficulty in walking, not elsewhere classified (R26.2);Pain Pain - Right/Left: Right Pain - part of body: Hip    Time: [id number] PT Time Calculation (min) (ACUTE ONLY): 24 min   Charges:   PT Evaluation $PT Eval Moderate Complexity: 1 [place], DPT Acute Rehabilitation Services Office [phone]  09/14/22 3:44 PM

## 2022-09-15 ENCOUNTER — Institutional Professional Consult (permissible substitution): Payer: Medicare PPO | Admitting: Internal Medicine

## 2022-09-15 DIAGNOSIS — S72001A Fracture of unspecified part of neck of right femur, initial encounter for closed fracture: Secondary | ICD-10-CM | POA: Diagnosis not present

## 2022-09-15 DIAGNOSIS — I442 Atrioventricular block, complete: Secondary | ICD-10-CM | POA: Diagnosis not present

## 2022-09-15 DIAGNOSIS — I5032 Chronic diastolic (congestive) heart failure: Secondary | ICD-10-CM | POA: Diagnosis not present

## 2022-09-15 LAB — GLUCOSE, CAPILLARY
Glucose-Capillary: 83 mg/dL (ref 70–99)
Glucose-Capillary: 250 mg/dL — ABNORMAL HIGH (ref 70–99)
Glucose-Capillary: 188 mg/dL — ABNORMAL HIGH (ref 70–99)
Glucose-Capillary: 169 mg/dL — ABNORMAL HIGH (ref 70–99)

## 2022-09-15 MED ORDER — INSULIN ASPART 100 UNIT/ML IJ SOLN
0.0000 [IU] | Freq: Three times a day (TID) | INTRAMUSCULAR | Status: DC
  Administered 2022-09-15: 2 [IU] via SUBCUTANEOUS
  Administered 2022-09-16: 1 [IU] via SUBCUTANEOUS
  Administered 2022-09-16: 5 [IU] via SUBCUTANEOUS
  Administered 2022-09-16: 3 [IU] via SUBCUTANEOUS
  Administered 2022-09-17 (×2): 2 [IU] via SUBCUTANEOUS
  Administered 2022-09-17: 5 [IU] via SUBCUTANEOUS
  Administered 2022-09-18: 3 [IU] via SUBCUTANEOUS
  Administered 2022-09-18: 1 [IU] via SUBCUTANEOUS
  Administered 2022-09-18 – 2022-09-19 (×2): 7 [IU] via SUBCUTANEOUS
  Administered 2022-09-19: 1 [IU] via SUBCUTANEOUS
  Administered 2022-09-19: 5 [IU] via SUBCUTANEOUS
  Administered 2022-09-20: 2 [IU] via SUBCUTANEOUS
  Administered 2022-09-20: 9 [IU] via SUBCUTANEOUS

## 2022-09-15 MED ORDER — INSULIN ASPART 100 UNIT/ML IJ SOLN
0.0000 [IU] | Freq: Every day | INTRAMUSCULAR | Status: DC
  Administered 2022-09-16: 3 [IU] via SUBCUTANEOUS
  Administered 2022-09-17 – 2022-09-18 (×2): 2 [IU] via SUBCUTANEOUS
  Administered 2022-09-19: 3 [IU] via SUBCUTANEOUS

## 2022-09-15 NOTE — Progress Notes (Signed)
PROGRESS NOTE    David Cervantes  FXT:[id number] DOB: [birth year] DOA: 09/12/2022 PCP: [name], MD    Brief Narrative:  88 year old with chronic diastolic heart failure, type 2 diabetes, pulmonary arterial hypertension, hyperlipidemia, dementia from home with daughter presented with right hip pain after fall.  Thought to be mechanical fall.  Recently getting work-up for heart block. Patient was found to have right hip fracture underwent ORIF with IM nail. Developed appropriate postop confusion and delirium.   Assessment & Plan:   Closed traumatic right hip fracture: Status post ORIF with IM nail Dr. Marlou Sa 10/2.  Adequate pain medications.  Minimize narcotics. DVT prophylaxis aspirin 81 mg p.o. twice daily. Nonweightbearing right lower extremity.  Toe-touch weightbearing. PT OT, anticipate SNF placement.  Stable for transfer.  Junctional rhythm: Remained stable perioperative.  Asymptomatic.  Followed by cardiology.  EP to follow outpatient.  Continue monitoring.  Chronic diastolic heart failure: Stable.  Euvolemic on clinical exam.  Continue to hold Lasix.  Type 2 diabetes: Well-controlled with hypoglycemia.  Repeat A1c 9.8.  Encourage oral intake today.  Decrease dose of Lantus 5 units nightly.  Continue metformin.  Continue sliding scale insulin.  Avoid hypoglycemia with poor appetite.  Essential hypertension: On ramipril.  Resumed.  Postop delirium in a patient with dementia: All-time fall precautions.  Delirium precautions.  Unable to use antipsychotics due to prolonged junctional rhythms.  We will use low-dose Ativan as needed. In order to minimize intervention, minimize agitation and delirium, will discontinue cardiac telemetry, can stay without IV line.  Changed to MedSurg bed.  Minimize nighttime intervention.    Interdisciplinary Goals of Care Family Meeting   Date carried out: 09/15/2022  Location of the meeting: Bedside  Member's involved: Physician, Social  Worker, and Family Member or next of kin  Durable Power of Attorney or Loss adjuster, chartered: Daughter  Discussion: We discussed goals of care for [employer] .  DNR, continue all reasonable treatment  Code status: Full DNR  Disposition: SNF/LTAC  Time spent for the meeting: 40 minutes    Barb Merino, MD  09/15/2022, 1:46 PM     DVT prophylaxis: SCDs Start: 09/13/22 0132 Place TED hose Start: 09/13/22 0132 SCDs Start: 09/12/22 1329   Code Status: DNR. Family Communication: Daughter at the bedside Disposition Plan: Status is: Inpatient Remains inpatient appropriate because: Immediate postop.     Consultants:  Orthopedics Cardiology  Procedures:  ORIF and IM nail right hip  Antimicrobials:  Perioperative cefazolin   Subjective:  Patient seen and examined.  Mostly sleepy overnight.  Not very interactive today.  Was not willing to eat much.  Remains confused at times at night.  He is pulling on cardiac monitors.  Denies any pain.  Occasionally impulsive.  Daughter at the bedside all the time.  Objective: Vitals:   09/14/22 0543 09/14/22 1509 09/14/22 2001 09/15/22 0428  BP: (!) 147/68 (!) 179/56  136/69  Pulse: 71 (!) 52 62 (!) 56  Resp:  (!) 22 20 16   Temp: 98.5 F (36.9 C) 99.2 F (37.3 C) 98.7 F (37.1 C) 97.8 F (36.6 C)  TempSrc:  Oral Axillary Oral  SpO2: 100% 97% 96% 98%    Intake/Output Summary (Last 24 hours) at 09/15/2022 1344 Last data filed at 09/15/2022 0439 Gross per 24 hour  Intake --  Output 1050 ml  Net -1050 ml   There were no vitals filed for this visit.  Examination:  General exam: Appears fidgety and impulsive on waking up.  Mostly sleepy. bruising around the right eye. Respiratory system: Clear to auscultation. Respiratory effort normal. Cardiovascular system: S1 & S2 heard, RRR.  No pedal edema. Gastrointestinal system: Soft.  Nontender.  Bowel sound present.   Central nervous system: Moves all  extremities. Right lateral thigh incision clean and dry.  Distal neurovascular status intact.    Data Reviewed: I have personally reviewed following labs and imaging studies  CBC: Recent Labs  Lab 09/12/22 0932 09/13/22 0450  WBC 9.7 12.2*  NEUTROABS 7.7  --   HGB 10.0* 10.1*  HCT 30.3* 31.1*  MCV 93.8 94.2  PLT 198 [id number]   Basic Metabolic Panel: Recent Labs  Lab 09/12/22 0932 09/12/22 0945 09/13/22 0450  NA 134*  --  134*  K 3.4*  --  4.1  CL 104  --  102  CO2 27  --  23  GLUCOSE 219*  --  274*  BUN 21  --  22  CREATININE 1.09  --  1.10  CALCIUM 8.0*  --  8.1*  MG  --  2.0  --    GFR: Estimated Creatinine Clearance: 34.4 mL/min (by C-G formula based on SCr of 1.1 mg/dL). Liver Function Tests: Recent Labs  Lab 09/12/22 1259  ALBUMIN 2.5*   No results for input(s): "LIPASE", "AMYLASE" in the last 168 hours. No results for input(s): "AMMONIA" in the last 168 hours. Coagulation Profile: Recent Labs  Lab 09/12/22 0932  INR 1.2   Cardiac Enzymes: No results for input(s): "CKTOTAL", "CKMB", "CKMBINDEX", "TROPONINI" in the last 168 hours. BNP (last 3 results) No results for input(s): "PROBNP" in the last 8760 hours. HbA1C: No results for input(s): "HGBA1C" in the last 72 hours.  CBG: Recent Labs  Lab 09/14/22 1203 09/14/22 1638 09/14/22 2218 09/15/22 0800 09/15/22 1144  GLUCAP 241* 176* 114* 83 250*   Lipid Profile: No results for input(s): "CHOL", "HDL", "LDLCALC", "TRIG", "CHOLHDL", "LDLDIRECT" in the last 72 hours. Thyroid Function Tests: No results for input(s): "TSH", "T4TOTAL", "FREET4", "T3FREE", "THYROIDAB" in the last 72 hours. Anemia Panel: No results for input(s): "VITAMINB12", "FOLATE", "FERRITIN", "TIBC", "IRON", "RETICCTPCT" in the last 72 hours. Sepsis Labs: No results for input(s): "PROCALCITON", "LATICACIDVEN" in the last 168 hours.  No results found for this or any previous visit (from the past 240 hour(s)).       Radiology  Studies: No results found.      Scheduled Meds:  ascorbic acid  500 mg Oral Daily   aspirin  81 mg Oral BID   B-complex with vitamin C  1 tablet Oral Daily   cholecalciferol  1,000 Units Oral Once per day on Mon Wed Fri   docusate sodium  100 mg Oral BID   feeding supplement (GLUCERNA SHAKE)  237 mL Oral TID BM   furosemide  10 mg Oral q AM   insulin aspart  0-5 Units Subcutaneous QHS   insulin aspart  0-9 Units Subcutaneous TID WC   insulin glargine-yfgn  5 Units Subcutaneous QHS   magnesium oxide  200 mg Oral Daily   metFORMIN  500 mg Oral Q breakfast   potassium chloride  5 mEq Oral Daily   ramipril  2.5 mg Oral Daily   zinc sulfate  220 mg Oral Daily   Continuous Infusions:  epinephrine     methocarbamol (ROBAXIN) IV       LOS: 3 days    Time spent: 35 minutes    Barb Merino, MD [facility] Pager [phone]

## 2022-09-15 NOTE — TOC Initial Note (Signed)
Transition of Care [facility]) - Initial/Assessment Note    Patient Details  Name: David Cervantes MRN: [id number] Date of Birth: August 17, 1927  [facility]) CM/SW Contact:    Vassie Moselle, LCSW Phone Number: 09/15/2022, 10:46 AM  Clinical Narrative:                 Met with pt's daughter in room to discuss recommendations for SNF. She is agreeable to this plan and reports pt has not been to SNF prior to this. She is somewhat familiar with [facility] and states that this is her top choice for placement.  CSW referred pt out for SNF placement and currently awaiting bed offers.   Expected Discharge Plan: Skilled Nursing Facility Barriers to Discharge: No Barriers Identified   Patient Goals and CMS Choice Patient states their goals for this hospitalization and ongoing recovery are:: To transfer to SNF per daughter CMS Medicare.gov Compare Post Acute Care list provided to:: Patient Represenative (must comment) (Daughter) Choice offered to / list presented to : Adult Children  Expected Discharge Plan and Services Expected Discharge Plan: [place] In-house Referral: NA Discharge Planning Services: CM Consult Post Acute Care Choice: [place] Living arrangements for the past 2 months: Single Family Home                 DME Arranged: N/A DME Agency: NA                  Prior Living Arrangements/Services Living arrangements for the past 2 months: Single Family Home Lives with:: Adult Children Patient language and need for interpreter reviewed:: Yes Do you feel safe going back to the place where you live?: Yes      Need for Family Participation in Patient Care: Yes (Comment) Care giver support system in place?: Yes (comment) Current home services: DME Criminal Activity/Legal Involvement Pertinent to Current Situation/Hospitalization: No - Comment as needed  Activities of Daily Living Home Assistive Devices/Equipment: None ADL  Screening (condition at time of admission) Patient's cognitive ability adequate to safely complete daily activities?: No Is the patient deaf or have difficulty hearing?: Yes Does the patient have difficulty seeing, even when wearing glasses/contacts?: No Does the patient have difficulty concentrating, remembering, or making decisions?: Yes Patient able to express need for assistance with ADLs?: Yes Does the patient have difficulty dressing or bathing?: Yes Independently performs ADLs?: No Communication: Independent Is this a change from baseline?: Pre-admission baseline Dressing (OT): Needs assistance Is this a change from baseline?: Pre-admission baseline Grooming: Needs assistance Is this a change from baseline?: Pre-admission baseline Feeding: Independent Bathing: Needs assistance Is this a change from baseline?: Pre-admission baseline Toileting: Dependent Is this a change from baseline?: Pre-admission baseline In/Out Bed: Needs assistance Is this a change from baseline?: Pre-admission baseline Walks in Home: Independent Does the patient have difficulty walking or climbing stairs?: Yes Weakness of Legs: Both Weakness of Arms/Hands: Both  Permission Sought/Granted Permission sought to share information with : Facility Sport and exercise psychologist, Family Supports Permission granted to share information with : No              Emotional Assessment Appearance:: Appears stated age Attitude/Demeanor/Rapport: Unable to Assess Affect (typically observed): Unable to Assess Orientation: : Oriented to Self Alcohol / Substance Use: Not Applicable Psych Involvement: No (comment)  Admission diagnosis:  Complete heart block ([place]) [I44.2] Closed right hip fracture ([place]) [S72.001A] Fall, initial encounter [W19.XXXA] Closed displaced intertrochanteric fracture of right femur, initial encounter ([place]) [S72.141A]  Patient Active Problem List   Diagnosis Date Noted   Protein-calorie  malnutrition, severe 09/13/2022   Closed right hip fracture ([place]) 09/12/2022   Chronic diastolic CHF (congestive heart failure) ([place]) 09/12/2022   Complete heart block ([place]) 09/12/2022   Hypokalemia 09/12/2022   Normocytic anemia 09/12/2022   Dementia ([place]) 04/20/2022   Mixed hyperlipidemia 04/20/2022   Acute respiratory failure with hypoxia ([place]) 04/20/2022   DM (diabetes mellitus) ([place]) 04/20/2022   HTN (hypertension) 04/20/2022   Acute CHF ([place]) 04/20/2022   PCP:  [name], MD Pharmacy:   [facility] ([place]), [place] - [address] [place] ([place]) [place] [address] Phone: [phone] Fax: [phone]     Social Determinants of Health (SDOH) Interventions Housing Interventions: Intervention Not Indicated  Readmission Risk Interventions    09/15/2022   10:45 AM  Readmission Risk Prevention Plan  Transportation Screening Complete  PCP or Specialist Appt within 5-7 Days Complete  Home Care Screening Complete  Medication Review (RN CM) Complete

## 2022-09-15 NOTE — Care Management Important Message (Signed)
Important Message  Patient Details IM Letter given to [name]. Name: David Cervantes MRN: [id number] Date of Birth: 04-14-1927   Medicare Important Message Given:  Yes     Kerin Salen 09/15/2022, 9:52 AM

## 2022-09-15 NOTE — NC FL2 (Signed)
[place] LEVEL OF CARE SCREENING TOOL     IDENTIFICATION  Patient Name: David Cervantes Birthdate: 03-16-1927 Sex: male Admission Date (Current Location): 09/12/2022  [facility] and [place] Number:  Herbalist and Address:  [facility],  [place], [place]      Provider Number: [id number]  Attending Physician Name and Address:  Barb Merino, MD  Relative Name and Phone Number:  Daughter, Rebecca Dagley [phone]    Current Level of Care: Hospital Recommended Level of Care: [place] Prior Approval Number:    Date Approved/Denied:   PASRR Number: [id number]  Discharge Plan: SNF    Current Diagnoses: Patient Active Problem List   Diagnosis Date Noted   Protein-calorie malnutrition, severe 09/13/2022   Closed right hip fracture ([place]) 09/12/2022   Chronic diastolic CHF (congestive heart failure) ([place]) 09/12/2022   Complete heart block ([place]) 09/12/2022   Hypokalemia 09/12/2022   Normocytic anemia 09/12/2022   Dementia ([place]) 04/20/2022   Mixed hyperlipidemia 04/20/2022   Acute respiratory failure with hypoxia ([place]) 04/20/2022   DM (diabetes mellitus) ([place]) 04/20/2022   HTN (hypertension) 04/20/2022   Acute CHF ([place]) 04/20/2022    Orientation RESPIRATION BLADDER Height & Weight     Self  Normal Incontinent, External catheter Weight:   Height:     BEHAVIORAL SYMPTOMS/MOOD NEUROLOGICAL BOWEL NUTRITION STATUS      Incontinent Diet (Regular)  AMBULATORY STATUS COMMUNICATION OF NEEDS Skin   Total Care Verbally Normal                       Personal Care Assistance Level of Assistance  Bathing, Feeding, Dressing Bathing Assistance: Limited assistance Feeding assistance: Limited assistance Dressing Assistance: Limited assistance     Functional Limitations Info  Sight, Hearing, Speech Sight Info: Adequate Hearing Info: Impaired Speech Info: Impaired    SPECIAL CARE FACTORS FREQUENCY   PT (By licensed PT), OT (By licensed OT)     PT Frequency: 5x/wk OT Frequency: 5x/wk            Contractures Contractures Info: Not present    Additional Factors Info  Code Status, Allergies Code Status Info: DNR Allergies Info: Tape           Current Medications (09/15/2022):  This is the current hospital active medication list Current Facility-Administered Medications  Medication Dose Route Frequency Provider Last Rate Last Admin   acetaminophen (TYLENOL) tablet 325-650 mg  325-650 mg Oral Q6H PRN Magnant, Charles L, PA-C       ascorbic acid (VITAMIN C) tablet 500 mg  500 mg Oral Daily Barb Merino, MD   500 mg at 09/15/22 [id number]   aspirin chewable tablet 81 mg  81 mg Oral BID Magnant, Charles L, PA-C   81 mg at 09/15/22 [id number]   B-complex with vitamin C tablet 1 tablet  1 tablet Oral Daily Barb Merino, MD   1 tablet at 09/15/22 [id number]   cholecalciferol (VITAMIN D3) 25 MCG (1000 UNIT) tablet 1,000 Units  1,000 Units Oral Once per day on Mon Wed Fri Ghimire, Kuber, MD   1,000 Units at 09/15/22 0957   docusate sodium (COLACE) capsule 100 mg  100 mg Oral BID Magnant, Charles L, PA-C   100 mg at 09/15/22 0957   EPINEPHrine (ADRENALIN) 5 mg in NS 250 mL (0.02 mg/mL) premix infusion  0.5-20 mcg/min Intravenous Titrated Ellington, Abby K, RPH       feeding supplement ([place]) ([place]  SHAKE) liquid 237 mL  237 mL Oral TID BM Barb Merino, MD   237 mL at 09/15/22 1016   furosemide (LASIX) tablet 10 mg  10 mg Oral q AM Barb Merino, MD   10 mg at 09/15/22 0958   HYDROcodone-acetaminophen (NORCO/VICODIN) 5-325 MG per tablet 1 tablet  1 tablet Oral Q6H PRN Magnant, Charles L, PA-C       insulin aspart (novoLOG) injection 0-15 Units  0-15 Units Subcutaneous TID WC Kyle, Tyrone A, DO   3 Units at 09/14/22 1832   insulin aspart (novoLOG) injection 0-5 Units  0-5 Units Subcutaneous QHS Kyle, Tyrone A, DO       insulin glargine-yfgn (SEMGLEE) injection 5 Units  5 Units Subcutaneous  QHS Barb Merino, MD   5 Units at 09/14/22 2301   LORazepam (ATIVAN) tablet 0.5 mg  0.5 mg Oral Q4H PRN Barb Merino, MD       magnesium oxide (MAG-OX) tablet 200 mg  200 mg Oral Daily Barb Merino, MD   200 mg at 09/15/22 0957   menthol-cetylpyridinium (CEPACOL) lozenge 3 mg  1 lozenge Oral PRN Magnant, Charles L, PA-C       Or   phenol (CHLORASEPTIC) mouth spray 1 spray  1 spray Mouth/Throat PRN Magnant, Charles L, PA-C       metFORMIN (GLUCOPHAGE-XR) 24 hr tablet 500 mg  500 mg Oral Q breakfast Barb Merino, MD   500 mg at 09/15/22 0957   methocarbamol (ROBAXIN) tablet 500 mg  500 mg Oral Q6H PRN Magnant, Charles L, PA-C       Or   methocarbamol (ROBAXIN) 500 mg in dextrose 5 % 50 mL IVPB  500 mg Intravenous Q6H PRN Magnant, Charles L, PA-C       metoCLOPramide (REGLAN) tablet 5-10 mg  5-10 mg Oral Q8H PRN Magnant, Charles L, PA-C       Or   metoCLOPramide (REGLAN) injection 5-10 mg  5-10 mg Intravenous Q8H PRN Magnant, Charles L, PA-C       morphine (PF) 2 MG/ML injection 0.5 mg  0.5 mg Intravenous Q2H PRN Magnant, Charles L, PA-C       ondansetron (ZOFRAN) tablet 4 mg  4 mg Oral Q6H PRN Magnant, Charles L, PA-C       Or   ondansetron (ZOFRAN) injection 4 mg  4 mg Intravenous Q6H PRN Magnant, Charles L, PA-C       potassium chloride (KLOR-CON M) CR tablet 5 mEq  5 mEq Oral Daily Barb Merino, MD   5 mEq at 09/15/22 1039   ramipril (ALTACE) capsule 2.5 mg  2.5 mg Oral Daily Barb Merino, MD   2.5 mg at 09/15/22 8938   zinc sulfate capsule 220 mg  220 mg Oral Daily Barb Merino, MD   220 mg at 09/15/22 0957     Discharge Medications: Please see discharge summary for a list of discharge medications.  Relevant Imaging Results:  Relevant Lab Results:   Additional Information SSN: [id number]  Vassie Moselle, LCSW

## 2022-09-15 NOTE — Inpatient Diabetes Management (Signed)
Inpatient Diabetes Program Recommendations  AACE/ADA: New Consensus Statement on Inpatient Glycemic Control (2015)  Target Ranges:  Prepandial:   less than 140 mg/dL      Peak postprandial:   less than 180 mg/dL (1-2 hours)      Critically ill patients:  140 - 180 mg/dL   Lab Results  Component Value Date   GLUCAP 83 09/15/2022   HGBA1C 9.8 (H) 09/12/2022    Review of Glycemic Control  Latest Reference Range & Units 09/14/22 07:38 09/14/22 12:03 09/14/22 16:38 09/14/22 22:18 09/15/22 08:00  Glucose-Capillary 70 - 99 mg/dL 63 (L) 241 (H) 176 (H) 114 (H) 83  (L): Data is abnormally low (H): Data is abnormally high  Diabetes history: Type 2 DM Outpatient Diabetes medications: Lantus 8-9 units QHS, Metformin 500 mg QD Current orders for Inpatient glycemic control: Semglee 5 units QHS, Novolog 0-15 units TID & HS, Metformin 500 mg QAM     Inpatient Diabetes Program Recommendations:     Semglee 4 units QHS Novolog 0-9 units TID  Will continue to follow while inpatient.  Thank you, Reche Dixon, MSN, [place] Diabetes Coordinator Inpatient Diabetes Program [phone] (team pager from 8a-5p)

## 2022-09-16 ENCOUNTER — Encounter (HOSPITAL_COMMUNITY): Payer: Self-pay | Admitting: Internal Medicine

## 2022-09-16 ENCOUNTER — Inpatient Hospital Stay (HOSPITAL_COMMUNITY): Payer: Medicare PPO

## 2022-09-16 DIAGNOSIS — I442 Atrioventricular block, complete: Secondary | ICD-10-CM | POA: Diagnosis not present

## 2022-09-16 DIAGNOSIS — I5032 Chronic diastolic (congestive) heart failure: Secondary | ICD-10-CM | POA: Diagnosis not present

## 2022-09-16 DIAGNOSIS — S72001A Fracture of unspecified part of neck of right femur, initial encounter for closed fracture: Secondary | ICD-10-CM | POA: Diagnosis not present

## 2022-09-16 LAB — GLUCOSE, CAPILLARY
Glucose-Capillary: 141 mg/dL — ABNORMAL HIGH (ref 70–99)
Glucose-Capillary: 204 mg/dL — ABNORMAL HIGH (ref 70–99)
Glucose-Capillary: 266 mg/dL — ABNORMAL HIGH (ref 70–99)
Glucose-Capillary: 273 mg/dL — ABNORMAL HIGH (ref 70–99)

## 2022-09-16 MED ORDER — ASPIRIN 81 MG PO CHEW: 81.0000 mg | CHEWABLE_TABLET | Freq: Two times a day (BID) | ORAL | 0 refills | Status: AC

## 2022-09-16 MED ORDER — HYDROCODONE-ACETAMINOPHEN 5-325 MG PO TABS: 1.0000 | ORAL_TABLET | Freq: Four times a day (QID) | ORAL | 0 refills | Status: AC | PRN

## 2022-09-16 MED ORDER — LORAZEPAM 0.5 MG PO TABS: 0.5000 mg | ORAL_TABLET | Freq: Three times a day (TID) | ORAL | 0 refills | Status: DC | PRN

## 2022-09-16 MED ORDER — LANTUS SOLOSTAR 100 UNIT/ML ~~LOC~~ SOPN: 8.0000 [IU] | PEN_INJECTOR | Freq: Every day | SUBCUTANEOUS | 11 refills | Status: AC

## 2022-09-16 MED ORDER — ACETAMINOPHEN 325 MG PO TABS: 325.0000 mg | ORAL_TABLET | Freq: Four times a day (QID) | ORAL | Status: AC | PRN

## 2022-09-16 MED ORDER — DOCUSATE SODIUM 100 MG PO CAPS: 100.0000 mg | ORAL_CAPSULE | Freq: Two times a day (BID) | ORAL | 0 refills | Status: AC

## 2022-09-16 NOTE — TOC Progression Note (Addendum)
Transition of Care [facility]) - Progression Note    Patient Details  Name: David Cervantes MRN: [id number] Date of Birth: 02-Mar-1927  [facility]) CM/SW [place], LCSW Phone Number: 09/16/2022, 12:20 PM  Clinical Narrative:    CSW reviewed bed offers for SNF with pt's daughter. She has chosen SNF placement at [facility]. Insurance Josem Kaufmann has been requested.   Expected Discharge Plan: [place] Barriers to Discharge: No Barriers Identified  Expected Discharge Plan and Services Expected Discharge Plan: [place] In-house Referral: NA Discharge Planning Services: CM Consult Post Acute Care Choice: [place] Living arrangements for the past 2 months: Single Family Home                 DME Arranged: N/A DME Agency: NA                   Social Determinants of Health (SDOH) Interventions Housing Interventions: Intervention Not Indicated  Readmission Risk Interventions    09/15/2022   10:45 AM  Readmission Risk Prevention Plan  Transportation Screening Complete  PCP or Specialist Appt within 5-7 Days Complete  Home Care Screening Complete  Medication Review (RN CM) Complete

## 2022-09-16 NOTE — Progress Notes (Signed)
Patient stable Daughter in room Dressings intact Patient is having some pain when standing and pivoting Radiographs from today right hip look good in terms of hardware alignment and fracture position Plan at this time is weightbearing for transfers only due to calcar fragment which is likely not yet healed. Okay to start weightbearing likely in 1 to 1-1/2 weeks once that medial calcar fragment has started to heal some to the rest of the proximal femur He will need follow-up 1 week from tomorrow Aspirin 81 mg twice a day for DVT prophylaxis and Norco for pain

## 2022-09-16 NOTE — Progress Notes (Addendum)
Physical Therapy Treatment Patient Details Name: David Cervantes MRN: [id number] DOB: 03/06/1927 Today's Date: 09/16/2022   History of Present Illness 88 y.o. male with chronic diastolic heart failure, type 2 diabetes, pulmonary arterial hypertension, hyperlipidemia, dementia from home with daughter presented with right hip pain after fall. Patient was found to have right hip fracture underwent ORIF with IM nail on 09/13/22.    PT Comments    Patient much more alert today and more oriented, able to follow commands/cues for transfers. Pt required Mod Assist to sit up to EOB and attempted to move LE's off side with assist. Mod+2 to stand with RW from EOB. Cue to avoid WB on Rt LE and manual assist to weight shift to Lt LE in standing for NWB. Pt stood ~4x and attempted 1x with [facility] for stand pivot however pt gripping armrest of recliner and preventing pivot due to anxiety. 1x attempt for squat pivot however pt resisting with posterior lean and gripping armrest and transfer OOB deferred. Pt required Total Assist to return to supine and boost in bed. Reposition in slight chair position, pt fatigued but denied pain in Rt LE and resting comfortably in bed. Will continue to progress as able. Continue to recommend SNF rehab.     Recommendations for follow up therapy are one component of a multi-disciplinary discharge planning process, led by the attending physician.  Recommendations may be updated based on patient status, additional functional criteria and insurance authorization.  Follow Up Recommendations  Skilled nursing-short term rehab (<3 hours/day) Can patient physically be transported by private vehicle: No   Assistance Recommended at Discharge Frequent or constant Supervision/Assistance  Patient can return home with the following Two people to help with walking and/or transfers;Two people to help with bathing/dressing/bathroom;Assistance with cooking/housework;Assistance with feeding;Direct  supervision/assist for financial management;Direct supervision/assist for medications management;Assist for transportation;Help with stairs or ramp for entrance   Equipment Recommendations  None recommended by PT    Recommendations for Other Services       Precautions / Restrictions Precautions Precautions: Fall Restrictions Weight Bearing Restrictions: Yes RLE Weight Bearing: Non weight bearing Other Position/Activity Restrictions: TDWB for transfer only with RW     Mobility  Bed Mobility Overal bed mobility: Needs Assistance Bed Mobility: Supine to Sit, Sit to Supine     Supine to sit: +2 for safety/equipment, HOB elevated, Mod assist, +2 for physical assistance Sit to supine: Total assist, +2 for physical assistance, +2 for safety/equipment        Transfers Overall transfer level: Needs assistance Equipment used: Rolling walker (2 wheels), 2 person hand held assist Transfers: Sit to/from Stand Sit to Stand: Mod assist, +2 physical assistance, +2 safety/equipment, Max assist, From elevated surface           General transfer comment: od+2 to stand with RW from EOB. Cue to avoid WB on Rt LE and manual assist to weight shift to Lt LE in standing for NWB. Pt stood ~4x and attempted 1x with [facility] for stand pivot however pt gripping armrest of recliner and preventing pivot due to anxiety. 1x attempt for squat pivot however pt resisting with posterior lean and gripping armrest and transfer deferred.    Ambulation/Gait                   Stairs             Wheelchair Mobility    Modified Rankin (Stroke Patients Only)       Balance  Cognition Arousal/Alertness: Awake/alert Behavior During Therapy: WFL for tasks assessed/performed Overall Cognitive Status: Impaired/Different from baseline Area of Impairment: Attention, Orientation, Following commands, Memory, Safety/judgement, Awareness,  Problem solving                 Orientation Level: Disoriented to, Time Current Attention Level: Sustained Memory: Decreased recall of precautions, Decreased short-term memory Following Commands: Follows one step commands with increased time, Follows one step commands consistently, Follows multi-step commands inconsistently Safety/Judgement: Decreased awareness of safety   Problem Solving: Slow processing, Decreased initiation, Difficulty sequencing, Requires verbal cues, Requires tactile cues General Comments: pt more oriented and holding normal conversation. Pt very anxious regarding mobility and maximal reassurance for safety with mobility.        Exercises      General Comments        Pertinent Vitals/Pain Pain Assessment Pain Assessment: Faces Pain Score: 6  Pain Location: Rt hip Pain Descriptors / Indicators: Discomfort, Grimacing, Guarding, Moaning Pain Intervention(s): Limited activity within patient's tolerance, Monitored during session, Repositioned    Home Living                          Prior Function            PT Goals (current goals can now be found in the care plan section) Acute Rehab PT Goals Patient Stated Goal: to regain function PT Goal Formulation: With family Time For Goal Achievement: 09/28/22 Potential to Achieve Goals: Fair Progress towards PT goals: Progressing toward goals    Frequency    Min 2X/week      PT Plan Current plan remains appropriate    Co-evaluation              AM-PAC PT "6 Clicks" Mobility   Outcome Measure  Help needed turning from your back to your side while in a flat bed without using bedrails?: Total Help needed moving from lying on your back to sitting on the side of a flat bed without using bedrails?: Total Help needed moving to and from a bed to a chair (including a wheelchair)?: Total Help needed standing up from a chair using your arms (e.g., wheelchair or bedside chair)?:  Total Help needed to walk in hospital room?: Total Help needed climbing 3-5 steps with a railing? : Total 6 Click Score: 6    End of Session Equipment Utilized During Treatment: Gait belt Activity Tolerance: Patient tolerated treatment well;Patient limited by fatigue Patient left: in bed;with call bell/phone within reach;with bed alarm set;with family/visitor present Nurse Communication: Mobility status;Need for lift equipment PT Visit Diagnosis: Unsteadiness on feet (R26.81);Muscle weakness (generalized) (M62.81);History of falling (Z91.81);Difficulty in walking, not elsewhere classified (R26.2);Pain Pain - Right/Left: Right Pain - part of body: Hip     Time: 1459-1534 PT Time Calculation (min) (ACUTE ONLY): 35 min  Charges:  $Therapeutic Activity: 23-37 mins                     Verner Mould, DPT Acute Rehabilitation Services Office [phone]  09/16/22 4:54 PM

## 2022-09-16 NOTE — Progress Notes (Signed)
PROGRESS NOTE    David Cervantes  [id number] DOB: March 02, 1927 DOA: 09/12/2022 PCP: [name], MD    Brief Narrative:  88 year old with chronic diastolic heart failure, type 2 diabetes, pulmonary arterial hypertension, hyperlipidemia, dementia from home with daughter presented with right hip pain after fall.  Thought to be mechanical fall.  Recently getting work-up for heart block. Patient was found to have right hip fracture underwent ORIF with IM nail. Developed appropriate postop confusion and delirium that has improved now.   Assessment & Plan:   Closed traumatic right hip fracture: Status post ORIF with IM nail Dr. Marlou Sa 10/2.   DVT prophylaxis aspirin 81 mg p.o. twice daily. Nonweightbearing right lower extremity.  Toe-touch weightbearing. PT OT, anticipate SNF placement.  Stable for transfer.  Junctional rhythm: Remained stable perioperative.  Asymptomatic.  Followed by cardiology.  EP to follow outpatient.  Continue monitoring.  Chronic diastolic heart failure: Stable.  Euvolemic on clinical exam.  Resume Lasix on discharge.  Type 2 diabetes: Well-controlled with hypoglycemia.  Repeat A1c 9.8.  We will keep on metformin, Levemir 8 units in the morning.  Can keep on sliding scale insulin.    Essential hypertension: On ramipril.  Resumed.  Postop delirium in a patient with dementia: All-time fall precautions.  Delirium precautions. Much improved without administration of medications.  Minimize nighttime interventions.      Interdisciplinary Goals of Care Family Meeting   Date carried out: 09/16/2022  Location of the meeting: Bedside  Member's involved: Physician, Social Worker, and Family Member or next of kin  Durable Power of Attorney or Loss adjuster, chartered: Daughter  Discussion: We discussed goals of care for [employer] .  DNR, continue all reasonable treatment  Code status: Full DNR  Disposition: SNF/LTAC    Barb Merino,  MD  09/16/2022, 1:48 PM     DVT prophylaxis: SCDs Start: 09/13/22 0132 Place TED hose Start: 09/13/22 0132 SCDs Start: 09/12/22 1329   Code Status: DNR. Family Communication: Daughter at the bedside Disposition Plan: Status is: Inpatient Remains inpatient appropriate because: Waiting for insurance prior authorization.  Medically stable.     Consultants:  Orthopedics Cardiology  Procedures:  ORIF and IM nail right hip  Antimicrobials:  Perioperative cefazolin   Subjective:  Patient was seen and examined.  He is more awake and interactive today.  He tells me he is "doing well given the circumstances being in the hospital".  Daughter reported restful night with not much agitation but occasional pulling of cloths.  No pain at bedrest.  Hurting on mobility.  Objective: Vitals:   09/15/22 0428 09/15/22 1440 09/15/22 2106 09/16/22 0641  BP: 136/69 (!) 146/50 (!) 149/76 (!) 124/100  Pulse: (!) 56 (!) 55 (!) 54 (!) 51  Resp: 16 (!) 23 20 16   Temp: 97.8 F (36.6 C) 98.2 F (36.8 C) 98.9 F (37.2 C) 98.4 F (36.9 C)  TempSrc: Oral Oral  Oral  SpO2: 98% 92% 94% 93%    Intake/Output Summary (Last 24 hours) at 09/16/2022 1348 Last data filed at 09/16/2022 1210 Gross per 24 hour  Intake 440 ml  Output 2250 ml  Net -1810 ml   There were no vitals filed for this visit.  Examination:  General: Looks fairly comfortable.  He is alert awake interactive.  Oriented 2-3.  Sometimes impulsive. Cardiovascular: S1-S2 normal.  Regular rate rhythm. Respiratory: Bilateral clear.  No added sounds. Gastrointestinal: Soft.  Nontender.  Bowel sound present. Ext: No swelling or cyanosis.  No  edema.  Right lateral thigh incision clean and dry.      Data Reviewed: I have personally reviewed following labs and imaging studies  CBC: Recent Labs  Lab 09/12/22 0932 09/13/22 0450  WBC 9.7 12.2*  NEUTROABS 7.7  --   HGB 10.0* 10.1*  HCT 30.3* 31.1*  MCV 93.8 94.2  PLT 198 242    Basic Metabolic Panel: Recent Labs  Lab 09/12/22 0932 09/12/22 0945 09/13/22 0450  NA 134*  --  134*  K 3.4*  --  4.1  CL 104  --  102  CO2 27  --  23  GLUCOSE 219*  --  274*  BUN 21  --  22  CREATININE 1.09  --  1.10  CALCIUM 8.0*  --  8.1*  MG  --  2.0  --    GFR: Estimated Creatinine Clearance: 34.4 mL/min (by C-G formula based on SCr of 1.1 mg/dL). Liver Function Tests: Recent Labs  Lab 09/12/22 1259  ALBUMIN 2.5*   No results for input(s): "LIPASE", "AMYLASE" in the last 168 hours. No results for input(s): "AMMONIA" in the last 168 hours. Coagulation Profile: Recent Labs  Lab 09/12/22 0932  INR 1.2   Cardiac Enzymes: No results for input(s): "CKTOTAL", "CKMB", "CKMBINDEX", "TROPONINI" in the last 168 hours. BNP (last 3 results) No results for input(s): "PROBNP" in the last 8760 hours. HbA1C: No results for input(s): "HGBA1C" in the last 72 hours.  CBG: Recent Labs  Lab 09/15/22 1144 09/15/22 1649 09/15/22 2102 09/16/22 0748 09/16/22 1130  GLUCAP 250* 188* 169* 141* 204*   Lipid Profile: No results for input(s): "CHOL", "HDL", "LDLCALC", "TRIG", "CHOLHDL", "LDLDIRECT" in the last 72 hours. Thyroid Function Tests: No results for input(s): "TSH", "T4TOTAL", "FREET4", "T3FREE", "THYROIDAB" in the last 72 hours. Anemia Panel: No results for input(s): "VITAMINB12", "FOLATE", "FERRITIN", "TIBC", "IRON", "RETICCTPCT" in the last 72 hours. Sepsis Labs: No results for input(s): "PROCALCITON", "LATICACIDVEN" in the last 168 hours.  No results found for this or any previous visit (from the past 240 hour(s)).       Radiology Studies: No results found.      Scheduled Meds:  ascorbic acid  500 mg Oral Daily   aspirin  81 mg Oral BID   B-complex with vitamin C  1 tablet Oral Daily   cholecalciferol  1,000 Units Oral Once per day on Mon Wed Fri   docusate sodium  100 mg Oral BID   feeding supplement (GLUCERNA SHAKE)  237 mL Oral TID BM    furosemide  10 mg Oral q AM   insulin aspart  0-5 Units Subcutaneous QHS   insulin aspart  0-9 Units Subcutaneous TID WC   insulin glargine-yfgn  5 Units Subcutaneous QHS   magnesium oxide  200 mg Oral Daily   metFORMIN  500 mg Oral Q breakfast   potassium chloride  5 mEq Oral Daily   ramipril  2.5 mg Oral Daily   zinc sulfate  220 mg Oral Daily   Continuous Infusions:  epinephrine     methocarbamol (ROBAXIN) IV       LOS: 4 days    Time spent: 35 minutes    Barb Merino, MD [facility] Pager [phone]

## 2022-09-17 DIAGNOSIS — S72001A Fracture of unspecified part of neck of right femur, initial encounter for closed fracture: Secondary | ICD-10-CM | POA: Diagnosis not present

## 2022-09-17 DIAGNOSIS — I442 Atrioventricular block, complete: Secondary | ICD-10-CM | POA: Diagnosis not present

## 2022-09-17 DIAGNOSIS — I5032 Chronic diastolic (congestive) heart failure: Secondary | ICD-10-CM | POA: Diagnosis not present

## 2022-09-17 LAB — CBC WITH DIFFERENTIAL/PLATELET
Abs Immature Granulocytes: 0.06 10*3/uL (ref 0.00–0.07)
Basophils Absolute: 0 10*3/uL (ref 0.0–0.1)
Basophils Relative: 0 %
Eosinophils Absolute: 0.3 10*3/uL (ref 0.0–0.5)
Eosinophils Relative: 3 %
HCT: 25.7 % — ABNORMAL LOW (ref 39.0–52.0)
Hemoglobin: 8.4 g/dL — ABNORMAL LOW (ref 13.0–17.0)
Immature Granulocytes: 1 %
Lymphocytes Relative: 15 %
Lymphs Abs: 1.2 10*3/uL (ref 0.7–4.0)
MCH: 30.9 pg (ref 26.0–34.0)
MCHC: 32.7 g/dL (ref 30.0–36.0)
MCV: 94.5 fL (ref 80.0–100.0)
Monocytes Absolute: 0.8 10*3/uL (ref 0.1–1.0)
Monocytes Relative: 10 %
Neutro Abs: 5.8 10*3/uL (ref 1.7–7.7)
Neutrophils Relative %: 71 %
Platelets: 309 10*3/uL (ref 150–400)
RBC: 2.72 MIL/uL — ABNORMAL LOW (ref 4.22–5.81)
RDW: 13.4 % (ref 11.5–15.5)
WBC: 8.2 10*3/uL (ref 4.0–10.5)
nRBC: 0 % (ref 0.0–0.2)

## 2022-09-17 LAB — BASIC METABOLIC PANEL
Anion gap: 6 (ref 5–15)
BUN: 30 mg/dL — ABNORMAL HIGH (ref 8–23)
CO2: 26 mmol/L (ref 22–32)
Calcium: 7.6 mg/dL — ABNORMAL LOW (ref 8.9–10.3)
Chloride: 104 mmol/L (ref 98–111)
Creatinine, Ser: 1.25 mg/dL — ABNORMAL HIGH (ref 0.61–1.24)
GFR, Estimated: 54 mL/min — ABNORMAL LOW (ref >60)
Glucose, Bld: 192 mg/dL — ABNORMAL HIGH (ref 70–99)
Potassium: 3.7 mmol/L (ref 3.5–5.1)
Sodium: 136 mmol/L (ref 135–145)

## 2022-09-17 LAB — MAGNESIUM: Magnesium: 1.8 mg/dL (ref 1.7–2.4)

## 2022-09-17 LAB — GLUCOSE, CAPILLARY
Glucose-Capillary: 163 mg/dL — ABNORMAL HIGH (ref 70–99)
Glucose-Capillary: 173 mg/dL — ABNORMAL HIGH (ref 70–99)
Glucose-Capillary: 254 mg/dL — ABNORMAL HIGH (ref 70–99)
Glucose-Capillary: 203 mg/dL — ABNORMAL HIGH (ref 70–99)

## 2022-09-17 LAB — PHOSPHORUS: Phosphorus: 2.9 mg/dL (ref 2.5–4.6)

## 2022-09-17 MED ORDER — HALOPERIDOL 2 MG PO TABS: 2.0000 mg | ORAL_TABLET | Freq: Three times a day (TID) | ORAL | Status: DC | PRN

## 2022-09-17 MED ORDER — HALOPERIDOL LACTATE 5 MG/ML IJ SOLN
2.0000 mg | Freq: Four times a day (QID) | INTRAMUSCULAR | Status: DC | PRN
  Administered 2022-09-17: 2 mg via INTRAMUSCULAR
  Filled 2022-09-17: qty 1

## 2022-09-17 NOTE — TOC Progression Note (Addendum)
Transition of Care [facility]) - Progression Note    Patient Details  Name: Derian Dimalanta MRN: [id number] Date of Birth: September 18, 1927  [facility]) CM/SW [place], LCSW Phone Number: 09/17/2022, 9:57 AM  Clinical Narrative:    CSW received call from [place] [facility] that they did not receive clinicals on this pt when authorization request was submitted. CSW faxed in required clinical information to [place]. Pt's insurance Josem Kaufmann is currently pending.   Update 10:10am- Pt's insurance has been approved for pt to transfer to SNF. Insurance Josem Kaufmann is valid from 10/5 to 10/9. Navi ID: [id number]. Per MD pt is currently delirious and is not stable for DC. CSW updated Kitty at [place].  Update 11:30am-  [facility] reports unable to accept over weekend. Pt will be able to transfer to SNF on Monday if remains medically stable.   Expected Discharge Plan: [place] Barriers to Discharge: No Barriers Identified  Expected Discharge Plan and Services Expected Discharge Plan: [place] In-house Referral: NA Discharge Planning Services: CM Consult Post Acute Care Choice: [place] Living arrangements for the past 2 months: Single Family Home                 DME Arranged: N/A DME Agency: NA                   Social Determinants of Health (SDOH) Interventions Housing Interventions: Intervention Not Indicated  Readmission Risk Interventions    09/15/2022   10:45 AM  Readmission Risk Prevention Plan  Transportation Screening Complete  PCP or Specialist Appt within 5-7 Days Complete  Home Care Screening Complete  Medication Review (RN CM) Complete

## 2022-09-17 NOTE — Progress Notes (Signed)
PROGRESS NOTE    David Cervantes  [id number] DOB: [birth year] DOA: 09/12/2022 PCP: [name], MD    Brief Narrative:  88 year old with chronic diastolic heart failure, type 2 diabetes, pulmonary arterial hypertension, hyperlipidemia, dementia from home with daughter presented with right hip pain after fall.  Thought to be mechanical fall.  Recently getting work-up for heart block. Patient was found to have right hip fracture underwent ORIF with IM nail. Developed appropriate postop confusion and delirium that remains intermittent and fluctuating.   Assessment & Plan:   Closed traumatic right hip fracture: Status post ORIF with IM nail Dr. Marlou Sa 10/2.   DVT prophylaxis aspirin 81 mg p.o. twice daily. Nonweightbearing right lower extremity.  Toe-touch weightbearing. PT OT, anticipate SNF placement.  Surgically stable as per surgery.  Junctional rhythm: Remained stable perioperative.  Asymptomatic.  Followed by cardiology.  EP to follow outpatient.   Chronic diastolic heart failure: Stable.  Euvolemic on clinical exam.  Resume Lasix on discharge.  Type 2 diabetes: Well-controlled with hypoglycemia.  Repeat A1c 9.8.  We will keep on metformin, Levemir 8 units in the morning.  Can keep on sliding scale insulin.    Essential hypertension: On ramipril.  Stable.  Postop delirium in a patient with dementia: All-time fall precautions.  Delirium precautions. Because of junctional bradycardia, we are trying to avoid atypical antipsychotics. We will check electrolytes. Patient developed delirium overnight, was given 1 dose of Ativan.  He is more calmer in the morning.  Since he is expected to have intermittent delirium, will use low-dose of Haldol oral or IM. Monitor in the hospital today, if he remains stable, can potentially be discharged to a SNF tomorrow.      DVT prophylaxis: SCDs Start: 09/13/22 0132 Place TED hose Start: 09/13/22 0132 SCDs Start: 09/12/22 1329   Code  Status: DNR. Family Communication: Daughter at the bedside Disposition Plan: Status is: Inpatient Remains inpatient appropriate because: Unsafe discharge.  Has developed some delirium overnight, will need to monitor in the hospital before transferring to a SNF.  Patient does have a SNF bed.   Consultants:  Orthopedics Cardiology  Procedures:  ORIF and IM nail right hip  Antimicrobials:  Perioperative cefazolin   Subjective:  Patient seen and examined.  Daughter at the bedside.  All day yesterday he was quite interactive.  Overnight he became impulsive and trying to walk.  He was shaky and fidgety.  Was given 1 dose of oral Ativan.  It is making him sleepy but still not appropriately interacting. We discussed about various medication help, daughter is well aware about different medications that we use for delirium.  Discussed about using low-dose haloperidol with minimal effect on the heart and she is agreeable.  We will discontinue Ativan and rather use some haloperidol.  Objective: Vitals:   09/16/22 1943 09/16/22 2016 09/17/22 0413 09/17/22 0421  BP:  (!) 137/56 (!) 145/70   Pulse:  62 60   Resp:  19 20   Temp:  98.9 F (37.2 C) 98.2 F (36.8 C) 98.2 F (36.8 C)  TempSrc:   Oral Oral  SpO2:  95% 98%   Weight: 56.7 kg     Height: 5\' 8"  (1.727 m)       Intake/Output Summary (Last 24 hours) at 09/17/2022 1118 Last data filed at 09/16/2022 1900 Gross per 24 hour  Intake 120 ml  Output 900 ml  Net -780 ml   Filed Weights   09/16/22 1943  Weight: 56.7 kg  Examination:  General: anxious, slightly impulsive.  Tremulous.  Responds to name but goes back to sleep, confused.  Cardiovascular: S1-S2 normal.  Regular rate rhythm. Respiratory: Bilateral clear.  No added sounds. Gastrointestinal: Soft.  Nontender.  Bowel sound present. Ext: No swelling or cyanosis.  No edema.  Right lateral thigh incision clean and dry.      Data Reviewed: I have personally reviewed  following labs and imaging studies  CBC: Recent Labs  Lab 09/12/22 0932 09/13/22 0450  WBC 9.7 12.2*  NEUTROABS 7.7  --   HGB 10.0* 10.1*  HCT 30.3* 31.1*  MCV 93.8 94.2  PLT 198 [id number]   Basic Metabolic Panel: Recent Labs  Lab 09/12/22 0932 09/12/22 0945 09/13/22 0450  NA 134*  --  134*  K 3.4*  --  4.1  CL 104  --  102  CO2 27  --  23  GLUCOSE 219*  --  274*  BUN 21  --  22  CREATININE 1.09  --  1.10  CALCIUM 8.0*  --  8.1*  MG  --  2.0  --    GFR: Estimated Creatinine Clearance: 34.4 mL/min (by C-G formula based on SCr of 1.1 mg/dL). Liver Function Tests: Recent Labs  Lab 09/12/22 1259  ALBUMIN 2.5*   No results for input(s): "LIPASE", "AMYLASE" in the last 168 hours. No results for input(s): "AMMONIA" in the last 168 hours. Coagulation Profile: Recent Labs  Lab 09/12/22 0932  INR 1.2   Cardiac Enzymes: No results for input(s): "CKTOTAL", "CKMB", "CKMBINDEX", "TROPONINI" in the last 168 hours. BNP (last 3 results) No results for input(s): "PROBNP" in the last 8760 hours. HbA1C: No results for input(s): "HGBA1C" in the last 72 hours.  CBG: Recent Labs  Lab 09/16/22 0748 09/16/22 1130 09/16/22 1644 09/16/22 2014 09/17/22 0805  GLUCAP 141* 204* 266* 273* 163*   Lipid Profile: No results for input(s): "CHOL", "HDL", "LDLCALC", "TRIG", "CHOLHDL", "LDLDIRECT" in the last 72 hours. Thyroid Function Tests: No results for input(s): "TSH", "T4TOTAL", "FREET4", "T3FREE", "THYROIDAB" in the last 72 hours. Anemia Panel: No results for input(s): "VITAMINB12", "FOLATE", "FERRITIN", "TIBC", "IRON", "RETICCTPCT" in the last 72 hours. Sepsis Labs: No results for input(s): "PROCALCITON", "LATICACIDVEN" in the last 168 hours.  No results found for this or any previous visit (from the past 240 hour(s)).       Radiology Studies: DG FEMUR, MIN 2 VIEWS RIGHT  Result Date: 09/16/2022 CLINICAL DATA:  Assess for fracture. Patient underwent ORIF several days ago.  EXAM: RIGHT FEMUR 2 VIEWS COMPARISON:  Operative images, 09/12/2022. FINDINGS: Two screws fixate an intertrochanteric fracture, supported by a long intramedullary rod. Fracture appears well aligned. No other evidence of a fracture. Orthopedic hardware is well seated. There is soft tissue swelling and a small amount of soft tissue air lateral to the right hip and proximal femur, consistent with the expected postoperative change. IMPRESSION: 1. Well-aligned right proximal femur intertrochanteric fracture. Orthopedic hardware is well seated. Electronically Signed   By: Lajean Manes M.D.   On: 09/16/2022 16:22        Scheduled Meds:  ascorbic acid  500 mg Oral Daily   aspirin  81 mg Oral BID   B-complex with vitamin C  1 tablet Oral Daily   cholecalciferol  1,000 Units Oral Once per day on Mon Wed Fri   docusate sodium  100 mg Oral BID   feeding supplement (GLUCERNA SHAKE)  237 mL Oral TID BM   furosemide  10 mg Oral  q AM   insulin aspart  0-5 Units Subcutaneous QHS   insulin aspart  0-9 Units Subcutaneous TID WC   insulin glargine-yfgn  5 Units Subcutaneous QHS   magnesium oxide  200 mg Oral Daily   metFORMIN  500 mg Oral Q breakfast   potassium chloride  5 mEq Oral Daily   ramipril  2.5 mg Oral Daily   zinc sulfate  220 mg Oral Daily   Continuous Infusions:  methocarbamol (ROBAXIN) IV       LOS: 5 days    Time spent: 35 minutes    Barb Merino, MD [facility] Pager [phone]

## 2022-09-17 NOTE — Inpatient Diabetes Management (Signed)
Inpatient Diabetes Program Recommendations  AACE/ADA: New Consensus Statement on Inpatient Glycemic Control (2015)  Target Ranges:  Prepandial:   less than 140 mg/dL      Peak postprandial:   less than 180 mg/dL (1-2 hours)      Critically ill patients:  140 - 180 mg/dL   Lab Results  Component Value Date   GLUCAP 163 (H) 09/17/2022   HGBA1C 9.8 (H) 09/12/2022    Review of Glycemic Control  Latest Reference Range & Units 09/15/22 21:02 09/16/22 07:48 09/16/22 11:30 09/16/22 16:44 09/16/22 20:14 09/17/22 08:05  Glucose-Capillary 70 - 99 mg/dL 169 (H) 141 (H) 204 (H) 266 (H) 273 (H) 163 (H)  (H): Data is abnormally high Diabetes history: Type 2 DM Outpatient Diabetes medications: Metformin 500 mg QD, Lantus 5 units QA Current orders for Inpatient glycemic control: Novolog 0-9 units & HS, Semglee 5 units QHS, Metformin 500 mg QD  Inpatient Diabetes Program Recommendations:    If patient to remain inpatient, consider adding Novolog 3 units TID (assuming patient consuming >50% of meals) and changing diet to carb modified (if appropriate).  Thanks, Bronson Curb, MSN, RNC-OB Diabetes Coordinator [phone] (8a-5p)

## 2022-09-17 NOTE — Plan of Care (Signed)
  Problem: Education: Goal: Knowledge of General Education information will improve Description: Including pain rating scale, medication(s)/side effects and non-pharmacologic comfort measures Outcome: Progressing   Problem: Health Behavior/Discharge Planning: Goal: Ability to manage health-related needs will improve Outcome: Progressing   Problem: Clinical Measurements: Goal: Ability to maintain clinical measurements within normal limits will improve Outcome: Progressing Goal: Will remain free from infection Outcome: Progressing Goal: Diagnostic test results will improve Outcome: Progressing Goal: Respiratory complications will improve Outcome: Progressing Goal: Cardiovascular complication will be avoided Outcome: Progressing   Problem: Activity: Goal: Risk for activity intolerance will decrease Outcome: Progressing   Problem: Nutrition: Goal: Adequate nutrition will be maintained Outcome: Progressing   Problem: Coping: Goal: Level of anxiety will decrease Outcome: Progressing   Problem: Elimination: Goal: Will not experience complications related to bowel motility Outcome: Progressing Goal: Will not experience complications related to urinary retention Outcome: Progressing   Problem: Pain Managment: Goal: General experience of comfort will improve Outcome: Progressing   Problem: Safety: Goal: Ability to remain free from injury will improve Outcome: Progressing   Problem: Skin Integrity: Goal: Risk for impaired skin integrity will decrease Outcome: Progressing   Problem: Education: Goal: Ability to describe self-care measures that may prevent or decrease complications (Diabetes Survival Skills Education) will improve Outcome: Progressing Goal: Individualized Educational Video(s) Outcome: Progressing   Problem: Coping: Goal: Ability to adjust to condition or change in health will improve Outcome: Progressing   Problem: Fluid Volume: Goal: Ability to  maintain a balanced intake and output will improve Outcome: Progressing   Problem: Health Behavior/Discharge Planning: Goal: Ability to identify and utilize available resources and services will improve Outcome: Progressing Goal: Ability to manage health-related needs will improve Outcome: Progressing   Problem: Metabolic: Goal: Ability to maintain appropriate glucose levels will improve Outcome: Progressing   Problem: Nutritional: Goal: Maintenance of adequate nutrition will improve Outcome: Progressing Goal: Progress toward achieving an optimal weight will improve Outcome: Progressing   Problem: Skin Integrity: Goal: Risk for impaired skin integrity will decrease Outcome: Progressing   Problem: Tissue Perfusion: Goal: Adequacy of tissue perfusion will improve Outcome: Progressing   Problem: Education: Goal: Verbalization of understanding the information provided (i.e., activity precautions, restrictions, etc) will improve Outcome: Progressing Goal: Individualized Educational Video(s) Outcome: Progressing   Problem: Activity: Goal: Ability to ambulate and perform ADLs will improve Outcome: Progressing   Problem: Clinical Measurements: Goal: Postoperative complications will be avoided or minimized Outcome: Progressing   Problem: Self-Concept: Goal: Ability to maintain and perform role responsibilities to the fullest extent possible will improve Outcome: Progressing   Problem: Pain Management: Goal: Pain level will decrease Outcome: Progressing   

## 2022-09-17 NOTE — Progress Notes (Addendum)
This CSW was contacted by [facility] and requested to speak with [place]. This CSW informed that [place] does not come in until 3 PM. The operator asked if this CSW could assist with verifying the pt's DOB and Subscriber # for [employer] This CSW verified the information. [employer] representative moved forward with stating "I don't have a lot of information. He is not skillable right now because I'm missing a lot. Who can help me with that?" This CSW informed her that Ailene Ravel, CSW, is assigned and asked for a moment to verify her contact #.  This CSW verified contact information and the representative stated "So now I have to contact someone else. You can't transfer me?" This CSW informed the representative that this was a work cell phone and that she would need to contact [facility] directly. Ailene Ravel was receiving the rep's call as I was informing her to expect the call.

## 2022-09-18 DIAGNOSIS — D649 Anemia, unspecified: Secondary | ICD-10-CM

## 2022-09-18 DIAGNOSIS — I5032 Chronic diastolic (congestive) heart failure: Secondary | ICD-10-CM | POA: Diagnosis not present

## 2022-09-18 DIAGNOSIS — S72001A Fracture of unspecified part of neck of right femur, initial encounter for closed fracture: Secondary | ICD-10-CM | POA: Diagnosis not present

## 2022-09-18 DIAGNOSIS — G309 Alzheimer's disease, unspecified: Secondary | ICD-10-CM

## 2022-09-18 DIAGNOSIS — E876 Hypokalemia: Secondary | ICD-10-CM

## 2022-09-18 DIAGNOSIS — I442 Atrioventricular block, complete: Secondary | ICD-10-CM | POA: Diagnosis not present

## 2022-09-18 DIAGNOSIS — I10 Essential (primary) hypertension: Secondary | ICD-10-CM

## 2022-09-18 DIAGNOSIS — E0800 Diabetes mellitus due to underlying condition with hyperosmolarity without nonketotic hyperglycemic-hyperosmolar coma (NKHHC): Secondary | ICD-10-CM

## 2022-09-18 DIAGNOSIS — F0280 Dementia in other diseases classified elsewhere without behavioral disturbance: Secondary | ICD-10-CM

## 2022-09-18 DIAGNOSIS — E43 Unspecified severe protein-calorie malnutrition: Secondary | ICD-10-CM

## 2022-09-18 LAB — GLUCOSE, CAPILLARY
Glucose-Capillary: 126 mg/dL — ABNORMAL HIGH (ref 70–99)
Glucose-Capillary: 229 mg/dL — ABNORMAL HIGH (ref 70–99)
Glucose-Capillary: 325 mg/dL — ABNORMAL HIGH (ref 70–99)
Glucose-Capillary: 247 mg/dL — ABNORMAL HIGH (ref 70–99)

## 2022-09-18 MED ORDER — HALOPERIDOL 0.5 MG PO TABS: 0.5000 mg | ORAL_TABLET | Freq: Every evening | ORAL | Status: DC | PRN

## 2022-09-18 NOTE — Progress Notes (Signed)
PROGRESS NOTE    Arhaan Macewan  VQQ:[id number] DOB: Aug 02, 1927 DOA: 09/12/2022 PCP: Georgann Housekeeper, MD    Brief Narrative:  88 years old male with chronic diastolic heart failure, type 2 diabetes, pulmonary arterial hypertension, hyperlipidemia, dementia presented to hospital after sustaining a fall and right hip pain which was thought to be mechanical.  Patient was recently getting work-up for heart block.  In the ED, patient was noted to have right hip fracture and underwent open reduction and internal fixation with IM nail.  Postoperatively, patient developed confusion and delirium which was intermittent and fluctuating.   Assessment & Plan:   Closed traumatic right hip fracture: Status post ORIF with IM nail by Dr. August Saucer 10/2.  Plan is to continue 81 mg of aspirin twice a day for DVT prophylaxis, due to also weightbearing of the right lower extremity.  PT OT has seen the patient and recommended skilled nursing facility placement.   Junctional rhythm: Seen by cardiology during hospitalization.  Plan is to follow-up with electrophysiology as outpatient.  Chronic diastolic heart failure:  Euvolemic.  Plan is to resume Lasix on discharge  Type 2 diabetes mellitus.  Hemoglobin A1c of 9.8.  Continue metformin Levemir sliding scale insulin.    Essential hypertension: On ramipril.  Blood pressure has remained stable.  Postop delirium in a patient with dementia: Avoiding atypical antipsychotics due to nocturnal bradycardia.  Received Ativan for delirium.  Has been started on low-dose Haldol oral and IM.    Much improved today.   DVT prophylaxis: SCDs Start: 09/13/22 0132 Place TED hose Start: 09/13/22 0132 SCDs Start: 09/12/22 1329   Code Status: DNR.  Family Communication:  Daughter at the bedside  Disposition Plan: Skilled nursing facility when bed is available  Status is: Inpatient  Remains inpatient appropriate because: Awaiting for skilled nursing facility.   Medically stable for disposition   Consultants:  Orthopedics Cardiology  Procedures:  ORIF and IM nail right hip  Antimicrobials:  Perioperative cefazolin   Subjective:  Today, patient was seen and examined at bedside.  Patient's family at bedside.  More alert awake and communicative.  Not confused or delirious today.   Objective: Vitals:   09/17/22 1334 09/17/22 1940 09/18/22 0425 09/18/22 0900  BP: (!) 135/118 (!) 105/94 139/63 (!) 136/50  Pulse: 62 65 (!) 58 (!) 53  Resp: 20 16 18  (!) 22  Temp: 98.1 F (36.7 C) 98.4 F (36.9 C) 98.1 F (36.7 C) 98.7 F (37.1 C)  TempSrc: Oral Oral Oral Axillary  SpO2: 93% 97%  97%  Weight:      Height:       No intake or output data in the 24 hours ending 09/18/22 0937  Filed Weights   09/16/22 1943  Weight: 56.7 kg    Examination: Body mass index is 19.01 kg/m.   General:  Average built, not in obvious distress HENT:   No scleral pallor or icterus noted. Oral mucosa is moist.  Chest:  Clear breath sounds.  Diminished breath sounds bilaterally. No crackles or wheezes.  CVS: S1 &S2 heard. No murmur.  Regular rate and rhythm. Abdomen: Soft, nontender, nondistended.  Bowel sounds are heard.   Extremities: No cyanosis, clubbing or edema.  Peripheral pulses are palpable. Psych: Alert, awake and Communicative, mildly anxious CNS:  No cranial nerve deficits.  Power equal in all extremities.,  Tremulous. Skin: Warm and dry.  No rashes noted.  Right lateral thigh incision clean and dry   Data Reviewed: I have personally  reviewed following labs and imaging studies  CBC: Recent Labs  Lab 09/12/22 0932 09/13/22 0450 09/17/22 1156  WBC 9.7 12.2* 8.2  NEUTROABS 7.7  --  5.8  HGB 10.0* 10.1* 8.4*  HCT 30.3* 31.1* 25.7*  MCV 93.8 94.2 94.5  PLT 198 230 [id number]    Basic Metabolic Panel: Recent Labs  Lab 09/12/22 0932 09/12/22 0945 09/13/22 0450 09/17/22 1156  NA 134*  --  134* 136  K 3.4*  --  4.1 3.7  CL 104  --  102  104  CO2 27  --  23 26  GLUCOSE 219*  --  274* 192*  BUN 21  --  22 30*  CREATININE 1.09  --  1.10 1.25*  CALCIUM 8.0*  --  8.1* 7.6*  MG  --  2.0  --  1.8  PHOS  --   --   --  2.9    GFR: Estimated Creatinine Clearance: 30.2 mL/min (A) (by C-G formula based on SCr of 1.25 mg/dL (H)). Liver Function Tests: Recent Labs  Lab 09/12/22 1259  ALBUMIN 2.5*    No results for input(s): "LIPASE", "AMYLASE" in the last 168 hours. No results for input(s): "AMMONIA" in the last 168 hours. Coagulation Profile: Recent Labs  Lab 09/12/22 0932  INR 1.2    Cardiac Enzymes: No results for input(s): "CKTOTAL", "CKMB", "CKMBINDEX", "TROPONINI" in the last 168 hours. BNP (last 3 results) No results for input(s): "PROBNP" in the last 8760 hours. HbA1C: No results for input(s): "HGBA1C" in the last 72 hours.  CBG: Recent Labs  Lab 09/17/22 0805 09/17/22 1210 09/17/22 1618 09/17/22 2115 09/18/22 0751  GLUCAP 163* 173* 254* 203* 126*    Lipid Profile: No results for input(s): "CHOL", "HDL", "LDLCALC", "TRIG", "CHOLHDL", "LDLDIRECT" in the last 72 hours. Thyroid Function Tests: No results for input(s): "TSH", "T4TOTAL", "FREET4", "T3FREE", "THYROIDAB" in the last 72 hours. Anemia Panel: No results for input(s): "VITAMINB12", "FOLATE", "FERRITIN", "TIBC", "IRON", "RETICCTPCT" in the last 72 hours. Sepsis Labs: No results for input(s): "PROCALCITON", "LATICACIDVEN" in the last 168 hours.  No results found for this or any previous visit (from the past 240 hour(s)).   Radiology Studies: DG FEMUR, MIN 2 VIEWS RIGHT  Result Date: 09/16/2022 CLINICAL DATA:  Assess for fracture. Patient underwent ORIF several days ago. EXAM: RIGHT FEMUR 2 VIEWS COMPARISON:  Operative images, 09/12/2022. FINDINGS: Two screws fixate an intertrochanteric fracture, supported by a long intramedullary rod. Fracture appears well aligned. No other evidence of a fracture. Orthopedic hardware is well seated. There is  soft tissue swelling and a small amount of soft tissue air lateral to the right hip and proximal femur, consistent with the expected postoperative change. IMPRESSION: 1. Well-aligned right proximal femur intertrochanteric fracture. Orthopedic hardware is well seated. Electronically Signed   By: Lajean Manes M.D.   On: 09/16/2022 16:22    Scheduled Meds:  ascorbic acid  500 mg Oral Daily   aspirin  81 mg Oral BID   B-complex with vitamin C  1 tablet Oral Daily   cholecalciferol  1,000 Units Oral Once per day on Mon Wed Fri   docusate sodium  100 mg Oral BID   feeding supplement (GLUCERNA SHAKE)  237 mL Oral TID BM   furosemide  10 mg Oral q AM   insulin aspart  0-5 Units Subcutaneous QHS   insulin aspart  0-9 Units Subcutaneous TID WC   insulin glargine-yfgn  5 Units Subcutaneous QHS   magnesium oxide  200 mg  Oral Daily   metFORMIN  500 mg Oral Q breakfast   potassium chloride  5 mEq Oral Daily   ramipril  2.5 mg Oral Daily   zinc sulfate  220 mg Oral Daily   Continuous Infusions:  methocarbamol (ROBAXIN) IV       LOS: 6 days    Flora Lipps, MD [facility]

## 2022-09-18 NOTE — TOC Progression Note (Signed)
Transition of Care [facility]) - Progression Note    Patient Details  Name: Dysen Edmondson MRN: [id number] Date of Birth: 10-21-1927  [facility] CM/SW Contact  Ross Ludwig, [place] Phone Number: 09/18/2022, 12:42 PM  Clinical Narrative:     CSW attempted to contact [facility] admissions worker, left a message on voice mail.  SNF unable to accept till Monday.  Expected Discharge Plan: [place] Barriers to Discharge: No Barriers Identified  Expected Discharge Plan and Services Expected Discharge Plan: [place] In-house Referral: NA Discharge Planning Services: CM Consult Post Acute Care Choice: [place] Living arrangements for the past 2 months: Single Family Home Expected Discharge Date: 09/18/22               DME Arranged: N/A DME Agency: NA                   Social Determinants of Health (SDOH) Interventions Housing Interventions: Intervention Not Indicated  Readmission Risk Interventions    09/15/2022   10:45 AM  Readmission Risk Prevention Plan  Transportation Screening Complete  PCP or Specialist Appt within 5-7 Days Complete  Home Care Screening Complete  Medication Review (RN CM) Complete

## 2022-09-18 NOTE — Progress Notes (Signed)
I attest to student documentation.   Henessy Rohrer V. Dorene Bruni, MSN-RN, CFPN Nursing Adjunct Faculty [employer] 

## 2022-09-19 DIAGNOSIS — S72001A Fracture of unspecified part of neck of right femur, initial encounter for closed fracture: Secondary | ICD-10-CM | POA: Diagnosis not present

## 2022-09-19 DIAGNOSIS — I442 Atrioventricular block, complete: Secondary | ICD-10-CM | POA: Diagnosis not present

## 2022-09-19 DIAGNOSIS — I5032 Chronic diastolic (congestive) heart failure: Secondary | ICD-10-CM | POA: Diagnosis not present

## 2022-09-19 DIAGNOSIS — G309 Alzheimer's disease, unspecified: Secondary | ICD-10-CM | POA: Diagnosis not present

## 2022-09-19 LAB — GLUCOSE, CAPILLARY
Glucose-Capillary: 142 mg/dL — ABNORMAL HIGH (ref 70–99)
Glucose-Capillary: 326 mg/dL — ABNORMAL HIGH (ref 70–99)
Glucose-Capillary: 255 mg/dL — ABNORMAL HIGH (ref 70–99)
Glucose-Capillary: 296 mg/dL — ABNORMAL HIGH (ref 70–99)

## 2022-09-19 NOTE — Progress Notes (Signed)
PROGRESS NOTE    David Cervantes  XLK:[id number] DOB: October 30, 1927 DOA: 09/12/2022 PCP: [name], MD    Brief Narrative:  88 years old male with chronic diastolic heart failure, type 2 diabetes, pulmonary arterial hypertension, hyperlipidemia, dementia presented to hospital after sustaining a fall and right hip pain which was thought to be mechanical.  Patient was recently getting work-up for heart block.  In the ED, patient was noted to have right hip fracture and underwent open reduction and internal fixation with IM nail.  Postoperatively, patient developed confusion and delirium which was intermittent and fluctuating.  At this time patient has remained stable awaiting for skilled nursing facility.  Assessment & Plan:   Principal Problem:   Closed right hip fracture (HCC) Active Problems:   Dementia ([place])   DM (diabetes mellitus) ([place])   HTN (hypertension)   Chronic diastolic CHF (congestive heart failure) (HCC)   Complete heart block (HCC)   Hypokalemia   Normocytic anemia   Protein-calorie malnutrition, severe   Closed traumatic right hip fracture: Status post ORIF with IM nail by Dr. Marlou Sa 10/2.  Plan is to continue 81 mg of aspirin twice a day for DVT prophylaxis, due to also weightbearing of the right lower extremity.  PT OT has seen the patient and recommended skilled nursing facility placement.  Need to follow-up with orthopedics as outpatient.  Junctional rhythm: Seen by cardiology during hospitalization.  Plan is to follow-up with electrophysiology as outpatient.  Chronic diastolic heart failure:  Euvolemic.  Plan is to resume Lasix on discharge  Type 2 diabetes mellitus.  Hemoglobin A1c of 9.8.  Continue metformin, Levemir sliding scale insulin.    Essential hypertension: On ramipril.  Continue for now.  Postop delirium in a patient with dementia: Avoiding atypical antipsychotics due to nocturnal bradycardia.  Has not required Haldol for sedation.   Currently stable.   DVT prophylaxis: SCDs Start: 09/13/22 0132 Place TED hose Start: 09/13/22 0132 SCDs Start: 09/12/22 1329   Code Status: DNR.  Family Communication:  I again spoke with the patient's daughter at the bedside  Disposition Plan: Skilled nursing facility when bed is available  Status is: Inpatient  Remains inpatient appropriate because: Awaiting for skilled nursing facility.  Medically stable for disposition   Consultants:  Orthopedics Cardiology  Procedures:  ORIF and IM nail right hip on 09/13/2022  Antimicrobials:  None  Subjective:  Today, patient was seen and examined at bedside.  Daughter at bedside.  Patient is more alert awake Communicative.  As per the daughter was not confused.  Awaiting for nursing home placement.     Objective: Vitals:   09/18/22 0900 09/18/22 1352 09/18/22 1949 09/19/22 0456  BP: (!) 136/50 (!) 116/50 (!) 131/51 (!) 134/54  Pulse: (!) 53 64 62 (!) 51  Resp: (!) 22 17 18 18   Temp: 98.7 F (37.1 C) 98.6 F (37 C) 98.9 F (37.2 C) 97.8 F (36.6 C)  TempSrc: Axillary Oral Oral Oral  SpO2: 97% 95% 96% 95%  Weight:      Height:        Intake/Output Summary (Last 24 hours) at 09/19/2022 1339 Last data filed at 09/19/2022 1100 Gross per 24 hour  Intake 630 ml  Output 1100 ml  Net -470 ml    Filed Weights   09/16/22 1943  Weight: 56.7 kg    Examination: Body mass index is 19.01 kg/m.   General:  Average built, not in obvious distress, thinly built, Communicative HENT:   No scleral  pallor or icterus noted. Oral mucosa is moist.  Chest:  Clear breath sounds.  Diminished breath sounds bilaterally. No crackles or wheezes.  CVS: S1 &S2 heard. No murmur.  Regular rate and rhythm. Abdomen: Soft, nontender, nondistended.  Bowel sounds are heard.   Extremities: No cyanosis, clubbing or edema.  Peripheral pulses are palpable. Psych: Alert, awake and Communicative, CNS:  No cranial nerve deficits.  Moves all  extremities.  s. Skin: Warm and dry.  Right lateral thigh incision clean and dry.  Data Reviewed: I have personally reviewed following labs and imaging studies  CBC: Recent Labs  Lab 09/13/22 0450 09/17/22 1156  WBC 12.2* 8.2  NEUTROABS  --  5.8  HGB 10.1* 8.4*  HCT 31.1* 25.7*  MCV 94.2 94.5  PLT 230 [id number]    Basic Metabolic Panel: Recent Labs  Lab 09/13/22 0450 09/17/22 1156  NA 134* 136  K 4.1 3.7  CL 102 104  CO2 23 26  GLUCOSE 274* 192*  BUN 22 30*  CREATININE 1.10 1.25*  CALCIUM 8.1* 7.6*  MG  --  1.8  PHOS  --  2.9    GFR: Estimated Creatinine Clearance: 30.2 mL/min (A) (by C-G formula based on SCr of 1.25 mg/dL (H)). Liver Function Tests: No results for input(s): "AST", "ALT", "ALKPHOS", "BILITOT", "PROT", "ALBUMIN" in the last 168 hours.  No results for input(s): "LIPASE", "AMYLASE" in the last 168 hours. No results for input(s): "AMMONIA" in the last 168 hours. Coagulation Profile: No results for input(s): "INR", "PROTIME" in the last 168 hours.  Cardiac Enzymes: No results for input(s): "CKTOTAL", "CKMB", "CKMBINDEX", "TROPONINI" in the last 168 hours. BNP (last 3 results) No results for input(s): "PROBNP" in the last 8760 hours. HbA1C: No results for input(s): "HGBA1C" in the last 72 hours.  CBG: Recent Labs  Lab 09/18/22 1132 09/18/22 1655 09/18/22 2007 09/19/22 0727 09/19/22 1205  GLUCAP 229* 325* 247* 142* 326*    Lipid Profile: No results for input(s): "CHOL", "HDL", "LDLCALC", "TRIG", "CHOLHDL", "LDLDIRECT" in the last 72 hours. Thyroid Function Tests: No results for input(s): "TSH", "T4TOTAL", "FREET4", "T3FREE", "THYROIDAB" in the last 72 hours. Anemia Panel: No results for input(s): "VITAMINB12", "FOLATE", "FERRITIN", "TIBC", "IRON", "RETICCTPCT" in the last 72 hours. Sepsis Labs: No results for input(s): "PROCALCITON", "LATICACIDVEN" in the last 168 hours.  No results found for this or any previous visit (from the past 240  hour(s)).   Radiology Studies: No results found.  Scheduled Meds:  ascorbic acid  500 mg Oral Daily   aspirin  81 mg Oral BID   B-complex with vitamin C  1 tablet Oral Daily   cholecalciferol  1,000 Units Oral Once per day on Mon Wed Fri   docusate sodium  100 mg Oral BID   feeding supplement (GLUCERNA SHAKE)  237 mL Oral TID BM   furosemide  10 mg Oral q AM   insulin aspart  0-5 Units Subcutaneous QHS   insulin aspart  0-9 Units Subcutaneous TID WC   insulin glargine-yfgn  5 Units Subcutaneous QHS   magnesium oxide  200 mg Oral Daily   metFORMIN  500 mg Oral Q breakfast   potassium chloride  5 mEq Oral Daily   ramipril  2.5 mg Oral Daily   zinc sulfate  220 mg Oral Daily   Continuous Infusions:  methocarbamol (ROBAXIN) IV       LOS: 7 days    Flora Lipps, MD [facility]

## 2022-09-20 ENCOUNTER — Encounter (HOSPITAL_COMMUNITY): Payer: Self-pay | Admitting: Internal Medicine

## 2022-09-20 DIAGNOSIS — R0902 Hypoxemia: Secondary | ICD-10-CM | POA: Diagnosis present

## 2022-09-20 DIAGNOSIS — N179 Acute kidney failure, unspecified: Secondary | ICD-10-CM | POA: Diagnosis not present

## 2022-09-20 DIAGNOSIS — G9341 Metabolic encephalopathy: Secondary | ICD-10-CM | POA: Diagnosis present

## 2022-09-20 DIAGNOSIS — R262 Difficulty in walking, not elsewhere classified: Secondary | ICD-10-CM | POA: Diagnosis not present

## 2022-09-20 DIAGNOSIS — E0800 Diabetes mellitus due to underlying condition with hyperosmolarity without nonketotic hyperglycemic-hyperosmolar coma (NKHHC): Secondary | ICD-10-CM | POA: Diagnosis not present

## 2022-09-20 DIAGNOSIS — E118 Type 2 diabetes mellitus with unspecified complications: Secondary | ICD-10-CM | POA: Diagnosis not present

## 2022-09-20 DIAGNOSIS — E119 Type 2 diabetes mellitus without complications: Secondary | ICD-10-CM | POA: Diagnosis not present

## 2022-09-20 DIAGNOSIS — E8809 Other disorders of plasma-protein metabolism, not elsewhere classified: Secondary | ICD-10-CM | POA: Diagnosis present

## 2022-09-20 DIAGNOSIS — E162 Hypoglycemia, unspecified: Secondary | ICD-10-CM | POA: Diagnosis not present

## 2022-09-20 DIAGNOSIS — R1312 Dysphagia, oropharyngeal phase: Secondary | ICD-10-CM | POA: Diagnosis not present

## 2022-09-20 DIAGNOSIS — Z711 Person with feared health complaint in whom no diagnosis is made: Secondary | ICD-10-CM | POA: Diagnosis not present

## 2022-09-20 DIAGNOSIS — I272 Pulmonary hypertension, unspecified: Secondary | ICD-10-CM | POA: Diagnosis present

## 2022-09-20 DIAGNOSIS — G309 Alzheimer's disease, unspecified: Secondary | ICD-10-CM | POA: Diagnosis not present

## 2022-09-20 DIAGNOSIS — S72141S Displaced intertrochanteric fracture of right femur, sequela: Secondary | ICD-10-CM | POA: Diagnosis not present

## 2022-09-20 DIAGNOSIS — W19XXXA Unspecified fall, initial encounter: Secondary | ICD-10-CM | POA: Diagnosis not present

## 2022-09-20 DIAGNOSIS — I110 Hypertensive heart disease with heart failure: Secondary | ICD-10-CM | POA: Diagnosis present

## 2022-09-20 DIAGNOSIS — I491 Atrial premature depolarization: Secondary | ICD-10-CM | POA: Diagnosis not present

## 2022-09-20 DIAGNOSIS — R6889 Other general symptoms and signs: Secondary | ICD-10-CM | POA: Diagnosis not present

## 2022-09-20 DIAGNOSIS — S72141D Displaced intertrochanteric fracture of right femur, subsequent encounter for closed fracture with routine healing: Secondary | ICD-10-CM | POA: Diagnosis not present

## 2022-09-20 DIAGNOSIS — I5032 Chronic diastolic (congestive) heart failure: Secondary | ICD-10-CM | POA: Diagnosis not present

## 2022-09-20 DIAGNOSIS — Z7189 Other specified counseling: Secondary | ICD-10-CM | POA: Diagnosis not present

## 2022-09-20 DIAGNOSIS — E43 Unspecified severe protein-calorie malnutrition: Secondary | ICD-10-CM | POA: Diagnosis present

## 2022-09-20 DIAGNOSIS — Z789 Other specified health status: Secondary | ICD-10-CM | POA: Diagnosis not present

## 2022-09-20 DIAGNOSIS — M6281 Muscle weakness (generalized): Secondary | ICD-10-CM | POA: Diagnosis not present

## 2022-09-20 DIAGNOSIS — E161 Other hypoglycemia: Secondary | ICD-10-CM | POA: Diagnosis not present

## 2022-09-20 DIAGNOSIS — E1165 Type 2 diabetes mellitus with hyperglycemia: Secondary | ICD-10-CM | POA: Diagnosis present

## 2022-09-20 DIAGNOSIS — I5033 Acute on chronic diastolic (congestive) heart failure: Secondary | ICD-10-CM | POA: Diagnosis present

## 2022-09-20 DIAGNOSIS — R531 Weakness: Secondary | ICD-10-CM | POA: Diagnosis present

## 2022-09-20 DIAGNOSIS — Z9181 History of falling: Secondary | ICD-10-CM | POA: Diagnosis not present

## 2022-09-20 DIAGNOSIS — E876 Hypokalemia: Secondary | ICD-10-CM | POA: Diagnosis present

## 2022-09-20 DIAGNOSIS — R4182 Altered mental status, unspecified: Secondary | ICD-10-CM | POA: Diagnosis not present

## 2022-09-20 DIAGNOSIS — M2550 Pain in unspecified joint: Secondary | ICD-10-CM | POA: Diagnosis not present

## 2022-09-20 DIAGNOSIS — Z4789 Encounter for other orthopedic aftercare: Secondary | ICD-10-CM | POA: Diagnosis not present

## 2022-09-20 DIAGNOSIS — R55 Syncope and collapse: Secondary | ICD-10-CM | POA: Diagnosis not present

## 2022-09-20 DIAGNOSIS — I5043 Acute on chronic combined systolic (congestive) and diastolic (congestive) heart failure: Secondary | ICD-10-CM | POA: Diagnosis not present

## 2022-09-20 DIAGNOSIS — S72001A Fracture of unspecified part of neck of right femur, initial encounter for closed fracture: Secondary | ICD-10-CM | POA: Diagnosis not present

## 2022-09-20 DIAGNOSIS — Z515 Encounter for palliative care: Secondary | ICD-10-CM | POA: Diagnosis not present

## 2022-09-20 DIAGNOSIS — Z681 Body mass index (BMI) 19 or less, adult: Secondary | ICD-10-CM | POA: Diagnosis not present

## 2022-09-20 DIAGNOSIS — T502X5A Adverse effect of carbonic-anhydrase inhibitors, benzothiadiazides and other diuretics, initial encounter: Secondary | ICD-10-CM | POA: Diagnosis present

## 2022-09-20 DIAGNOSIS — F0390 Unspecified dementia without behavioral disturbance: Secondary | ICD-10-CM | POA: Diagnosis present

## 2022-09-20 DIAGNOSIS — R638 Other symptoms and signs concerning food and fluid intake: Secondary | ICD-10-CM | POA: Diagnosis not present

## 2022-09-20 DIAGNOSIS — M25551 Pain in right hip: Secondary | ICD-10-CM | POA: Diagnosis not present

## 2022-09-20 DIAGNOSIS — R2681 Unsteadiness on feet: Secondary | ICD-10-CM | POA: Diagnosis not present

## 2022-09-20 DIAGNOSIS — F0280 Dementia in other diseases classified elsewhere without behavioral disturbance: Secondary | ICD-10-CM | POA: Diagnosis not present

## 2022-09-20 DIAGNOSIS — I4891 Unspecified atrial fibrillation: Secondary | ICD-10-CM | POA: Diagnosis not present

## 2022-09-20 DIAGNOSIS — E11649 Type 2 diabetes mellitus with hypoglycemia without coma: Secondary | ICD-10-CM | POA: Diagnosis present

## 2022-09-20 DIAGNOSIS — I442 Atrioventricular block, complete: Secondary | ICD-10-CM | POA: Diagnosis present

## 2022-09-20 DIAGNOSIS — I10 Essential (primary) hypertension: Secondary | ICD-10-CM | POA: Diagnosis not present

## 2022-09-20 DIAGNOSIS — Z66 Do not resuscitate: Secondary | ICD-10-CM | POA: Diagnosis not present

## 2022-09-20 DIAGNOSIS — Z743 Need for continuous supervision: Secondary | ICD-10-CM | POA: Diagnosis not present

## 2022-09-20 DIAGNOSIS — Z7401 Bed confinement status: Secondary | ICD-10-CM | POA: Diagnosis not present

## 2022-09-20 DIAGNOSIS — Z8616 Personal history of COVID-19: Secondary | ICD-10-CM | POA: Diagnosis not present

## 2022-09-20 DIAGNOSIS — F5105 Insomnia due to other mental disorder: Secondary | ICD-10-CM | POA: Diagnosis not present

## 2022-09-20 DIAGNOSIS — E782 Mixed hyperlipidemia: Secondary | ICD-10-CM | POA: Diagnosis present

## 2022-09-20 DIAGNOSIS — Z794 Long term (current) use of insulin: Secondary | ICD-10-CM | POA: Diagnosis not present

## 2022-09-20 LAB — GLUCOSE, CAPILLARY
Glucose-Capillary: 171 mg/dL — ABNORMAL HIGH (ref 70–99)
Glucose-Capillary: 366 mg/dL — ABNORMAL HIGH (ref 70–99)

## 2022-09-20 NOTE — Progress Notes (Signed)
Physical Therapy Treatment Patient Details Name: David Cervantes MRN: [id number] DOB: 02/01/1927 Today's Date: 09/20/2022   History of Present Illness 88 y.o. male with chronic diastolic heart failure, type 2 diabetes, pulmonary arterial hypertension, hyperlipidemia, dementia from home with daughter presented with right hip pain after fall. Patient was found to have right hip fracture underwent ORIF with IM nail on 09/13/22.    PT Comments    Patient remains confused but cooperative and agreeable to therapy at start of session. Pt required Max+2/Total assist to pivot to EOB and gain seated balance. Mod +2 assist to rise from elevated surface for sit<>stand with repeated multimodal cues for hand placement on RW and Lt foot placement for power up. Total assist for lateral scoot completed to move bed>chair and pt repositioned pillows. At [place] arrived for transport to facility. Attempted sit to stand transfer to switch chair with stretcher but pt very limited and due to fatigue and discomfort. Pt transferred via total assist side slide with bed pad to move chair to stretcher with Total Assist.    Recommendations for follow up therapy are one component of a multi-disciplinary discharge planning process, led by the attending physician.  Recommendations may be updated based on patient status, additional functional criteria and insurance authorization.  Follow Up Recommendations  Skilled nursing-short term rehab (<3 hours/day) Can patient physically be transported by private vehicle: No   Assistance Recommended at Discharge Frequent or constant Supervision/Assistance  Patient can return home with the following Two people to help with walking and/or transfers;Two people to help with bathing/dressing/bathroom;Assistance with cooking/housework;Assistance with feeding;Direct supervision/assist for financial management;Direct supervision/assist for medications management;Assist for transportation;Help  with stairs or ramp for entrance   Equipment Recommendations  None recommended by PT    Recommendations for Other Services       Precautions / Restrictions Precautions Precautions: Fall Restrictions Weight Bearing Restrictions: Yes RLE Weight Bearing: Non weight bearing Other Position/Activity Restrictions: TDWB for transfer only with RW     Mobility  Bed Mobility Overal bed mobility: Needs Assistance Bed Mobility: Supine to Sit, Sit to Supine     Supine to sit: +2 for safety/equipment, HOB elevated, Mod assist, +2 for physical assistance Sit to supine: Total assist, +2 for physical assistance, +2 for safety/equipment   General bed mobility comments: Total assist to raise trunk and pivot with use of bed pad to move to EOB. pt required assist to gain balance sitting EOB. Total assist with PTAR to transfer reclienr to stretcher via side slide with bed pad.    Transfers Overall transfer level: Needs assistance Equipment used: Rolling walker (2 wheels), 2 person hand held assist Transfers: Sit to/from Stand, Bed to chair/wheelchair/BSC Sit to Stand: Mod assist, +2 physical assistance, +2 safety/equipment, From elevated surface          Lateral/Scoot Transfers: Total assist, +2 physical assistance, +2 safety/equipment, From elevated surface General transfer comment: Mod Assist +2 to rise from EOB to stand with RW. Pt required manual cues/assist to reduce weight on Rt LE and attempt to maintain NWB-TDWB. Pt unable to stand for safe amount of time and lateral scoot transfer completed instead. pt required Total assist for lateral scoot with bed pad to chair.    Ambulation/Gait                   Stairs             Wheelchair Mobility    Modified Rankin (Stroke Patients Only)  Balance Overall balance assessment: History of Falls, Needs assistance Sitting-balance support: Feet supported, Bilateral upper extremity supported Sitting balance-Leahy Scale:  Poor     Standing balance support: Bilateral upper extremity supported, Reliant on assistive device for balance Standing balance-Leahy Scale: Zero                              Cognition Arousal/Alertness: Awake/alert Behavior During Therapy: WFL for tasks assessed/performed Overall Cognitive Status: Impaired/Different from baseline Area of Impairment: Attention, Orientation, Following commands, Memory, Safety/judgement, Awareness, Problem solving                 Orientation Level: Disoriented to, Time, Situation, Place Current Attention Level: Sustained Memory: Decreased recall of precautions, Decreased short-term memory Following Commands: Follows one step commands with increased time, Follows multi-step commands inconsistently, Follows one step commands inconsistently Safety/Judgement: Decreased awareness of safety Awareness: Intellectual Problem Solving: Slow processing, Decreased initiation, Difficulty sequencing, Requires verbal cues, Requires tactile cues          Exercises      General Comments        Pertinent Vitals/Pain Pain Assessment Pain Assessment: Faces Faces Pain Scale: Hurts even more Pain Location: Rt hip with mobility Pain Descriptors / Indicators: Discomfort, Grimacing, Guarding, Moaning Pain Intervention(s): Limited activity within patient's tolerance, Monitored during session, Repositioned    Home Living                          Prior Function            PT Goals (current goals can now be found in the care plan section) Acute Rehab PT Goals Patient Stated Goal: to regain function PT Goal Formulation: With family Time For Goal Achievement: 09/28/22 Potential to Achieve Goals: Fair Progress towards PT goals: Progressing toward goals (slow)    Frequency    Min 2X/week      PT Plan Current plan remains appropriate    Co-evaluation              AM-PAC PT "6 Clicks" Mobility   Outcome Measure  Help  needed turning from your back to your side while in a flat bed without using bedrails?: Total Help needed moving from lying on your back to sitting on the side of a flat bed without using bedrails?: Total Help needed moving to and from a bed to a chair (including a wheelchair)?: Total Help needed standing up from a chair using your arms (e.g., wheelchair or bedside chair)?: Total Help needed to walk in hospital room?: Total Help needed climbing 3-5 steps with a railing? : Total 6 Click Score: 6    End of Session Equipment Utilized During Treatment: Gait belt Activity Tolerance: Patient tolerated treatment well;Patient limited by fatigue Patient left: in bed;with call bell/phone within reach;with bed alarm set;with family/visitor present Nurse Communication: Mobility status;Need for lift equipment PT Visit Diagnosis: Unsteadiness on feet (R26.81);Muscle weakness (generalized) (M62.81);History of falling (Z91.81);Difficulty in walking, not elsewhere classified (R26.2);Pain Pain - Right/Left: Right Pain - part of body: Hip     Time: 9371-6967 PT Time Calculation (min) (ACUTE ONLY): 36 min  Charges:  $Therapeutic Activity: 23-37 mins                     Verner Mould, DPT Acute Rehabilitation Services Office [phone]  09/20/22 3:59 PM

## 2022-09-20 NOTE — Progress Notes (Signed)
Report called to [place] at [place]. Samantha aware PTAR is here to get patient now and will be transporting.

## 2022-09-20 NOTE — TOC Transition Note (Signed)
Transition of Care [facility]) - CM/SW Discharge Note   Patient Details  Name: David Cervantes MRN: [id number] Date of Birth: [birth year]  Transition of Care [facility]) CM/SW Contact:  Vassie Moselle, [place] Phone Number: 09/20/2022, 12:43 PM   Clinical Narrative:    Pt is to transfer to [facility] for SNF placement. Pt will be going to room 116. RN to call report to [phone]. Pt's daughter has been notified and is agreeable to transfer. PTAR called at 12:40pm for transportation.    Final next level of care: Skilled Nursing Facility Barriers to Discharge: No Barriers Identified   Patient Goals and CMS Choice Patient states their goals for this hospitalization and ongoing recovery are:: To transfer to SNF per daughter CMS Medicare.gov Compare Post Acute Care list provided to:: Patient Represenative (must comment) (Daughter) Choice offered to / list presented to : Adult Children  Discharge Placement PASRR number recieved: 09/15/22            Patient chooses bed at: [facility] Patient to be transferred to facility by: [facility] Name of family member notified: [name] Patient and family notified of of transfer: 09/20/22  Discharge Plan and Services In-house Referral: NA Discharge Planning Services: CM Consult Post Acute Care Choice: [place]          DME Arranged: N/A DME Agency: NA                  Social Determinants of Health (SDOH) Interventions Housing Interventions: Intervention Not Indicated   Readmission Risk Interventions    09/20/2022    9:51 AM 09/15/2022   10:45 AM  Readmission Risk Prevention Plan  Transportation Screening Complete Complete  PCP or Specialist Appt within 5-7 Days  Complete  Home Care Screening  Complete  Medication Review (RN CM)  Complete  Medication Review (RN Care Manager) Complete   PCP or Specialist appointment within 3-5 days of discharge Complete   HRI or Home Care Consult Complete   SW Recovery  Care/Counseling Consult Complete   [place] Complete

## 2022-09-20 NOTE — Discharge Summary (Signed)
Physician Discharge Summary  David Cervantes [id number] DOB: 11-04-1927 DOA: 09/12/2022  PCP: [name], MD  Admit date: 09/12/2022 Discharge date: 09/20/2022  Admitted From: Home  Discharge disposition: SNF   Recommendations for Outpatient Follow-Up:   Follow up with your primary care provider at the skilled nursing facility in 3 to 5 days. Check CBC, BMP, magnesium in the next visit Follow-up with orthopedics Dr. Marlou Sa in 1 week. Partial weightbearing for transfers only.  Full weightbearing in one week.  Discharge Diagnosis:   Principal Problem:   Closed right hip fracture (HCC) Active Problems:   Dementia ([place])   DM (diabetes mellitus) ([place])   HTN (hypertension)   Chronic diastolic CHF (congestive heart failure) (HCC)   Complete heart block (HCC)   Hypokalemia   Normocytic anemia   Protein-calorie malnutrition, severe   Discharge Condition: Improved.  Diet recommendation: Low sodium, heart healthy.  Carbohydrate-modified.    Wound care: None.  Code status: DNR   History of Present Illness:   88 years old male with chronic diastolic heart failure, type 2 diabetes, pulmonary arterial hypertension, hyperlipidemia, dementia presented to hospital after sustaining a fall and right hip pain which was thought to be mechanical.  Patient was recently getting work-up for heart block.  In the ED, patient was noted to have right hip fracture and underwent open reduction and internal fixation with IM nail.  Postoperatively, patient developed confusion and delirium which was intermittent and fluctuating which has resolved at this time.  Hospital Course:   Following conditions were addressed during hospitalization as listed below,  Closed traumatic right hip fracture: Status post ORIF with IM nail by Dr. Marlou Sa 09/13/22.  Plan is to continue 81 mg of aspirin twice a day for DVT prophylaxis.  PT OT has seen the patient and recommended skilled nursing facility  placement.  Need to follow-up with orthopedics as outpatient in one week.   Junctional rhythm: Seen by cardiology during hospitalization.  Plan is to follow-up with electrophysiology as outpatient.   Chronic diastolic heart failure:  Euvolemic.  Plan is to resume Lasix on discharge   Type 2 diabetes mellitus.  Hemoglobin A1c of 9.8.  Continue metformin, Levemir    Essential hypertension: On ramipril.  Continue for now.   Postop delirium in a patient with dementia: Resolved at this time  Disposition.  At this time, patient is stable for disposition to skilled nursing facility.  Spoke with the patient's daughter at bedside.  Medical Consultants:   Orthopedics Cardiology  Procedures:    ORIF and IM nail right hip on 09/13/2022 Subjective:   Today, patient was seen and examined at bedside.  Eating well.  No interval complaints.  Patient's daughter at bedside  Discharge Exam:   Vitals:   09/19/22 2005 09/20/22 0527  BP: (!) 137/52 131/84  Pulse: (!) 56 (!) 57  Resp: 20 20  Temp: 97.9 F (36.6 C) 97.6 F (36.4 C)  SpO2: 94% 98%   Vitals:   09/19/22 0456 09/19/22 1311 09/19/22 2005 09/20/22 0527  BP: (!) 134/54 119/71 (!) 137/52 131/84  Pulse: (!) 51 60 (!) 56 (!) 57  Resp: 18 20 20 20   Temp: 97.8 F (36.6 C) 98.3 F (36.8 C) 97.9 F (36.6 C) 97.6 F (36.4 C)  TempSrc: Oral Oral Oral Oral  SpO2: 95% 99% 94% 98%  Weight:      Height:       General: Alert awake, not in obvious distress, thinly built, Communicative, elderly male, HENT:  pupils equally reacting to light,  No scleral pallor or icterus noted. Oral mucosa is moist.  Chest:  Clear breath sounds.  No crackles or wheezes.  CVS: S1 &S2 heard. No murmur.  Regular rate and rhythm. Abdomen: Soft, nontender, nondistended.  Bowel sounds are heard.   Extremities: No cyanosis, clubbing or edema.  Peripheral pulses are palpable. Psych: Alert, awake and Communicative. CNS:  No cranial nerve deficits.  Power equal  in all extremities.   Skin: Warm and dry.  Right thigh incision clean and intact   The results of significant diagnostics from this hospitalization (including imaging, microbiology, ancillary and laboratory) are listed below for reference.     Diagnostic Studies:   DG FEMUR, MIN 2 VIEWS RIGHT  Result Date: 09/13/2022 CLINICAL DATA:  And operative right intramedullary nail, right intertrochanteric hip fracture. EXAM: RIGHT FEMUR 2 VIEWS COMPARISON:  09/12/2022. FINDINGS: Five fluoroscopic images were obtained intraoperatively for placement of fixation hardware for intertrochanteric right femoral fracture. Total fluoroscopy time is 36 seconds. Dose: 5.5 mGy. Please see operative report for additional information. IMPRESSION: Intraoperative utilization of fluoroscopy. Electronically Signed   By: Brett Fairy M.D.   On: 09/13/2022 00:25   DG C-Arm 1-60 Min-No Report  Result Date: 09/13/2022 Fluoroscopy was utilized by the requesting physician.  No radiographic interpretation.   DG Hip Unilat  With Pelvis 2-3 Views Right  Result Date: 09/12/2022 CLINICAL DATA:  Mechanical fall with right hip pain EXAM: DG HIP (WITH OR WITHOUT PELVIS) 2-3V RIGHT COMPARISON:  None Available. FINDINGS: Acute intertrochanteric right femur fracture with varus angulation. Generalized osteopenia. Rectal stool distention. IMPRESSION: 1. Intertrochanteric right femur fracture. 2. Stool distended rectum. Electronically Signed   By: Jorje Guild M.D.   On: 09/12/2022 09:43   DG Chest 1 View  Result Date: 09/12/2022 CLINICAL DATA:  Hip fracture.  Mechanical fall EXAM: CHEST  1 VIEW COMPARISON:  05/20/2022 FINDINGS: Stable heart size and mediastinal contours. Indistinct density behind the heart likely with pleural fluid. No pneumothorax. Benign chondroid matrix in the left humeral shaft. IMPRESSION: Low volume chest with atelectasis or infiltrate at the left base. Electronically Signed   By: Jorje Guild M.D.   On:  09/12/2022 09:28     Labs:   Basic Metabolic Panel: Recent Labs  Lab 09/17/22 1156  NA 136  K 3.7  CL 104  CO2 26  GLUCOSE 192*  BUN 30*  CREATININE 1.25*  CALCIUM 7.6*  MG 1.8  PHOS 2.9   GFR Estimated Creatinine Clearance: 30.2 mL/min (A) (by C-G formula based on SCr of 1.25 mg/dL (H)). Liver Function Tests: No results for input(s): "AST", "ALT", "ALKPHOS", "BILITOT", "PROT", "ALBUMIN" in the last 168 hours. No results for input(s): "LIPASE", "AMYLASE" in the last 168 hours. No results for input(s): "AMMONIA" in the last 168 hours. Coagulation profile No results for input(s): "INR", "PROTIME" in the last 168 hours.  CBC: Recent Labs  Lab 09/17/22 1156  WBC 8.2  NEUTROABS 5.8  HGB 8.4*  HCT 25.7*  MCV 94.5  PLT 309   Cardiac Enzymes: No results for input(s): "CKTOTAL", "CKMB", "CKMBINDEX", "TROPONINI" in the last 168 hours. BNP: Invalid input(s): "POCBNP" CBG: Recent Labs  Lab 09/19/22 0727 09/19/22 1205 09/19/22 1721 09/19/22 2130 09/20/22 0825  GLUCAP 142* 326* 255* 296* 171*   D-Dimer No results for input(s): "DDIMER" in the last 72 hours. Hgb A1c No results for input(s): "HGBA1C" in the last 72 hours. Lipid Profile No results for input(s): "CHOL", "HDL", "LDLCALC", "TRIG", "  CHOLHDL", "LDLDIRECT" in the last 72 hours. Thyroid function studies No results for input(s): "TSH", "T4TOTAL", "T3FREE", "THYROIDAB" in the last 72 hours.  Invalid input(s): "FREET3" Anemia work up No results for input(s): "VITAMINB12", "FOLATE", "FERRITIN", "TIBC", "IRON", "RETICCTPCT" in the last 72 hours. Microbiology No results found for this or any previous visit (from the past 240 hour(s)).   Discharge Instructions:   Discharge Instructions     Call MD for:  redness, tenderness, or signs of infection (pain, swelling, redness, odor or green/yellow discharge around incision site)   Complete by: As directed    Call MD for:  severe uncontrolled pain   Complete  by: As directed    Call MD for:  temperature >100.4   Complete by: As directed    Diet general   Complete by: As directed    Discharge instructions   Complete by: As directed    Weightbearing on the surgical site for transfers only.  Okay to full weightbear in 7 days.  Follow-up with Dr. Marlou Sa orthopedics in 1 week.  Take medications as prescribed.   Discharge wound care:   Complete by: As directed    Reinforce dressing.   Increase activity slowly   Complete by: As directed       Allergies as of 09/20/2022       Reactions   Tape Other (See Comments)   SKIN TEARS AND BRUISES VERY EASILY!!        Medication List     TAKE these medications    acetaminophen 325 MG tablet Commonly known as: TYLENOL Take 1-2 tablets (325-650 mg total) by mouth every 6 (six) hours as needed for mild pain (pain score 1-3 or temp > 100.5).   ascorbic acid 500 MG tablet Commonly known as: VITAMIN C Take 500 mg by mouth daily.   aspirin 81 MG chewable tablet Chew 1 tablet (81 mg total) by mouth 2 (two) times daily.   cholecalciferol 25 MCG (1000 UNIT) tablet Commonly known as: VITAMIN D3 Take 1,000 Units by mouth 3 (three) times a week.   docusate sodium 100 MG capsule Commonly known as: COLACE Take 1 capsule (100 mg total) by mouth 2 (two) times daily.   furosemide 20 MG tablet Commonly known as: LASIX Take 10 mg by mouth in the morning.   Lantus SoloStar 100 UNIT/ML Solostar Pen Generic drug: insulin glargine Inject 8 Units into the skin daily before breakfast. What changed:  how much to take when to take this   magnesium oxide 400 MG tablet Commonly known as: MAG-OX Take 1 tablet (400 mg total) by mouth daily. What changed: how much to take   metFORMIN 500 MG 24 hr tablet Commonly known as: GLUCOPHAGE-XR Take 500 mg by mouth daily with breakfast.   potassium chloride 10 MEQ tablet Commonly known as: KLOR-CON M Take 5 mEq by mouth daily.   ramipril 2.5 MG  capsule Commonly known as: ALTACE Take 2.5 mg by mouth in the morning.   SUPER B COMPLEX/C PO Take 1 tablet by mouth every other day. Take 1 tablet by mouth every other day- rotating with the Vitamin B-12 tablets   VITAMIN B-12 PO Take 1 tablet by mouth See admin instructions. Take 1 tablet by mouth every other day- rotating with the Super B Complex/C tablets   Zinc 30 MG Caps Take 30 mg by mouth every 3 (three) days.       ASK your doctor about these medications    HYDROcodone-acetaminophen 5-325 MG tablet  Commonly known as: NORCO/VICODIN Take 1 tablet by mouth every 6 (six) hours as needed for up to 3 days for moderate pain (pain score 4-6). Ask about: Should I take this medication?               Discharge Care Instructions  (From admission, onward)           Start     Ordered   09/18/22 0000  Discharge wound care:       Comments: Reinforce dressing.   09/18/22 1131            Contact information for follow-up providers     Marlou Sa, Tonna Corner, MD. Schedule an appointment as soon as possible for a visit in 1 week(s).   Specialty: Orthopedic Surgery Why: hip surgery followup Contact information: [address] [address] [phone]              Contact information for after-discharge care     Destination     [facility] Preferred SNF .   Service: Skilled Nursing Contact information: [id number] [place] [phone]                      Time coordinating discharge: 39 minutes  Signed:  Halei Hanover  [facility] 09/20/2022, 9:30 AM

## 2022-09-20 NOTE — Care Management Important Message (Signed)
Important Message  Patient Details IM Letter placed in Patients room. Name: David Cervantes MRN: [id number] Date of Birth: 02/04/1927   Medicare Important Message Given:  Yes     Kerin Salen 09/20/2022, 10:54 AM

## 2022-09-21 DIAGNOSIS — I5032 Chronic diastolic (congestive) heart failure: Secondary | ICD-10-CM | POA: Diagnosis not present

## 2022-09-21 DIAGNOSIS — E119 Type 2 diabetes mellitus without complications: Secondary | ICD-10-CM | POA: Diagnosis not present

## 2022-09-21 DIAGNOSIS — E782 Mixed hyperlipidemia: Secondary | ICD-10-CM | POA: Diagnosis not present

## 2022-09-21 DIAGNOSIS — E43 Unspecified severe protein-calorie malnutrition: Secondary | ICD-10-CM | POA: Diagnosis not present

## 2022-09-22 DIAGNOSIS — E782 Mixed hyperlipidemia: Secondary | ICD-10-CM | POA: Diagnosis not present

## 2022-09-22 DIAGNOSIS — F0390 Unspecified dementia without behavioral disturbance: Secondary | ICD-10-CM | POA: Diagnosis not present

## 2022-09-22 DIAGNOSIS — E118 Type 2 diabetes mellitus with unspecified complications: Secondary | ICD-10-CM | POA: Diagnosis not present

## 2022-09-22 DIAGNOSIS — E43 Unspecified severe protein-calorie malnutrition: Secondary | ICD-10-CM | POA: Diagnosis not present

## 2022-09-22 DIAGNOSIS — I5032 Chronic diastolic (congestive) heart failure: Secondary | ICD-10-CM | POA: Diagnosis not present

## 2022-09-22 DIAGNOSIS — I442 Atrioventricular block, complete: Secondary | ICD-10-CM | POA: Diagnosis not present

## 2022-09-22 DIAGNOSIS — S72141D Displaced intertrochanteric fracture of right femur, subsequent encounter for closed fracture with routine healing: Secondary | ICD-10-CM | POA: Diagnosis not present

## 2022-09-22 DIAGNOSIS — Z9181 History of falling: Secondary | ICD-10-CM | POA: Diagnosis not present

## 2022-09-22 DIAGNOSIS — Z4789 Encounter for other orthopedic aftercare: Secondary | ICD-10-CM | POA: Diagnosis not present

## 2022-09-27 DIAGNOSIS — I5032 Chronic diastolic (congestive) heart failure: Secondary | ICD-10-CM | POA: Diagnosis not present

## 2022-09-27 DIAGNOSIS — E119 Type 2 diabetes mellitus without complications: Secondary | ICD-10-CM | POA: Diagnosis not present

## 2022-09-27 DIAGNOSIS — E782 Mixed hyperlipidemia: Secondary | ICD-10-CM | POA: Diagnosis not present

## 2022-09-27 DIAGNOSIS — E43 Unspecified severe protein-calorie malnutrition: Secondary | ICD-10-CM | POA: Diagnosis not present

## 2022-09-29 DIAGNOSIS — I5032 Chronic diastolic (congestive) heart failure: Secondary | ICD-10-CM | POA: Diagnosis not present

## 2022-09-29 DIAGNOSIS — F0390 Unspecified dementia without behavioral disturbance: Secondary | ICD-10-CM | POA: Diagnosis not present

## 2022-09-29 DIAGNOSIS — E43 Unspecified severe protein-calorie malnutrition: Secondary | ICD-10-CM | POA: Diagnosis not present

## 2022-09-29 DIAGNOSIS — S72141D Displaced intertrochanteric fracture of right femur, subsequent encounter for closed fracture with routine healing: Secondary | ICD-10-CM | POA: Diagnosis not present

## 2022-09-29 DIAGNOSIS — Z9181 History of falling: Secondary | ICD-10-CM | POA: Diagnosis not present

## 2022-09-29 DIAGNOSIS — I442 Atrioventricular block, complete: Secondary | ICD-10-CM | POA: Diagnosis not present

## 2022-09-29 DIAGNOSIS — E782 Mixed hyperlipidemia: Secondary | ICD-10-CM | POA: Diagnosis not present

## 2022-09-29 DIAGNOSIS — Z4789 Encounter for other orthopedic aftercare: Secondary | ICD-10-CM | POA: Diagnosis not present

## 2022-09-29 DIAGNOSIS — E118 Type 2 diabetes mellitus with unspecified complications: Secondary | ICD-10-CM | POA: Diagnosis not present

## 2022-10-06 DIAGNOSIS — S72141D Displaced intertrochanteric fracture of right femur, subsequent encounter for closed fracture with routine healing: Secondary | ICD-10-CM | POA: Diagnosis not present

## 2022-10-06 DIAGNOSIS — Z4789 Encounter for other orthopedic aftercare: Secondary | ICD-10-CM | POA: Diagnosis not present

## 2022-10-06 DIAGNOSIS — Z9181 History of falling: Secondary | ICD-10-CM | POA: Diagnosis not present

## 2022-10-06 DIAGNOSIS — F5105 Insomnia due to other mental disorder: Secondary | ICD-10-CM | POA: Diagnosis not present

## 2022-10-06 DIAGNOSIS — E118 Type 2 diabetes mellitus with unspecified complications: Secondary | ICD-10-CM | POA: Diagnosis not present

## 2022-10-06 DIAGNOSIS — F0390 Unspecified dementia without behavioral disturbance: Secondary | ICD-10-CM | POA: Diagnosis not present

## 2022-10-06 DIAGNOSIS — E782 Mixed hyperlipidemia: Secondary | ICD-10-CM | POA: Diagnosis not present

## 2022-10-06 DIAGNOSIS — I442 Atrioventricular block, complete: Secondary | ICD-10-CM | POA: Diagnosis not present

## 2022-10-06 DIAGNOSIS — E119 Type 2 diabetes mellitus without complications: Secondary | ICD-10-CM | POA: Diagnosis not present

## 2022-10-06 DIAGNOSIS — E43 Unspecified severe protein-calorie malnutrition: Secondary | ICD-10-CM | POA: Diagnosis not present

## 2022-10-06 DIAGNOSIS — I5032 Chronic diastolic (congestive) heart failure: Secondary | ICD-10-CM | POA: Diagnosis not present

## 2022-10-07 DIAGNOSIS — F0390 Unspecified dementia without behavioral disturbance: Secondary | ICD-10-CM | POA: Diagnosis not present

## 2022-10-07 DIAGNOSIS — E119 Type 2 diabetes mellitus without complications: Secondary | ICD-10-CM | POA: Diagnosis not present

## 2022-10-07 DIAGNOSIS — I5032 Chronic diastolic (congestive) heart failure: Secondary | ICD-10-CM | POA: Diagnosis not present

## 2022-10-07 DIAGNOSIS — E43 Unspecified severe protein-calorie malnutrition: Secondary | ICD-10-CM | POA: Diagnosis not present

## 2022-10-11 DIAGNOSIS — I5032 Chronic diastolic (congestive) heart failure: Secondary | ICD-10-CM | POA: Diagnosis not present

## 2022-10-11 DIAGNOSIS — I10 Essential (primary) hypertension: Secondary | ICD-10-CM | POA: Diagnosis not present

## 2022-10-11 DIAGNOSIS — S72141S Displaced intertrochanteric fracture of right femur, sequela: Secondary | ICD-10-CM | POA: Diagnosis not present

## 2022-10-11 DIAGNOSIS — E119 Type 2 diabetes mellitus without complications: Secondary | ICD-10-CM | POA: Diagnosis not present

## 2022-10-13 ENCOUNTER — Ambulatory Visit: Payer: Medicare PPO | Admitting: Surgical

## 2022-10-13 ENCOUNTER — Encounter: Payer: Self-pay | Admitting: Orthopedic Surgery

## 2022-10-13 ENCOUNTER — Ambulatory Visit (INDEPENDENT_AMBULATORY_CARE_PROVIDER_SITE_OTHER): Payer: Medicare PPO

## 2022-10-13 DIAGNOSIS — M25551 Pain in right hip: Secondary | ICD-10-CM | POA: Diagnosis not present

## 2022-10-13 DIAGNOSIS — S72141D Displaced intertrochanteric fracture of right femur, subsequent encounter for closed fracture with routine healing: Secondary | ICD-10-CM | POA: Diagnosis not present

## 2022-10-13 DIAGNOSIS — E43 Unspecified severe protein-calorie malnutrition: Secondary | ICD-10-CM | POA: Diagnosis not present

## 2022-10-13 DIAGNOSIS — I5032 Chronic diastolic (congestive) heart failure: Secondary | ICD-10-CM | POA: Diagnosis not present

## 2022-10-13 DIAGNOSIS — Z4789 Encounter for other orthopedic aftercare: Secondary | ICD-10-CM | POA: Diagnosis not present

## 2022-10-13 DIAGNOSIS — I442 Atrioventricular block, complete: Secondary | ICD-10-CM | POA: Diagnosis not present

## 2022-10-13 DIAGNOSIS — Z9181 History of falling: Secondary | ICD-10-CM | POA: Diagnosis not present

## 2022-10-13 DIAGNOSIS — F0390 Unspecified dementia without behavioral disturbance: Secondary | ICD-10-CM | POA: Diagnosis not present

## 2022-10-13 DIAGNOSIS — E782 Mixed hyperlipidemia: Secondary | ICD-10-CM | POA: Diagnosis not present

## 2022-10-13 DIAGNOSIS — E118 Type 2 diabetes mellitus with unspecified complications: Secondary | ICD-10-CM | POA: Diagnosis not present

## 2022-10-13 NOTE — Progress Notes (Signed)
   Post-Op Visit Note   Patient: David Cervantes           Date of Birth: Jun 26, 1927           MRN: [id number] Visit Date: 10/13/2022 PCP: [name], MD   Assessment & Plan:  Chief Complaint:  Chief Complaint  Patient presents with   Right Thigh - Routine Post Op   Visit Diagnoses:  1. Pain of right hip     Plan: David Cervantes is a 88 year old male who presents s/p right intramedullary nail for intertrochanteric hip fracture on 09/12/2022.  He is accompanied by his daughter today.  Currently staying at [facility] facility.  According to his daughter he is doing physical therapy 6 days a week and walking with assistance and a walker for about 15 to 25 feet.  Taking aspirin 81 mg twice daily for VTE prophylaxis.  Denies much in the way of hip pain and does not recall having surgery.  His daughter states that he has been complaining less and less about the hip pain but still does note discomfort.  On exam, incisions are intact with sutures remaining in the incisions.  These were removed and replaced with Steri-Strips.  No calf tenderness bilaterally.  Negative Homans' sign bilaterally.  Hip flexion intact.  He does have discomfort with passive motion of the hip joint but it is not severe.  Plan is to continue with mobilization with physical therapy at [facility].  Okay to go down to aspirin once daily for DVT prophylaxis.  Radiographs of the right femur taken today demonstrate hardware in good position without any compromise and no change in position of the hardware since previous radiographs.  Fracture appears well aligned with some fracture consolidation noted.  Medial calcar piece without significant displacement compared with last radiographs.  Follow-up for clinical recheck with Dr. Marlou Sa in 4 to 6 weeks.  Follow-Up Instructions: No follow-ups on file.   Orders:  Orders Placed This Encounter  Procedures   XR FEMUR, MIN 2 VIEWS RIGHT   No orders of the defined types were placed in this  encounter.   Imaging: No results found.  PMFS History: Patient Active Problem List   Diagnosis Date Noted   Protein-calorie malnutrition, severe 09/13/2022   Closed right hip fracture ([place]) 09/12/2022   Chronic diastolic CHF (congestive heart failure) ([place]) 09/12/2022   Complete heart block ([place]) 09/12/2022   Hypokalemia 09/12/2022   Normocytic anemia 09/12/2022   Dementia ([place]) 04/20/2022   Mixed hyperlipidemia 04/20/2022   Acute respiratory failure with hypoxia ([place]) 04/20/2022   DM (diabetes mellitus) ([place]) 04/20/2022   HTN (hypertension) 04/20/2022   Acute CHF ([place]) 04/20/2022   Past Medical History:  Diagnosis Date   Diabetes mellitus without complication ([place])     No family history on file.  Past Surgical History:  Procedure Laterality Date   INTRAMEDULLARY (IM) NAIL INTERTROCHANTERIC Right 09/12/2022   Procedure: INTRAMEDULLARY (IM) NAIL INTERTROCHANTERIC;  Surgeon: Meredith Pel, MD;  Location: [facility];  Service: Orthopedics;  Laterality: Right;   Social History   Occupational History   Not on file  Tobacco Use   Smoking status: Never    Passive exposure: Never   Smokeless tobacco: Not on file  Vaping Use   Vaping Use: Never used  Substance and Sexual Activity   Alcohol use: Not Currently   Drug use: Never   Sexual activity: Never

## 2022-10-20 ENCOUNTER — Ambulatory Visit: Payer: Medicare PPO | Admitting: Cardiology

## 2022-10-20 DIAGNOSIS — Z9181 History of falling: Secondary | ICD-10-CM | POA: Diagnosis not present

## 2022-10-20 DIAGNOSIS — F0390 Unspecified dementia without behavioral disturbance: Secondary | ICD-10-CM | POA: Diagnosis not present

## 2022-10-20 DIAGNOSIS — Z4789 Encounter for other orthopedic aftercare: Secondary | ICD-10-CM | POA: Diagnosis not present

## 2022-10-20 DIAGNOSIS — I442 Atrioventricular block, complete: Secondary | ICD-10-CM | POA: Diagnosis not present

## 2022-10-20 DIAGNOSIS — E118 Type 2 diabetes mellitus with unspecified complications: Secondary | ICD-10-CM | POA: Diagnosis not present

## 2022-10-20 DIAGNOSIS — E43 Unspecified severe protein-calorie malnutrition: Secondary | ICD-10-CM | POA: Diagnosis not present

## 2022-10-20 DIAGNOSIS — I5032 Chronic diastolic (congestive) heart failure: Secondary | ICD-10-CM | POA: Diagnosis not present

## 2022-10-20 DIAGNOSIS — S72141D Displaced intertrochanteric fracture of right femur, subsequent encounter for closed fracture with routine healing: Secondary | ICD-10-CM | POA: Diagnosis not present

## 2022-10-20 DIAGNOSIS — E782 Mixed hyperlipidemia: Secondary | ICD-10-CM | POA: Diagnosis not present

## 2022-10-27 DIAGNOSIS — E43 Unspecified severe protein-calorie malnutrition: Secondary | ICD-10-CM | POA: Diagnosis not present

## 2022-10-27 DIAGNOSIS — I5032 Chronic diastolic (congestive) heart failure: Secondary | ICD-10-CM | POA: Diagnosis not present

## 2022-10-27 DIAGNOSIS — S72141D Displaced intertrochanteric fracture of right femur, subsequent encounter for closed fracture with routine healing: Secondary | ICD-10-CM | POA: Diagnosis not present

## 2022-10-27 DIAGNOSIS — E118 Type 2 diabetes mellitus with unspecified complications: Secondary | ICD-10-CM | POA: Diagnosis not present

## 2022-10-27 DIAGNOSIS — E782 Mixed hyperlipidemia: Secondary | ICD-10-CM | POA: Diagnosis not present

## 2022-10-27 DIAGNOSIS — I442 Atrioventricular block, complete: Secondary | ICD-10-CM | POA: Diagnosis not present

## 2022-10-27 DIAGNOSIS — Z9181 History of falling: Secondary | ICD-10-CM | POA: Diagnosis not present

## 2022-10-27 DIAGNOSIS — Z4789 Encounter for other orthopedic aftercare: Secondary | ICD-10-CM | POA: Diagnosis not present

## 2022-10-27 DIAGNOSIS — F0390 Unspecified dementia without behavioral disturbance: Secondary | ICD-10-CM | POA: Diagnosis not present

## 2022-10-29 DIAGNOSIS — S72141S Displaced intertrochanteric fracture of right femur, sequela: Secondary | ICD-10-CM | POA: Diagnosis not present

## 2022-10-29 DIAGNOSIS — I10 Essential (primary) hypertension: Secondary | ICD-10-CM | POA: Diagnosis not present

## 2022-10-29 DIAGNOSIS — I5032 Chronic diastolic (congestive) heart failure: Secondary | ICD-10-CM | POA: Diagnosis not present

## 2022-10-29 DIAGNOSIS — E119 Type 2 diabetes mellitus without complications: Secondary | ICD-10-CM | POA: Diagnosis not present

## 2022-11-02 DIAGNOSIS — M2550 Pain in unspecified joint: Secondary | ICD-10-CM | POA: Diagnosis not present

## 2022-11-02 DIAGNOSIS — I5032 Chronic diastolic (congestive) heart failure: Secondary | ICD-10-CM | POA: Diagnosis not present

## 2022-11-03 DIAGNOSIS — I5032 Chronic diastolic (congestive) heart failure: Secondary | ICD-10-CM | POA: Diagnosis not present

## 2022-11-04 DIAGNOSIS — S72141D Displaced intertrochanteric fracture of right femur, subsequent encounter for closed fracture with routine healing: Secondary | ICD-10-CM | POA: Diagnosis not present

## 2022-11-04 DIAGNOSIS — I5032 Chronic diastolic (congestive) heart failure: Secondary | ICD-10-CM | POA: Diagnosis not present

## 2022-11-04 DIAGNOSIS — E118 Type 2 diabetes mellitus with unspecified complications: Secondary | ICD-10-CM | POA: Diagnosis not present

## 2022-11-04 DIAGNOSIS — Z9181 History of falling: Secondary | ICD-10-CM | POA: Diagnosis not present

## 2022-11-04 DIAGNOSIS — I442 Atrioventricular block, complete: Secondary | ICD-10-CM | POA: Diagnosis not present

## 2022-11-04 DIAGNOSIS — Z4789 Encounter for other orthopedic aftercare: Secondary | ICD-10-CM | POA: Diagnosis not present

## 2022-11-04 DIAGNOSIS — E43 Unspecified severe protein-calorie malnutrition: Secondary | ICD-10-CM | POA: Diagnosis not present

## 2022-11-04 DIAGNOSIS — E782 Mixed hyperlipidemia: Secondary | ICD-10-CM | POA: Diagnosis not present

## 2022-11-04 DIAGNOSIS — F0390 Unspecified dementia without behavioral disturbance: Secondary | ICD-10-CM | POA: Diagnosis not present

## 2022-11-08 DIAGNOSIS — I5032 Chronic diastolic (congestive) heart failure: Secondary | ICD-10-CM | POA: Diagnosis not present

## 2022-11-10 DIAGNOSIS — E118 Type 2 diabetes mellitus with unspecified complications: Secondary | ICD-10-CM | POA: Diagnosis not present

## 2022-11-10 DIAGNOSIS — I5032 Chronic diastolic (congestive) heart failure: Secondary | ICD-10-CM | POA: Diagnosis not present

## 2022-11-10 DIAGNOSIS — Z4789 Encounter for other orthopedic aftercare: Secondary | ICD-10-CM | POA: Diagnosis not present

## 2022-11-10 DIAGNOSIS — F0390 Unspecified dementia without behavioral disturbance: Secondary | ICD-10-CM | POA: Diagnosis not present

## 2022-11-10 DIAGNOSIS — E43 Unspecified severe protein-calorie malnutrition: Secondary | ICD-10-CM | POA: Diagnosis not present

## 2022-11-10 DIAGNOSIS — E782 Mixed hyperlipidemia: Secondary | ICD-10-CM | POA: Diagnosis not present

## 2022-11-10 DIAGNOSIS — I442 Atrioventricular block, complete: Secondary | ICD-10-CM | POA: Diagnosis not present

## 2022-11-10 DIAGNOSIS — S72141D Displaced intertrochanteric fracture of right femur, subsequent encounter for closed fracture with routine healing: Secondary | ICD-10-CM | POA: Diagnosis not present

## 2022-11-10 DIAGNOSIS — Z9181 History of falling: Secondary | ICD-10-CM | POA: Diagnosis not present

## 2022-11-14 ENCOUNTER — Inpatient Hospital Stay (HOSPITAL_COMMUNITY)
Admission: EM | Admit: 2022-11-14 | Discharge: 2022-11-19 | DRG: 291 | Disposition: A | Discharge status: Hospice | Payer: Medicare PPO | Source: Skilled Nursing Facility | Attending: Internal Medicine | Admitting: Internal Medicine

## 2022-11-14 ENCOUNTER — Emergency Department (HOSPITAL_COMMUNITY): Payer: Medicare PPO

## 2022-11-14 ENCOUNTER — Other Ambulatory Visit: Payer: Self-pay

## 2022-11-14 DIAGNOSIS — Z66 Do not resuscitate: Secondary | ICD-10-CM | POA: Diagnosis present

## 2022-11-14 DIAGNOSIS — I491 Atrial premature depolarization: Secondary | ICD-10-CM | POA: Diagnosis not present

## 2022-11-14 DIAGNOSIS — E43 Unspecified severe protein-calorie malnutrition: Secondary | ICD-10-CM | POA: Diagnosis present

## 2022-11-14 DIAGNOSIS — T502X5A Adverse effect of carbonic-anhydrase inhibitors, benzothiadiazides and other diuretics, initial encounter: Secondary | ICD-10-CM | POA: Diagnosis present

## 2022-11-14 DIAGNOSIS — Z515 Encounter for palliative care: Secondary | ICD-10-CM | POA: Diagnosis not present

## 2022-11-14 DIAGNOSIS — R531 Weakness: Secondary | ICD-10-CM | POA: Diagnosis present

## 2022-11-14 DIAGNOSIS — I442 Atrioventricular block, complete: Secondary | ICD-10-CM | POA: Diagnosis not present

## 2022-11-14 DIAGNOSIS — Z8616 Personal history of COVID-19: Secondary | ICD-10-CM

## 2022-11-14 DIAGNOSIS — E162 Hypoglycemia, unspecified: Principal | ICD-10-CM

## 2022-11-14 DIAGNOSIS — Z7189 Other specified counseling: Secondary | ICD-10-CM | POA: Diagnosis not present

## 2022-11-14 DIAGNOSIS — I272 Pulmonary hypertension, unspecified: Secondary | ICD-10-CM | POA: Diagnosis present

## 2022-11-14 DIAGNOSIS — F0390 Unspecified dementia without behavioral disturbance: Secondary | ICD-10-CM | POA: Diagnosis present

## 2022-11-14 DIAGNOSIS — E161 Other hypoglycemia: Secondary | ICD-10-CM | POA: Diagnosis not present

## 2022-11-14 DIAGNOSIS — Z789 Other specified health status: Secondary | ICD-10-CM | POA: Diagnosis not present

## 2022-11-14 DIAGNOSIS — I10 Essential (primary) hypertension: Secondary | ICD-10-CM | POA: Diagnosis not present

## 2022-11-14 DIAGNOSIS — Z794 Long term (current) use of insulin: Secondary | ICD-10-CM

## 2022-11-14 DIAGNOSIS — R4182 Altered mental status, unspecified: Secondary | ICD-10-CM

## 2022-11-14 DIAGNOSIS — F0280 Dementia in other diseases classified elsewhere without behavioral disturbance: Secondary | ICD-10-CM | POA: Diagnosis not present

## 2022-11-14 DIAGNOSIS — I509 Heart failure, unspecified: Secondary | ICD-10-CM

## 2022-11-14 DIAGNOSIS — Z8781 Personal history of (healed) traumatic fracture: Secondary | ICD-10-CM

## 2022-11-14 DIAGNOSIS — E1165 Type 2 diabetes mellitus with hyperglycemia: Secondary | ICD-10-CM | POA: Diagnosis present

## 2022-11-14 DIAGNOSIS — R55 Syncope and collapse: Secondary | ICD-10-CM | POA: Diagnosis not present

## 2022-11-14 DIAGNOSIS — E119 Type 2 diabetes mellitus without complications: Secondary | ICD-10-CM

## 2022-11-14 DIAGNOSIS — Z7401 Bed confinement status: Secondary | ICD-10-CM

## 2022-11-14 DIAGNOSIS — I4891 Unspecified atrial fibrillation: Secondary | ICD-10-CM | POA: Diagnosis not present

## 2022-11-14 DIAGNOSIS — Z79899 Other long term (current) drug therapy: Secondary | ICD-10-CM

## 2022-11-14 DIAGNOSIS — E11649 Type 2 diabetes mellitus with hypoglycemia without coma: Secondary | ICD-10-CM | POA: Diagnosis present

## 2022-11-14 DIAGNOSIS — Z711 Person with feared health complaint in whom no diagnosis is made: Secondary | ICD-10-CM | POA: Diagnosis not present

## 2022-11-14 DIAGNOSIS — R638 Other symptoms and signs concerning food and fluid intake: Secondary | ICD-10-CM | POA: Diagnosis not present

## 2022-11-14 DIAGNOSIS — I5043 Acute on chronic combined systolic (congestive) and diastolic (congestive) heart failure: Secondary | ICD-10-CM

## 2022-11-14 DIAGNOSIS — E8809 Other disorders of plasma-protein metabolism, not elsewhere classified: Secondary | ICD-10-CM | POA: Diagnosis present

## 2022-11-14 DIAGNOSIS — E782 Mixed hyperlipidemia: Secondary | ICD-10-CM | POA: Diagnosis present

## 2022-11-14 DIAGNOSIS — R0902 Hypoxemia: Secondary | ICD-10-CM | POA: Diagnosis not present

## 2022-11-14 DIAGNOSIS — R404 Transient alteration of awareness: Secondary | ICD-10-CM | POA: Diagnosis not present

## 2022-11-14 DIAGNOSIS — R410 Disorientation, unspecified: Secondary | ICD-10-CM | POA: Diagnosis not present

## 2022-11-14 DIAGNOSIS — I959 Hypotension, unspecified: Secondary | ICD-10-CM | POA: Diagnosis not present

## 2022-11-14 DIAGNOSIS — G9341 Metabolic encephalopathy: Secondary | ICD-10-CM | POA: Diagnosis present

## 2022-11-14 DIAGNOSIS — Z9181 History of falling: Secondary | ICD-10-CM | POA: Diagnosis not present

## 2022-11-14 DIAGNOSIS — E876 Hypokalemia: Secondary | ICD-10-CM | POA: Diagnosis not present

## 2022-11-14 DIAGNOSIS — I5033 Acute on chronic diastolic (congestive) heart failure: Secondary | ICD-10-CM | POA: Diagnosis not present

## 2022-11-14 DIAGNOSIS — I110 Hypertensive heart disease with heart failure: Secondary | ICD-10-CM | POA: Diagnosis not present

## 2022-11-14 DIAGNOSIS — G309 Alzheimer's disease, unspecified: Secondary | ICD-10-CM | POA: Diagnosis not present

## 2022-11-14 DIAGNOSIS — N179 Acute kidney failure, unspecified: Secondary | ICD-10-CM | POA: Diagnosis not present

## 2022-11-14 DIAGNOSIS — R0603 Acute respiratory distress: Secondary | ICD-10-CM | POA: Diagnosis present

## 2022-11-14 DIAGNOSIS — E0800 Diabetes mellitus due to underlying condition with hyperosmolarity without nonketotic hyperglycemic-hyperosmolar coma (NKHHC): Secondary | ICD-10-CM | POA: Diagnosis not present

## 2022-11-14 DIAGNOSIS — Z7984 Long term (current) use of oral hypoglycemic drugs: Secondary | ICD-10-CM

## 2022-11-14 DIAGNOSIS — Z681 Body mass index (BMI) 19 or less, adult: Secondary | ICD-10-CM | POA: Diagnosis not present

## 2022-11-14 DIAGNOSIS — R339 Retention of urine, unspecified: Secondary | ICD-10-CM | POA: Diagnosis not present

## 2022-11-14 LAB — I-STAT CHEM 8, ED
BUN: 19 mg/dL (ref 8–23)
Calcium, Ion: 0.95 mmol/L — ABNORMAL LOW (ref 1.15–1.40)
Chloride: 95 mmol/L — ABNORMAL LOW (ref 98–111)
Creatinine, Ser: 0.8 mg/dL (ref 0.61–1.24)
Glucose, Bld: 198 mg/dL — ABNORMAL HIGH (ref 70–99)
HCT: 26 % — ABNORMAL LOW (ref 39.0–52.0)
Hemoglobin: 8.8 g/dL — ABNORMAL LOW (ref 13.0–17.0)
Potassium: 2.3 mmol/L — CL (ref 3.5–5.1)
Sodium: 138 mmol/L (ref 135–145)
TCO2: 30 mmol/L (ref 22–32)

## 2022-11-14 LAB — I-STAT ARTERIAL BLOOD GAS, ED
Acid-Base Excess: 5 mmol/L — ABNORMAL HIGH (ref 0.0–2.0)
Bicarbonate: 29.6 mmol/L — ABNORMAL HIGH (ref 20.0–28.0)
Calcium, Ion: 1.07 mmol/L — ABNORMAL LOW (ref 1.15–1.40)
HCT: 27 % — ABNORMAL LOW (ref 39.0–52.0)
Hemoglobin: 9.2 g/dL — ABNORMAL LOW (ref 13.0–17.0)
O2 Saturation: 84 %
Patient temperature: 98.6
Potassium: <2 mmol/L — CL (ref 3.5–5.1)
Sodium: 139 mmol/L (ref 135–145)
TCO2: 31 mmol/L (ref 22–32)
pCO2 arterial: 45.1 mmHg (ref 32–48)
pH, Arterial: 7.424 (ref 7.35–7.45)
pO2, Arterial: 48 mmHg — ABNORMAL LOW (ref 83–108)

## 2022-11-14 LAB — GLUCOSE, CAPILLARY: Glucose-Capillary: 94 mg/dL (ref 70–99)

## 2022-11-14 LAB — COMPREHENSIVE METABOLIC PANEL
ALT: 16 U/L (ref 0–44)
AST: 15 U/L (ref 15–41)
Albumin: 1.6 g/dL — ABNORMAL LOW (ref 3.5–5.0)
Alkaline Phosphatase: 126 U/L (ref 38–126)
Anion gap: 9 (ref 5–15)
BUN: 15 mg/dL (ref 8–23)
CO2: 29 mmol/L (ref 22–32)
Calcium: 6.8 mg/dL — ABNORMAL LOW (ref 8.9–10.3)
Chloride: 98 mmol/L (ref 98–111)
Creatinine, Ser: 0.9 mg/dL (ref 0.61–1.24)
GFR, Estimated: >60 mL/min (ref >60)
Glucose, Bld: 229 mg/dL — ABNORMAL HIGH (ref 70–99)
Potassium: 2 mmol/L — CL (ref 3.5–5.1)
Sodium: 136 mmol/L (ref 135–145)
Total Bilirubin: 0.2 mg/dL — ABNORMAL LOW (ref 0.3–1.2)
Total Protein: 5.3 g/dL — ABNORMAL LOW (ref 6.5–8.1)

## 2022-11-14 LAB — LIPASE, BLOOD: Lipase: 22 U/L (ref 11–51)

## 2022-11-14 LAB — CBG MONITORING, ED
Glucose-Capillary: 176 mg/dL — ABNORMAL HIGH (ref 70–99)
Glucose-Capillary: 223 mg/dL — ABNORMAL HIGH (ref 70–99)
Glucose-Capillary: 85 mg/dL (ref 70–99)
Glucose-Capillary: 108 mg/dL — ABNORMAL HIGH (ref 70–99)
Glucose-Capillary: 46 mg/dL — ABNORMAL LOW (ref 70–99)
Glucose-Capillary: 67 mg/dL — ABNORMAL LOW (ref 70–99)
Glucose-Capillary: 108 mg/dL — ABNORMAL HIGH (ref 70–99)
Glucose-Capillary: 87 mg/dL (ref 70–99)
Glucose-Capillary: 91 mg/dL (ref 70–99)

## 2022-11-14 LAB — CBC WITH DIFFERENTIAL/PLATELET
Abs Immature Granulocytes: 0.1 10*3/uL — ABNORMAL HIGH (ref 0.00–0.07)
Basophils Absolute: 0.1 10*3/uL (ref 0.0–0.1)
Basophils Relative: 0 %
Eosinophils Absolute: 0 10*3/uL (ref 0.0–0.5)
Eosinophils Relative: 0 %
HCT: 28.1 % — ABNORMAL LOW (ref 39.0–52.0)
Hemoglobin: 9.1 g/dL — ABNORMAL LOW (ref 13.0–17.0)
Immature Granulocytes: 1 %
Lymphocytes Relative: 5 %
Lymphs Abs: 0.8 10*3/uL (ref 0.7–4.0)
MCH: 30.4 pg (ref 26.0–34.0)
MCHC: 32.4 g/dL (ref 30.0–36.0)
MCV: 94 fL (ref 80.0–100.0)
Monocytes Absolute: 1 10*3/uL (ref 0.1–1.0)
Monocytes Relative: 7 %
Neutro Abs: 12.8 10*3/uL — ABNORMAL HIGH (ref 1.7–7.7)
Neutrophils Relative %: 87 %
Platelets: 386 10*3/uL (ref 150–400)
RBC: 2.99 MIL/uL — ABNORMAL LOW (ref 4.22–5.81)
RDW: 13.8 % (ref 11.5–15.5)
WBC: 14.7 10*3/uL — ABNORMAL HIGH (ref 4.0–10.5)
nRBC: 0 % (ref 0.0–0.2)

## 2022-11-14 LAB — BRAIN NATRIURETIC PEPTIDE: B Natriuretic Peptide: 472.8 pg/mL — ABNORMAL HIGH (ref 0.0–100.0)

## 2022-11-14 LAB — PROCALCITONIN: Procalcitonin: 0.26 ng/mL

## 2022-11-14 LAB — AMMONIA: Ammonia: 39 umol/L — ABNORMAL HIGH (ref 9–35)

## 2022-11-14 LAB — LACTIC ACID, PLASMA: Lactic Acid, Venous: 1.6 mmol/L (ref 0.5–1.9)

## 2022-11-14 LAB — TSH: TSH: 11.932 u[IU]/mL — ABNORMAL HIGH (ref 0.350–4.500)

## 2022-11-14 MED ORDER — CALCIUM GLUCONATE-NACL 1-0.675 GM/50ML-% IV SOLN
1.0000 g | Freq: Once | INTRAVENOUS | Status: AC
  Administered 2022-11-14: 1000 mg via INTRAVENOUS
  Filled 2022-11-14: qty 50

## 2022-11-14 MED ORDER — ENOXAPARIN SODIUM 40 MG/0.4ML IJ SOSY
40.0000 mg | PREFILLED_SYRINGE | INTRAMUSCULAR | Status: DC
  Administered 2022-11-14 – 2022-11-16 (×3): 40 mg via SUBCUTANEOUS
  Filled 2022-11-14 (×3): qty 0.4

## 2022-11-14 MED ORDER — DEXTROSE 50 % IV SOLN
INTRAVENOUS | Status: AC
  Filled 2022-11-14: qty 50

## 2022-11-14 MED ORDER — HYDRALAZINE HCL 20 MG/ML IJ SOLN: 2.0000 mg | Freq: Four times a day (QID) | INTRAMUSCULAR | Status: DC | PRN

## 2022-11-14 MED ORDER — INSULIN ASPART 100 UNIT/ML IJ SOLN
0.0000 [IU] | Freq: Three times a day (TID) | INTRAMUSCULAR | Status: DC
  Administered 2022-11-15: 7 [IU] via SUBCUTANEOUS
  Administered 2022-11-15 – 2022-11-16 (×2): 2 [IU] via SUBCUTANEOUS
  Administered 2022-11-16: 5 [IU] via SUBCUTANEOUS
  Administered 2022-11-16: 3 [IU] via SUBCUTANEOUS

## 2022-11-14 MED ORDER — MAGNESIUM SULFATE 2 GM/50ML IV SOLN
2.0000 g | Freq: Once | INTRAVENOUS | Status: AC
  Administered 2022-11-14: 2 g via INTRAVENOUS
  Filled 2022-11-14: qty 50

## 2022-11-14 MED ORDER — MAGNESIUM SULFATE 2 GM/50ML IV SOLN: 2.0000 g | Freq: Once | INTRAVENOUS | Status: DC

## 2022-11-14 MED ORDER — POTASSIUM CHLORIDE CRYS ER 20 MEQ PO TBCR: 40.0000 meq | EXTENDED_RELEASE_TABLET | Freq: Once | ORAL | Status: DC

## 2022-11-14 MED ORDER — ACETAMINOPHEN 325 MG PO TABS
325.0000 mg | ORAL_TABLET | Freq: Four times a day (QID) | ORAL | Status: DC | PRN
  Filled 2022-11-14: qty 2

## 2022-11-14 MED ORDER — ACETAMINOPHEN 325 MG PO TABS
650.0000 mg | ORAL_TABLET | ORAL | Status: DC | PRN
  Administered 2022-11-15: 650 mg via ORAL

## 2022-11-14 MED ORDER — DEXTROSE 50 % IV SOLN
1.0000 | Freq: Once | INTRAVENOUS | Status: AC
  Administered 2022-11-14: 50 mL via INTRAVENOUS

## 2022-11-14 MED ORDER — SODIUM CHLORIDE 0.9% FLUSH
3.0000 mL | Freq: Two times a day (BID) | INTRAVENOUS | Status: DC
  Administered 2022-11-14 – 2022-11-19 (×9): 3 mL via INTRAVENOUS

## 2022-11-14 MED ORDER — MAGNESIUM OXIDE -MG SUPPLEMENT 400 (240 MG) MG PO TABS
400.0000 mg | ORAL_TABLET | Freq: Every day | ORAL | Status: DC
  Administered 2022-11-15 – 2022-11-16 (×2): 400 mg via ORAL
  Filled 2022-11-14 (×2): qty 1

## 2022-11-14 MED ORDER — SODIUM CHLORIDE 0.9 % IV SOLN: 250.0000 mL | INTRAVENOUS | Status: DC | PRN

## 2022-11-14 MED ORDER — SODIUM CHLORIDE 0.9% FLUSH
3.0000 mL | INTRAVENOUS | Status: DC | PRN
  Administered 2022-11-16: 3 mL via INTRAVENOUS

## 2022-11-14 MED ORDER — ONDANSETRON HCL 4 MG/2ML IJ SOLN: 4.0000 mg | Freq: Four times a day (QID) | INTRAMUSCULAR | Status: DC | PRN

## 2022-11-14 MED ORDER — SODIUM CHLORIDE 0.9 % IV SOLN
500.0000 mg | Freq: Once | INTRAVENOUS | Status: AC
  Administered 2022-11-14: 500 mg via INTRAVENOUS
  Filled 2022-11-14: qty 5

## 2022-11-14 MED ORDER — SODIUM CHLORIDE 0.9 % IV SOLN
1.0000 g | Freq: Once | INTRAVENOUS | Status: AC
  Administered 2022-11-14: 1 g via INTRAVENOUS
  Filled 2022-11-14: qty 10

## 2022-11-14 MED ORDER — DOCUSATE SODIUM 100 MG PO CAPS
100.0000 mg | ORAL_CAPSULE | Freq: Two times a day (BID) | ORAL | Status: DC
  Administered 2022-11-16 – 2022-11-18 (×3): 100 mg via ORAL
  Filled 2022-11-14 (×7): qty 1

## 2022-11-14 MED ORDER — POTASSIUM CHLORIDE 10 MEQ/100ML IV SOLN
10.0000 meq | INTRAVENOUS | Status: AC
  Administered 2022-11-14 (×3): 10 meq via INTRAVENOUS
  Filled 2022-11-14 (×3): qty 100

## 2022-11-14 NOTE — H&P (Addendum)
History and Physical    David Cervantes [id number] DOB: December 13, 1927 DOA: 11/14/2022  PCP: [name], MD (Confirm with patient/family/NH records and if not entered, this has to be entered at [facility] point of entry) Patient coming from: SNF  I have personally briefly reviewed patient's old medical records in [place]  Chief Complaint: Was found unresponsive with glucose in the 69s  HPI: David Cervantes is a 88 y.o. male with medical history significant of chronic HFpEF, high-grade AV block, IDDM, HTN, pulmonary hypertension, advanced dementia, who was found unresponsive at nursing home and sent to ED.  Patient was found unresponsive this morning and nursing home when it was found his glucose in the 30s and bradycardia in the 40s.  Right now, patient unable to answer any questions, or history provided by daughter at bedside.  Daughter reported that patient has had increasing bilateral edema and increasing shortness of breath and PCP started double dose Lasix since yesterday.  And patient has been eating and drinking as normal yesterday.  No diarrhea.  ED Course: Patient was found in bradycardia, monitor showed third-degree AV block, blood pressure borderline low hypoxic 88% on room air.  Glucose 40s was given 2 rounds of D50 and remained borderline low.  Chest x-ray showed pulmonary congestion and left-sided pleural effusion.  K2.0.  Creatinine 1.0.  Patient was given D50 x 2, and IV K replacement x 3.  Review of Systems: Unable to perform, patient lethargic and confused  Past Medical History:  Diagnosis Date   Diabetes mellitus without complication [facility])     Past Surgical History:  Procedure Laterality Date   INTRAMEDULLARY (IM) NAIL INTERTROCHANTERIC Right 09/12/2022   Procedure: INTRAMEDULLARY (IM) NAIL INTERTROCHANTERIC;  Surgeon: Meredith Pel, MD;  Location: [facility] ORS;  Service: Orthopedics;  Laterality: Right;     reports that he has never smoked. He has  never been exposed to tobacco smoke. He does not have any smokeless tobacco history on file. He reports that he does not currently use alcohol. He reports that he does not use drugs.  Allergies  Allergen Reactions   Tape Other (See Comments)    SKIN TEARS AND BRUISES VERY EASILY!!    No family history on file.   Prior to Admission medications   Medication Sig Start Date End Date Taking? Authorizing Provider  acetaminophen (TYLENOL) 325 MG tablet Take 1-2 tablets (325-650 mg total) by mouth every 6 (six) hours as needed for mild pain (pain score 1-3 or temp > 100.5). 09/16/22   Barb Merino, MD  cholecalciferol (VITAMIN D3) 25 MCG (1000 UNIT) tablet Take 1,000 Units by mouth 3 (three) times a week.    [provider]  Cyanocobalamin (VITAMIN B-12 PO) Take 1 tablet by mouth See admin instructions. Take 1 tablet by mouth every other day- rotating with the Super B Complex/C tablets    [provider]  docusate sodium (COLACE) 100 MG capsule Take 1 capsule (100 mg total) by mouth 2 (two) times daily. 09/16/22   Barb Merino, MD  furosemide (LASIX) 20 MG tablet Take 10 mg by mouth in the morning. 05/19/22   [provider]  LANTUS SOLOSTAR 100 UNIT/ML Solostar Pen Inject 8 Units into the skin daily before breakfast. 09/16/22   Barb Merino, MD  magnesium oxide (MAG-OX) 400 MG tablet Take 1 tablet (400 mg total) by mouth daily. Patient taking differently: Take 200 mg by mouth daily. 05/25/22   Donato Heinz, MD  metFORMIN (GLUCOPHAGE-XR) 500 MG  24 hr tablet Take 500 mg by mouth daily with breakfast. 01/05/22   [provider]  potassium chloride (KLOR-CON M) 10 MEQ tablet Take 5 mEq by mouth daily. 05/19/22   [provider]  ramipril (ALTACE) 2.5 MG capsule Take 2.5 mg by mouth in the morning. 01/05/22   [provider]  SUPER B COMPLEX/C PO Take 1 tablet by mouth every other day. Take 1 tablet by mouth every other day- rotating with the  Vitamin B-12 tablets    [provider]  vitamin C (ASCORBIC ACID) 500 MG tablet Take 500 mg by mouth daily.    [provider]  Zinc 30 MG CAPS Take 30 mg by mouth every 3 (three) days.    [provider]    Physical Exam: Vitals:   11/14/22 1300 11/14/22 1303 11/14/22 1315 11/14/22 1330  BP: (!) 103/53  (!) 128/43 (!) 115/48  Pulse: (!) 51 (!) 49 (!) 48 (!) 47  Resp: (!) 0 (!) 24 13 18   Temp:      TempSrc:      SpO2: 100% 100% 100% 100%  Weight:      Height:        Constitutional: NAD, calm, comfortable Vitals:   11/14/22 1300 11/14/22 1303 11/14/22 1315 11/14/22 1330  BP: (!) 103/53  (!) 128/43 (!) 115/48  Pulse: (!) 51 (!) 49 (!) 48 (!) 47  Resp: (!) 0 (!) 24 13 18   Temp:      TempSrc:      SpO2: 100% 100% 100% 100%  Weight:      Height:       Eyes: PERRL, lids and conjunctivae normal ENMT: Mucous membranes are moist. Posterior pharynx clear of any exudate or lesions.Normal dentition.  Neck: normal, supple, no masses, no thyromegaly Respiratory: clear to auscultation bilaterally, no wheezing, fine crackles to bilateral mid levels, increasing breathing effort. No accessory muscle use.  Cardiovascular: Regular rate and rhythm, no murmurs / rubs / gallops.  2+ extremity edema. 2+ pedal pulses. No carotid bruits.  Abdomen: no tenderness, no masses palpated. No hepatosplenomegaly. Bowel sounds positive.  Musculoskeletal: no clubbing / cyanosis. No joint deformity upper and lower extremities. Good ROM, no contractures. Normal muscle tone.  Skin: no rashes, lesions, ulcers. No induration Neurologic: No facial droops, moving limbs occasionally Psychiatric: Sleepy, arousable, confused    Labs on Admission: I have personally reviewed following labs and imaging studies  CBC: Recent Labs  Lab 11/14/22 1113 11/14/22 1118 11/14/22 1126  WBC 14.7*  --   --   NEUTROABS 12.8*  --   --   HGB 9.1* 9.2* 8.8*  HCT 28.1* 27.0* 26.0*  MCV 94.0  --   --    PLT 386  --   --    Basic Metabolic Panel: Recent Labs  Lab 11/14/22 1113 11/14/22 1118 11/14/22 1126  NA 136 139 138  K 2.0* <2.0* 2.3*  CL 98  --  95*  CO2 29  --   --   GLUCOSE 229*  --  198*  BUN 15  --  19  CREATININE 0.90  --  0.80  CALCIUM 6.8*  --   --    GFR: Estimated Creatinine Clearance: 51.8 mL/min (by C-G formula based on SCr of 0.8 mg/dL). Liver Function Tests: Recent Labs  Lab 11/14/22 1113  AST 15  ALT 16  ALKPHOS 126  BILITOT 0.2*  PROT 5.3*  ALBUMIN 1.6*   Recent Labs  Lab 11/14/22 1113  LIPASE 22   Recent Labs  Lab 11/14/22 1114  AMMONIA 39*   Coagulation Profile: No results for input(s): "INR", "PROTIME" in the last 168 hours. Cardiac Enzymes: No results for input(s): "CKTOTAL", "CKMB", "CKMBINDEX", "TROPONINI" in the last 168 hours. BNP (last 3 results) No results for input(s): "PROBNP" in the last 8760 hours. HbA1C: No results for input(s): "HGBA1C" in the last 72 hours. CBG: Recent Labs  Lab 11/14/22 1108 11/14/22 1113 11/14/22 1139 11/14/22 1233 11/14/22 1315  GLUCAP 223* 176* 85 46* 108*   Lipid Profile: No results for input(s): "CHOL", "HDL", "LDLCALC", "TRIG", "CHOLHDL", "LDLDIRECT" in the last 72 hours. Thyroid Function Tests: Recent Labs    11/14/22 1113  TSH 11.932*   Anemia Panel: No results for input(s): "VITAMINB12", "FOLATE", "FERRITIN", "TIBC", "IRON", "RETICCTPCT" in the last 72 hours. Urine analysis:    Component Value Date/Time   COLORURINE YELLOW 03/25/2022 2120   APPEARANCEUR CLEAR 03/25/2022 2120   LABSPEC 1.010 03/25/2022 2120   PHURINE 5.0 03/25/2022 2120   GLUCOSEU NEGATIVE 03/25/2022 2120   HGBUR MODERATE (A) 03/25/2022 2120   [place] NEGATIVE 03/25/2022 2120   [place] NEGATIVE 03/25/2022 2120   PROTEINUR 100 (A) 03/25/2022 2120   NITRITE NEGATIVE 03/25/2022 2120   LEUKOCYTESUR NEGATIVE 03/25/2022 2120    Radiological Exams on Admission: CT HEAD WO CONTRAST (5MM)  Result Date:  11/14/2022 CLINICAL DATA:  Altered mental status EXAM: CT HEAD WITHOUT CONTRAST TECHNIQUE: Contiguous axial images were obtained from the base of the skull through the vertex without intravenous contrast. RADIATION DOSE REDUCTION: This exam was performed according to the departmental dose-optimization program which includes automated exposure control, adjustment of the mA and/or kV according to patient size and/or use of iterative reconstruction technique. COMPARISON:  03/25/2022 FINDINGS: Brain: No acute intracranial findings are seen. There are no signs of bleeding within the cranium. Small calcifications are seen in pons, possibly healed granulomas. There is no adjacent edema or mass effect. Cortical sulci are prominent. There is decreased density in periventricular and subcortical white matter. Vascular: Unremarkable. Skull: No fracture is seen in calvarium. There is fluid density in mastoid air cells, more so on the left side. Sinuses/Orbits: There is mucosal thickening in ethmoid and maxillary sinuses. There are no air-fluid levels. Other: None. IMPRESSION: No acute intracranial findings are seen in noncontrast CT brain. Atrophy. Small-vessel disease. Chronic ethmoid and maxillary sinusitis. Bilateral mastoid effusions are seen, more so on the left side. Electronically Signed   By: Elmer Picker M.D.   On: 11/14/2022 12:47   DG Chest Portable 1 View  Result Date: 11/14/2022 CLINICAL DATA:  Altered mental status. EXAM: PORTABLE CHEST 1 VIEW COMPARISON:  09/12/2022 FINDINGS: Patient is moderately rotated to the left. Lungs are somewhat hypoinflated demonstrate hazy opacification over the left midlung and lung base without change. Subtle hazy density over the medial right base. Findings may be due to left effusion with associated atelectasis. Infection is possible. Cardiomediastinal silhouette and remainder of the exam is unchanged. IMPRESSION: Hazy opacification over the left midlung and lung base  unchanged and may be due to effusion with associated atelectasis, although infection is possible. Mild hazy density over the medial right base. Electronically Signed   By: Marin Olp M.D.   On: 11/14/2022 11:37    EKG: Independently reviewed.  Third-degree AV block, no acute ST changes.  Assessment/Plan Principal Problem:   CHF (congestive heart failure) (HCC) Active Problems:   Acute CHF (HCC)   Hypokalemia   Hypoglycemia due to type 2  diabetes mellitus ([place])  (please populate well all problems here in Problem List. (For example, if patient is on BP meds at home and you resume or decide to hold them, it is a problem that needs to be her. Same for CAD, COPD, HLD and so on)  Acute metabolic encephalopathy, GCS=8 -Secondary to hypoglycemia -Will discontinue long-acting insulin and metformin.  Most recent A1c 9.8 in October. -Recheck fingerstick, and decide whether to start patient on D10 drip versus glucagon  Severe hypokalemia -Likely secondary to diuresis -Hold off diuresis, IV replacement as patient not awake enough to take p.o.  Recheck K level this evening and plan to initiate diuresis ASAP. -Mg level sent, will give one dose of IV Mg empirically.  Acute on chronic combined HFrEF HFpEF decompensation -Significant fluid overload -Plan to replenish K this afternoon and initiate Lasix in the evening -Most recent echo showed mild decrease LVEF 50% with focal hypokinetic changes along with grade 1 diastolic dysfunction -Other Ddx, received antibiotics in the ED to cover empirically for CAP, clinical picture predominantly CHF decompensation, and patient has no fever or increased white count.  Hold off further redosing antibiotics.  Check procalcitonin trending.  Hypocalcemia -One dose of IV Calcium Gluconate given.  Chronic high-grade AV block -Discussed with daughter at bedside, who agreed with conservative management as patient blood pressure maintains. -Patient was scheduled to  see EP physician after May hospitalization however sustained fracture and missed his appointment.  Overall prognosis poor given his age and other comorbidities, estimated patient is a poor candidate for PPM, as it will unlikely improve his life quality or prolong his life.  IDDM -On eating scale  HTN -Hold off BP meds as blood pressure borderline low.  DVT prophylaxis: Lovenox Code Status: DNR Family Communication: Daughter at bedside Disposition Plan: Patient is sick with severe hypoglycemia and hypokalemia, and CHF decompensation, requiring inpatient management, expect more than 2 midnight hospital stay. Consults called: Cardiology Admission status: PCU   Lequita Halt MD [facility] Pager [phone]  11/14/2022, 1:56 PM

## 2022-11-14 NOTE — ED Notes (Signed)
Pt had large soft BM. RN and NT changed diaper. Condom cath applied.

## 2022-11-14 NOTE — ED Provider Notes (Signed)
[place] EMERGENCY DEPARTMENT Provider Note   CSN: [id number] Arrival date & time: 11/14/22  1100     History  No chief complaint on file.   David Cervantes is a 88 y.o. male.  The history is provided by the EMS personnel, medical records and a relative. No language interpreter was used.  Altered Mental Status Presenting symptoms: unresponsiveness   Severity:  Severe Most recent episode:  Today Episode history:  Continuous Timing:  Constant Progression:  Unchanged Chronicity:  New      Home Medications Prior to Admission medications   Medication Sig Start Date End Date Taking? Authorizing Provider  acetaminophen (TYLENOL) 325 MG tablet Take 1-2 tablets (325-650 mg total) by mouth every 6 (six) hours as needed for mild pain (pain score 1-3 or temp > 100.5). 09/16/22   Barb Merino, MD  cholecalciferol (VITAMIN D3) 25 MCG (1000 UNIT) tablet Take 1,000 Units by mouth 3 (three) times a week.    [provider]  Cyanocobalamin (VITAMIN B-12 PO) Take 1 tablet by mouth See admin instructions. Take 1 tablet by mouth every other day- rotating with the Super B Complex/C tablets    [provider]  docusate sodium (COLACE) 100 MG capsule Take 1 capsule (100 mg total) by mouth 2 (two) times daily. 09/16/22   Barb Merino, MD  furosemide (LASIX) 20 MG tablet Take 10 mg by mouth in the morning. 05/19/22   [provider]  LANTUS SOLOSTAR 100 UNIT/ML Solostar Pen Inject 8 Units into the skin daily before breakfast. 09/16/22   Barb Merino, MD  magnesium oxide (MAG-OX) 400 MG tablet Take 1 tablet (400 mg total) by mouth daily. Patient taking differently: Take 200 mg by mouth daily. 05/25/22   Donato Heinz, MD  metFORMIN (GLUCOPHAGE-XR) 500 MG 24 hr tablet Take 500 mg by mouth daily with breakfast. 01/05/22   [provider]  potassium chloride (KLOR-CON M) 10 MEQ tablet Take 5 mEq by mouth daily. 05/19/22   [provider]  ramipril (ALTACE) 2.5 MG capsule Take 2.5 mg by mouth in the morning. 01/05/22   [provider]  SUPER B COMPLEX/C PO Take 1 tablet by mouth every other day. Take 1 tablet by mouth every other day- rotating with the Vitamin B-12 tablets    [provider]  vitamin C (ASCORBIC ACID) 500 MG tablet Take 500 mg by mouth daily.    [provider]  Zinc 30 MG CAPS Take 30 mg by mouth every 3 (three) days.    [provider]      Allergies    Tape    Review of Systems   Review of Systems  Unable to perform ROS: Patient unresponsive    Physical Exam Updated Vital Signs BP (!) 126/47   Pulse 62   Temp (!) 95.3 F (35.2 C) (Rectal)   Resp 11   Ht 5\' 9"  (1.753 m)   Wt 63.5 kg   SpO2 100%   BMI 20.67 kg/m  Physical Exam Vitals and nursing note reviewed.  Constitutional:      General: He is in acute distress.     Appearance: He is well-developed. He is ill-appearing.  HENT:     Head: Normocephalic and atraumatic.     Mouth/Throat:     Mouth: Mucous membranes are moist.  Eyes:     Extraocular Movements: Extraocular movements intact.     Conjunctiva/sclera: Conjunctivae normal.     Pupils: Pupils are equal,  round, and reactive to light.  Cardiovascular:     Rate and Rhythm: Regular rhythm. Bradycardia present.     Heart sounds: Murmur heard.  Pulmonary:     Effort: No respiratory distress.     Breath sounds: Rhonchi present. No wheezing or rales.  Chest:     Chest wall: No tenderness.  Abdominal:     Palpations: Abdomen is soft.     Tenderness: There is no abdominal tenderness. There is no guarding or rebound.  Musculoskeletal:        General: No swelling or tenderness.     Cervical back: Neck supple.     Right lower leg: Edema present.     Left lower leg: Edema present.  Skin:    General: Skin is warm and dry.     Capillary Refill: Capillary refill takes less than 2 seconds.     Findings: No erythema.  Neurological:      GCS: GCS eye subscore is 1. GCS verbal subscore is 1. GCS motor subscore is 1.     Motor: Abnormal muscle tone present.     Comments: GCS 3 upon arrival, after getting D50, patient started moving extremities and did open eyes to voice eventually.     ED Results / Procedures / Treatments   Labs (all labs ordered are listed, but only abnormal results are displayed) Labs Reviewed  CBC WITH DIFFERENTIAL/PLATELET - Abnormal; Notable for the following components:      Result Value   WBC 14.7 (*)    RBC 2.99 (*)    Hemoglobin 9.1 (*)    HCT 28.1 (*)    Neutro Abs 12.8 (*)    Abs Immature Granulocytes 0.10 (*)    All other components within normal limits  COMPREHENSIVE METABOLIC PANEL - Abnormal; Notable for the following components:   Potassium 2.0 (*)    Glucose, Bld 229 (*)    Calcium 6.8 (*)    Total Protein 5.3 (*)    Albumin 1.6 (*)    Total Bilirubin 0.2 (*)    All other components within normal limits  BRAIN NATRIURETIC PEPTIDE - Abnormal; Notable for the following components:   B Natriuretic Peptide 472.8 (*)    All other components within normal limits  AMMONIA - Abnormal; Notable for the following components:   Ammonia 39 (*)    All other components within normal limits  TSH - Abnormal; Notable for the following components:   TSH 11.932 (*)    All other components within normal limits  CBG MONITORING, ED - Abnormal; Notable for the following components:   Glucose-Capillary 223 (*)    All other components within normal limits  CBG MONITORING, ED - Abnormal; Notable for the following components:   Glucose-Capillary 176 (*)    All other components within normal limits  I-STAT ARTERIAL BLOOD GAS, ED - Abnormal; Notable for the following components:   pO2, Arterial 48 (*)    Bicarbonate 29.6 (*)    Acid-Base Excess 5.0 (*)    Potassium <2.0 (*)    Calcium, Ion 1.07 (*)    HCT 27.0 (*)    Hemoglobin 9.2 (*)    All other components within normal limits  I-STAT CHEM 8,  ED - Abnormal; Notable for the following components:   Potassium 2.3 (*)    Chloride 95 (*)    Glucose, Bld 198 (*)    Calcium, Ion 0.95 (*)    Hemoglobin 8.8 (*)    HCT 26.0 (*)  All other components within normal limits  CBG MONITORING, ED - Abnormal; Notable for the following components:   Glucose-Capillary 46 (*)    All other components within normal limits  CBG MONITORING, ED - Abnormal; Notable for the following components:   Glucose-Capillary 108 (*)    All other components within normal limits  CBG MONITORING, ED - Abnormal; Notable for the following components:   Glucose-Capillary 67 (*)    All other components within normal limits  URINE CULTURE  CULTURE, BLOOD (ROUTINE X 2)  CULTURE, BLOOD (ROUTINE X 2)  LACTIC ACID, PLASMA  LIPASE, BLOOD  PROCALCITONIN  URINALYSIS, ROUTINE W REFLEX MICROSCOPIC  MAGNESIUM  POTASSIUM  CBG MONITORING, ED    EKG EKG Interpretation  Date/Time:  Sunday November 14 2022 13:15:57 EST Ventricular Rate:  47 PR Interval:  73 QRS Duration: 84 QT Interval:  509 QTC Calculation: 450 R Axis:   -13 Text Interpretation: Bradycardia Atrial premature complex Short PR interval Low voltage, extremity and precordial leads when comapred to prior, slower rate.  hard to determine if this is a junctional rhythm like before or sinus with a long PR. NO STEMI Confirmed by Antony Blackbird [phone]) on 11/14/2022 2:03:34 PM  Radiology CT HEAD WO CONTRAST (5MM)  Result Date: 11/14/2022 CLINICAL DATA:  Altered mental status EXAM: CT HEAD WITHOUT CONTRAST TECHNIQUE: Contiguous axial images were obtained from the base of the skull through the vertex without intravenous contrast. RADIATION DOSE REDUCTION: This exam was performed according to the departmental dose-optimization program which includes automated exposure control, adjustment of the mA and/or kV according to patient size and/or use of iterative reconstruction technique. COMPARISON:  03/25/2022 FINDINGS:  Brain: No acute intracranial findings are seen. There are no signs of bleeding within the cranium. Small calcifications are seen in pons, possibly healed granulomas. There is no adjacent edema or mass effect. Cortical sulci are prominent. There is decreased density in periventricular and subcortical white matter. Vascular: Unremarkable. Skull: No fracture is seen in calvarium. There is fluid density in mastoid air cells, more so on the left side. Sinuses/Orbits: There is mucosal thickening in ethmoid and maxillary sinuses. There are no air-fluid levels. Other: None. IMPRESSION: No acute intracranial findings are seen in noncontrast CT brain. Atrophy. Small-vessel disease. Chronic ethmoid and maxillary sinusitis. Bilateral mastoid effusions are seen, more so on the left side. Electronically Signed   By: Elmer Picker M.D.   On: 11/14/2022 12:47   DG Chest Portable 1 View  Result Date: 11/14/2022 CLINICAL DATA:  Altered mental status. EXAM: PORTABLE CHEST 1 VIEW COMPARISON:  09/12/2022 FINDINGS: Patient is moderately rotated to the left. Lungs are somewhat hypoinflated demonstrate hazy opacification over the left midlung and lung base without change. Subtle hazy density over the medial right base. Findings may be due to left effusion with associated atelectasis. Infection is possible. Cardiomediastinal silhouette and remainder of the exam is unchanged. IMPRESSION: Hazy opacification over the left midlung and lung base unchanged and may be due to effusion with associated atelectasis, although infection is possible. Mild hazy density over the medial right base. Electronically Signed   By: Marin Olp M.D.   On: 11/14/2022 11:37    Procedures Procedures    CRITICAL CARE Performed by: Gwenyth Allegra Thersia Petraglia Total critical care time: 55 minutes Critical care time was exclusive of separately billable procedures and treating other patients. Critical care was necessary to treat or prevent imminent or  life-threatening deterioration. Critical care was time spent personally by me on the following  activities: development of treatment plan with patient and/or surrogate as well as nursing, discussions with consultants, evaluation of patient's response to treatment, examination of patient, obtaining history from patient or surrogate, ordering and performing treatments and interventions, ordering and review of laboratory studies, ordering and review of radiographic studies, pulse oximetry and re-evaluation of patient's condition.   Medications Ordered in ED Medications  dextrose 50 % solution (  Not Given 11/14/22 1132)  acetaminophen (TYLENOL) tablet 325-650 mg (has no administration in time range)  hydrALAZINE (APRESOLINE) injection 2 mg (has no administration in time range)  docusate sodium (COLACE) capsule 100 mg (has no administration in time range)  magnesium oxide (MAG-OX) tablet 400 mg (400 mg Oral Not Given 11/14/22 1354)  sodium chloride flush (NS) 0.9 % injection 3 mL (3 mLs Intravenous Not Given 11/14/22 1354)  sodium chloride flush (NS) 0.9 % injection 3 mL (has no administration in time range)  0.9 %  sodium chloride infusion (has no administration in time range)  acetaminophen (TYLENOL) tablet 650 mg (has no administration in time range)  ondansetron (ZOFRAN) injection 4 mg (has no administration in time range)  enoxaparin (LOVENOX) injection 40 mg (has no administration in time range)  insulin aspart (novoLOG) injection 0-9 Units (has no administration in time range)  dextrose 50 % solution (  Not Given 11/14/22 1518)  dextrose 50 % solution (  Given 11/14/22 1237)  dextrose 50 % solution 50 mL (50 mLs Intravenous Given 11/14/22 1131)  potassium chloride 10 mEq in 100 mL IVPB (0 mEq Intravenous Stopped 11/14/22 1518)  cefTRIAXone (ROCEPHIN) 1 g in sodium chloride 0.9 % 100 mL IVPB (0 g Intravenous Stopped 11/14/22 1414)  azithromycin (ZITHROMAX) 500 mg in sodium chloride 0.9 % 250 mL IVPB  (0 mg Intravenous Stopped 11/14/22 1518)  dextrose 50 % solution 50 mL (50 mLs Intravenous Given 11/14/22 1518)    ED Course/ Medical Decision Making/ A&P                           Medical Decision Making Amount and/or Complexity of Data Reviewed Labs: ordered. Radiology: ordered.  Risk Prescription drug management. Decision regarding hospitalization.    David Cervantes is a 88 y.o. male with a past medical history significant for dementia, hypertension, hyperlipidemia, diabetes, CHF, previous complete heart block, and recent hip surgery 2 months ago presents for altered mental status.  According to EMS, patient was at his baseline at some point last night but then today was unresponsive and having abnormal breathing.  Patient arrives with his DNR paperwork at the bedside.  Patient was maintaining his oxygen saturations but was having intermittent slow gasping breaths.  He was not responding to any stimuli and is grossly edematous.  There was no reported trauma as patient was in his bed.  On arrival, patient is maintaining oxygen saturations in the 90s.  It is in the low 90s will place on oxygen.  Lungs have some coarse breath sounds.  Patient was running to no painful stimuli.  Pupils are symmetric and reactive approximately 3 mm.  They were not pinpoint.  Abdomen and extremities are nontender.  He has significant edema in arms and legs.  CBG was quickly checked and his glucose was found to be 10.  An amp of D50 was pushed and glucose rose.  Patient then began to move his extremities slightly but is still not waking up to answer questions.  He is warm to the  touch, we will get a rectal temp and get a work-up to look for infection.  We will get a head CT and chest x-ray.  We will continue monitor his mental status and his glucoses and give more supplemental glucose if needed.  Anticipate reassessment after work-up.  11:35 AM Patient's daughter arrived and was able provide further  information.  She says that the patient has had more peripheral edema for the last few days and they were going to increase the Lasix to help with this.  Patient also had COVID several weeks ago and normally does not ambulate.  She reports that she spoke to him normally at approximately 9:30 PM last night.  On reassessment patient is starting to open eyes to voice and move extremities.  Potassium on the i-STAT Chem-8 was found to be 2.3 and on the ABG was found to be less than 2.  Although the formal metabolic panel has not returned, we will order IV potassium.  Patient will await further results to return and we will admit.  As he recently was positive for COVID-19, we will cancel the COVID order.  Patient's x-ray does show persistent opacity.  Given the cool temperature, soft pressures, hypoxia, altered mental status, leukocytosis, we will give antibiotics and treat for possible pneumonia.  Patient's glucose began to drop again into the 40s with persistent altered mental status so he was given more D50.  Potassium was confirmed to be in the low 2 range at 2.0 on the metabolic panel so we will continue his IV potassium supplementation.  Family is in agreement with admission.  Will admit for further management.  As he is DNR/DNI, we will try to admit to a progressive bed at this time.         Final Clinical Impression(s) / ED Diagnoses Final diagnoses:  Hypoglycemia  Altered mental status, unspecified altered mental status type  Hypoxia  Hypokalemia     Clinical Impression: 1. Hypoglycemia   2. Altered mental status, unspecified altered mental status type   3. Hypoxia   4. Hypokalemia     Disposition: Admit  This note was prepared with assistance of Dragon voice recognition software. Occasional wrong-word or sound-a-like substitutions may have occurred due to the inherent limitations of voice recognition software.      Annitta Fifield, Gwenyth Allegra, MD 11/14/22 [phone]

## 2022-11-14 NOTE — Progress Notes (Signed)
ABG obtained and results shown to MD

## 2022-11-14 NOTE — ED Triage Notes (Signed)
Pt BIB EMS due to resp distress/unresponsive. Pts sats were in the 30's per EMS. Pt came to ED being bagged. Family states pt usually alert but confused.

## 2022-11-14 NOTE — ED Notes (Addendum)
This RN took care over patient. B/L arms swollen noted on patient, extreme pitting edema noted on both. Both IV access upon flushing patient yells in pain repeats "Ow". One IV removed. RN attempted for IV access above swelling, unsuccessfully. IV team placed.   Overdue Magnesium Sulfate pending and upcoming Calcium gluconate until new IV access achieved, including overdue labs.  Family at bedside aware.

## 2022-11-14 NOTE — ED Notes (Signed)
Patient resting in bed, no s/s of distress, family present in the room, will continue to monitor.

## 2022-11-14 NOTE — ED Notes (Signed)
MD notified about getting 1 blood culture. MD advises to start abx

## 2022-11-14 NOTE — ED Notes (Signed)
Warm blankets applied

## 2022-11-14 NOTE — ED Notes (Signed)
RN messaged doctor about pts blood sugar

## 2022-11-15 ENCOUNTER — Encounter (HOSPITAL_COMMUNITY): Payer: Self-pay | Admitting: Internal Medicine

## 2022-11-15 DIAGNOSIS — E876 Hypokalemia: Secondary | ICD-10-CM

## 2022-11-15 DIAGNOSIS — I5033 Acute on chronic diastolic (congestive) heart failure: Secondary | ICD-10-CM

## 2022-11-15 DIAGNOSIS — E0800 Diabetes mellitus due to underlying condition with hyperosmolarity without nonketotic hyperglycemic-hyperosmolar coma (NKHHC): Secondary | ICD-10-CM

## 2022-11-15 DIAGNOSIS — E43 Unspecified severe protein-calorie malnutrition: Secondary | ICD-10-CM

## 2022-11-15 DIAGNOSIS — F0280 Dementia in other diseases classified elsewhere without behavioral disturbance: Secondary | ICD-10-CM

## 2022-11-15 DIAGNOSIS — G309 Alzheimer's disease, unspecified: Secondary | ICD-10-CM

## 2022-11-15 DIAGNOSIS — I10 Essential (primary) hypertension: Secondary | ICD-10-CM

## 2022-11-15 LAB — URINALYSIS, ROUTINE W REFLEX MICROSCOPIC
Bilirubin Urine: NEGATIVE
Glucose, UA: 50 mg/dL — AB
Hgb urine dipstick: NEGATIVE
Ketones, ur: NEGATIVE mg/dL
Leukocytes,Ua: NEGATIVE
Nitrite: NEGATIVE
Protein, ur: 100 mg/dL — AB
Specific Gravity, Urine: 1.013 (ref 1.005–1.030)
pH: 5 (ref 5.0–8.0)

## 2022-11-15 LAB — BASIC METABOLIC PANEL
Anion gap: 10 (ref 5–15)
BUN: 15 mg/dL (ref 8–23)
CO2: 28 mmol/L (ref 22–32)
Calcium: 7.1 mg/dL — ABNORMAL LOW (ref 8.9–10.3)
Chloride: 100 mmol/L (ref 98–111)
Creatinine, Ser: 0.83 mg/dL (ref 0.61–1.24)
GFR, Estimated: >60 mL/min (ref >60)
Glucose, Bld: 240 mg/dL — ABNORMAL HIGH (ref 70–99)
Potassium: 2.8 mmol/L — ABNORMAL LOW (ref 3.5–5.1)
Sodium: 138 mmol/L (ref 135–145)

## 2022-11-15 LAB — GLUCOSE, CAPILLARY
Glucose-Capillary: 305 mg/dL — ABNORMAL HIGH (ref 70–99)
Glucose-Capillary: 176 mg/dL — ABNORMAL HIGH (ref 70–99)
Glucose-Capillary: 100 mg/dL — ABNORMAL HIGH (ref 70–99)
Glucose-Capillary: 206 mg/dL — ABNORMAL HIGH (ref 70–99)

## 2022-11-15 LAB — CBC
HCT: 23.2 % — ABNORMAL LOW (ref 39.0–52.0)
Hemoglobin: 7.9 g/dL — ABNORMAL LOW (ref 13.0–17.0)
MCH: 30.4 pg (ref 26.0–34.0)
MCHC: 34.1 g/dL (ref 30.0–36.0)
MCV: 89.2 fL (ref 80.0–100.0)
Platelets: 329 10*3/uL (ref 150–400)
RBC: 2.6 MIL/uL — ABNORMAL LOW (ref 4.22–5.81)
RDW: 13.9 % (ref 11.5–15.5)
WBC: 11.5 10*3/uL — ABNORMAL HIGH (ref 4.0–10.5)
nRBC: 0 % (ref 0.0–0.2)

## 2022-11-15 LAB — PROCALCITONIN: Procalcitonin: 0.45 ng/mL

## 2022-11-15 MED ORDER — ORAL CARE MOUTH RINSE: 15.0000 mL | OROMUCOSAL | Status: DC | PRN

## 2022-11-15 MED ORDER — CHLORHEXIDINE GLUCONATE CLOTH 2 % EX PADS
6.0000 | MEDICATED_PAD | Freq: Every day | CUTANEOUS | Status: DC
  Administered 2022-11-15 – 2022-11-19 (×5): 6 via TOPICAL

## 2022-11-15 MED ORDER — FUROSEMIDE 10 MG/ML IJ SOLN
40.0000 mg | Freq: Two times a day (BID) | INTRAMUSCULAR | Status: DC
  Administered 2022-11-15 – 2022-11-16 (×2): 40 mg via INTRAVENOUS
  Filled 2022-11-15 (×2): qty 4

## 2022-11-15 MED ORDER — LIDOCAINE HCL URETHRAL/MUCOSAL 2 % EX GEL
1.0000 | Freq: Once | CUTANEOUS | Status: AC
  Administered 2022-11-15: 1 via URETHRAL
  Filled 2022-11-15: qty 6

## 2022-11-15 MED ORDER — POTASSIUM CHLORIDE CRYS ER 20 MEQ PO TBCR
40.0000 meq | EXTENDED_RELEASE_TABLET | ORAL | Status: AC
  Administered 2022-11-15 (×2): 40 meq via ORAL
  Filled 2022-11-15 (×2): qty 2

## 2022-11-15 NOTE — Assessment & Plan Note (Signed)
No statin. 

## 2022-11-15 NOTE — Assessment & Plan Note (Addendum)
Patient is hemodynamically stable, continue close monitoring. Chronic heart block, he is DNR Probably not candidate for pacemaker, considering his poor functional status and dementia.

## 2022-11-15 NOTE — Assessment & Plan Note (Signed)
Patient with no agitation

## 2022-11-15 NOTE — Assessment & Plan Note (Signed)
Continue with nutritional supplementation.  ?

## 2022-11-15 NOTE — Progress Notes (Signed)
Progress Note   Patient: David Cervantes CHY:[id number] DOB: 06-02-1927 DOA: 11/14/2022     1 DOS: the patient was seen and examined on 11/15/2022   Brief hospital course: David Cervantes was admitted to the hospital with the working diagnosis of hypoglycemia and hypokalemia  88 yo male with the past medical history of heart failure, high grade A-V block, T2DM, hypertension, pulmonary hypertension, and advance dementia who presented with altered mental status. He was found unresponsive at the SNF, his glucose was 30 and his heart rate was 40. Per his daughter he had a recent increase to his furosemide dose due to worsening lower extremity edema and dyspnea. On his initial physical examination he was hypoxemic on room air at 88%, blood pressure 103/53, HR 47, and RR 24 lungs with rales bilaterally, with increased work of breathing, heart with S1 and S2 present and bradycardic, abdomen with no distention, and positive lower extremity edema. Patient was confused and somnolent, easy to arouse.   Na 136, K 2,0 CL 98 bicarbonate 29 glucose 229, bun 15 cr 0,90  BNP 472  Wbc 14,7 hgb 9,2 plt 386  TSH 11,92  Urine analysis SG 1,013, 100 protein, 50 glucose, negative leukocytes.   Head CT with no acute changes. Positive atrophy and small vessel diease.   Chest radiograph with rotation to the left, bilateral hilar vascular congestion and cephalization of the vasculature, small bilateral pleural effusions.   EKG 51 bpm, normal axis, not prolonged qt, complete heart block with junctional scape rhythm with positive PVC, no significant ST segment or T wave changes.    Assessment and Plan: * Acute on chronic diastolic CHF (congestive heart failure) (HCC) Echocardiogram with preserved LV systolic function EF 50 to 55%, RV systolic function preserved, severe pulmonary hypertension, RVSP 62,6 mmHg. Mild dilation of the LA, mild to moderate TR.  Pulmonary hypertension Acute on chronic core  pulmonale.  Patient continue edematous, his prognosis is poor considering his advance age, pulmonary hypertension, complete heart block and poor functional status.  Resume diuresis with IV furosemide 40 mg IV q12 hrs Close follow up on blood pressure.  Patient had urinary retention and required foley cathter.   Hypokalemia Renal function with serum cr at 0.83 with K at 2,8 and serum bicarbonate at 18, Na 138,  Plan to continue diuresis with furosemide Add Kcl 80 meq in 2 divided doses and follow up renal function and electrolytes in am.   DM (diabetes mellitus) (HCC) Hypoglycemia.   Glucose has improved, patient is tolerating po well, will continue gentle glucose cover with insulin sliding scale low dose.   Complete heart block [facility]) Patient is hemodynamically stable, continue close monitoring. Chronic heart block, he is DNR  HTN (hypertension) Close blood pressure monitoring   Mixed hyperlipidemia No statin   Dementia (HCC) Patient with no agitation   Protein-calorie malnutrition, severe Continue with nutritional supplementation.         Subjective: Patient is feeling better, dyspnea has improved but continue to have edema, he has poor mobility at as outpatient  e Physical Exam: Vitals:   11/15/22 0300 11/15/22 0500 11/15/22 0900 11/15/22 1100  BP: (!) 116/46 (!) 112/46 98/79 (!) 130/95  Pulse: (!) 43 (!) 51 (!) 52 (!) 59  Resp: (!) 7 12 12 19   Temp:  98 F (36.7 C)    TempSrc:  Axillary    SpO2: 100% 100% 100% 100%  Weight:      Height:       Neurology  awake and alert ENT with mild pallor Cardiovascular with S1 and S2 present and bradycardic with no gallops, positive murmur at the right lower sternal border Respiratory with no rales or wheezing Abdomen with no distention  Positive lower extremity and upper extremity edema +++ pitting  Data Reviewed:    Family Communication: patient with poor prognosis will contact his family  Disposition: Status  is: Inpatient Remains inpatient appropriate because: heart failure   Planned Discharge Destination: Skilled nursing facility     Author: Tawni Millers, MD 11/15/2022 5:58 PM  For on call review [email or web address].

## 2022-11-15 NOTE — TOC Initial Note (Signed)
Transition of Care [facility]) - Initial/Assessment Note    Patient Details  Name: David Cervantes MRN: [id number] Date of Birth: [birth year]  Transition of Care [facility]) CM/SW Contact:    Bethann Berkshire, [place] Phone Number: 11/15/2022, 4:08 PM  Clinical Narrative:                  Pt is from [facility]. Per chart review he went there under STR medicare benefits in early October 2023. If STR, he will need auth prior to DC. Will complete fl2 and send to [facility].   Expected Discharge Plan: [place] Barriers to Discharge: Continued Medical Work up   Patient Goals and CMS Choice        Expected Discharge Plan and Services Expected Discharge Plan: [place]                                              Prior Living Arrangements/Services                       Activities of Daily Living Home Assistive Devices/Equipment: CBG Meter, Blood pressure cuff, Cane (specify quad or straight), Eyeglasses, Wheelchair, Environmental consultant (specify type), Hospital bed ADL Screening (condition at time of admission) Patient's cognitive ability adequate to safely complete daily activities?: No Is the patient deaf or have difficulty hearing?: No Does the patient have difficulty seeing, even when wearing glasses/contacts?: No Does the patient have difficulty concentrating, remembering, or making decisions?: Yes Patient able to express need for assistance with ADLs?: Yes Does the patient have difficulty dressing or bathing?: Yes Independently performs ADLs?: No Communication: Independent Dressing (OT): Needs assistance Is this a change from baseline?: Pre-admission baseline Grooming: Needs assistance Is this a change from baseline?: Pre-admission baseline Feeding: Needs assistance Is this a change from baseline?: Pre-admission baseline Bathing: Needs assistance Is this a change from baseline?: Pre-admission baseline Toileting: Needs assistance Is this a  change from baseline?: Pre-admission baseline In/Out Bed: Needs assistance Is this a change from baseline?: Pre-admission baseline Walks in Home: Needs assistance Is this a change from baseline?: Pre-admission baseline Does the patient have difficulty walking or climbing stairs?: Yes Weakness of Legs: Both Weakness of Arms/Hands: Both  Permission Sought/Granted                  Emotional Assessment              Admission diagnosis:  Hypokalemia [E87.6] CHF (congestive heart failure) ([place]) [I50.9] Hypoglycemia [E16.2] Hypoxia [R09.02] Altered mental status, unspecified altered mental status type [R41.82] Patient Active Problem List   Diagnosis Date Noted   Hypoglycemia due to type 2 diabetes mellitus ([place]) 11/14/2022   CHF (congestive heart failure) ([place]) 11/14/2022   Protein-calorie malnutrition, severe 09/13/2022   Closed right hip fracture ([employer]) 09/12/2022   Chronic diastolic CHF (congestive heart failure) ([place]) 09/12/2022   Complete heart block ([place]) 09/12/2022   Hypokalemia 09/12/2022   Normocytic anemia 09/12/2022   Dementia ([place]) 04/20/2022   Mixed hyperlipidemia 04/20/2022   Acute respiratory failure with hypoxia ([place]) 04/20/2022   DM (diabetes mellitus) ([place]) 04/20/2022   HTN (hypertension) 04/20/2022   Acute CHF ([place]) 04/20/2022   PCP:  [name], MD Pharmacy:   [facility] ([place]), [place] - [address] [place] ([place]  [address] Phone: [phone]  Fax: [phone]     Social Determinants of Health (SDOH) Interventions    Readmission Risk Interventions    09/20/2022    9:51 AM 09/15/2022   10:45 AM  Readmission Risk Prevention Plan  Transportation Screening Complete Complete  PCP or Specialist Appt within 5-7 Days  Complete  Home Care Screening  Complete  Medication Review (RN CM)  Complete  Medication Review (RN Care Manager) Complete   PCP or Specialist appointment within 3-5  days of discharge Complete   HRI or Home Care Consult Complete   SW Recovery Care/Counseling Consult Complete   Palliative Care Screening Not Applicable   Skilled Nursing Facility Complete

## 2022-11-15 NOTE — Assessment & Plan Note (Signed)
Hypoglycemia. Hyperglycemia.   Fasting glucose now is 303, with capillary 206, 270 and 206 Will add basal insulin 10 units and continue close glucose monitoring.  Patient is tolerating po well.

## 2022-11-15 NOTE — Progress Notes (Signed)
Heart Failure Navigator Progress Note  Assessed for Heart & Vascular TOC clinic readiness.  Patient does not meet criteria due to advanced dementia.   HF Navigation team will sign-off.  Ozella Rocks, MSN, RN Heart Failure Nurse Navigator

## 2022-11-15 NOTE — Assessment & Plan Note (Addendum)
Echocardiogram with preserved LV systolic function EF 50 to 55%, RV systolic function preserved, severe pulmonary hypertension, RVSP 62,6 mmHg. Mild dilation of the LA, mild to moderate TR.  Pulmonary hypertension Acute on chronic core pulmonale.  Urine output is 1,980 ml Systolic blood pressure is 111 to 128 mmHg.   Plan to increase furosemide to 60 mg IV q12  Limited pharmacologic therapy due to complete heart block and risk of hemodynamic decompensation.   Patient continue edematous, his prognosis is poor considering his advance age, pulmonary hypertension, complete heart block and poor functional status.

## 2022-11-15 NOTE — Assessment & Plan Note (Addendum)
Renal function with serum cr at 0.83 with K at 2,8 and serum bicarbonate at 18, Na 138,  Plan to continue diuresis with furosemide Add Kcl 80 meq in 2 divided doses and follow up renal function and electrolytes in am.

## 2022-11-15 NOTE — NC FL2 (Signed)
[place] MEDICAID FL2 LEVEL OF CARE FORM     IDENTIFICATION  Patient Name: Alain Deschene Birthdate: 01-27-1927 Sex: male Admission Date (Current Location): 11/14/2022  [facility] and [place] Number:  Producer, television/film/video and Address:  [facility] [place], [address] [place], [place] [address]      Provider Number: [id number]  Attending Physician Name and Address:  Coralie Keens  Relative Name and Phone Number:       Current Level of Care: Hospital Recommended Level of Care: Skilled Nursing Facility Prior Approval Number:    Date Approved/Denied:   PASRR Number: [id number]  Discharge Plan: SNF    Current Diagnoses: Patient Active Problem List   Diagnosis Date Noted   Hypoglycemia due to type 2 diabetes mellitus (HCC) 11/14/2022   CHF (congestive heart failure) (HCC) 11/14/2022   Protein-calorie malnutrition, severe 09/13/2022   Closed right hip fracture (HCC) 09/12/2022   Chronic diastolic CHF (congestive heart failure) (HCC) 09/12/2022   Complete heart block (HCC) 09/12/2022   Hypokalemia 09/12/2022   Normocytic anemia 09/12/2022   Dementia (HCC) 04/20/2022   Mixed hyperlipidemia 04/20/2022   Acute respiratory failure with hypoxia (HCC) 04/20/2022   DM (diabetes mellitus) (HCC) 04/20/2022   HTN (hypertension) 04/20/2022   Acute CHF (HCC) 04/20/2022    Orientation RESPIRATION BLADDER Height & Weight     Self, Time, Situation, Place  O2 (2L [place]) Incontinent, Indwelling catheter Weight: 133 lb 6.1 oz (60.5 kg) Height:  5\' 9"  (175.3 cm)  BEHAVIORAL SYMPTOMS/MOOD NEUROLOGICAL BOWEL NUTRITION STATUS      Continent Diet (see d/c summary)  AMBULATORY STATUS COMMUNICATION OF NEEDS Skin   Extensive Assist Verbally Normal                       Personal Care Assistance Level of Assistance  Bathing, Feeding, Dressing Bathing Assistance: Maximum assistance Feeding assistance: Independent Dressing Assistance: Maximum  assistance     Functional Limitations Info  Sight, Hearing, Speech Sight Info: Impaired Hearing Info: Impaired Speech Info: Adequate    SPECIAL CARE FACTORS FREQUENCY  OT (By licensed OT), PT (By licensed PT)     PT Frequency: 5x/week OT Frequency: 5x/week            Contractures Contractures Info: Not present    Additional Factors Info  Code Status, Allergies Code Status Info: DNR Allergies Info: Tape           Current Medications (11/15/2022):  This is the current hospital active medication list Current Facility-Administered Medications  Medication Dose Route Frequency Provider Last Rate Last Admin   0.9 %  sodium chloride infusion  250 mL Intravenous PRN 14/03/2022 T, MD       acetaminophen (TYLENOL) tablet 325-650 mg  325-650 mg Oral Q6H PRN Mikey College T, MD       acetaminophen (TYLENOL) tablet 650 mg  650 mg Oral Q4H PRN Mikey College T, MD   650 mg at 11/15/22 1503   Chlorhexidine Gluconate Cloth 2 % PADS 6 each  6 each Topical Daily Arrien, 14/04/23, MD       docusate sodium (COLACE) capsule 100 mg  100 mg Oral BID York Ram T, MD       enoxaparin (LOVENOX) injection 40 mg  40 mg Subcutaneous Q24H Mikey College T, MD   40 mg at 11/14/22 2205   hydrALAZINE (APRESOLINE) injection 2 mg  2 mg Intravenous Q6H PRN 2206, MD  insulin aspart (novoLOG) injection 0-9 Units  0-9 Units Subcutaneous TID WC Wynetta Fines T, MD   2 Units at 11/15/22 1230   magnesium oxide (MAG-OX) tablet 400 mg  400 mg Oral Daily Wynetta Fines T, MD   400 mg at 11/15/22 1121   ondansetron (ZOFRAN) injection 4 mg  4 mg Intravenous Q6H PRN Lequita Halt, MD       Oral care mouth rinse  15 mL Mouth Rinse PRN Howerter, Justin B, DO       sodium chloride flush (NS) 0.9 % injection 3 mL  3 mL Intravenous Q12H Wynetta Fines T, MD   3 mL at 11/14/22 2200   sodium chloride flush (NS) 0.9 % injection 3 mL  3 mL Intravenous PRN Lequita Halt, MD         Discharge Medications: Please see  discharge summary for a list of discharge medications.  Relevant Imaging Results:  Relevant Lab Results:   Additional Information SSN: [id number]  Bethann Berkshire, LCSW

## 2022-11-15 NOTE — Hospital Course (Signed)
David Cervantes was admitted to the hospital with the working diagnosis of hypoglycemia and hypokalemia, in the setting of heart failure decompensation.   88 yo male with the past medical history of heart failure, high grade A-V block, T2DM, hypertension, pulmonary hypertension, and dementia who presented with altered mental status. He was found unresponsive at the SNF, his glucose was 30 and his heart rate was 40. Per his daughter he had a recent increase to his furosemide dose due to worsening lower extremity edema and dyspnea. On his initial physical examination he was hypoxemic on room air at 88%, blood pressure 103/53, HR 47, and RR 24 lungs with rales bilaterally, with increased work of breathing, heart with S1 and S2 present and bradycardic, abdomen with no distention, and positive lower extremity edema. Patient was confused and somnolent, easy to arouse.   Na 136, K 2,0 CL 98 bicarbonate 29 glucose 229, bun 15 cr 0,90  BNP 472  Wbc 14,7 hgb 9,2 plt 386  TSH 11,92  Urine analysis SG 1,013, 100 protein, 50 glucose, negative leukocytes.   Head CT with no acute changes. Positive atrophy and small vessel diease.   Chest radiograph with rotation to the left, bilateral hilar vascular congestion and cephalization of the vasculature, small bilateral pleural effusions.   EKG 51 bpm, normal axis, not prolonged qt, complete heart block with junctional scape rhythm with positive PVC, no significant ST segment or T wave changes.   Patient was placed on furosemide for diuresis. Urinary retention and required foley catheter placement.   12/05 volume status is improving but not back to baseline.

## 2022-11-15 NOTE — Progress Notes (Addendum)
Patient has not urinated all shift. Patient bladder scanned, resulting >274. Attempted I&O cath but was unsuccessful due to meeting resistance, minimal bleeding, and pain from insertion attempt. Condom cath replaced, incontinence of stool cleaned, and patient now at rest. MD on call contacted regarding urine retention and failed catheterization attempt.   MD instructed assigned nurse to hold off on  attempting an additional straight cath for now. It would be ok if bladder culture and urinalysis are delayed. An order for a scheduled bladder scan for day shift with an order for a corresponding as needed straight cath was placed. Patient may need to consult with urology if retention of urine continues to be an issue.

## 2022-11-15 NOTE — Progress Notes (Signed)
TRH night cross cover note:   I was notified by RN that patient has had no urine output overnight. Bladder scan showed greater than 274 cc's. However, attempts at striaght cath unsuccessful , met with resistance, and elicited no urine output.   I've ordered repeat BS to occur at 8AM today with order for prn straight cath for PVR of > 400 cc's. UA and cx to be collected via that impending sample.     Babs Bertin, DO Hospitalist

## 2022-11-15 NOTE — Assessment & Plan Note (Signed)
Close blood pressure monitoring

## 2022-11-15 NOTE — Inpatient Diabetes Management (Signed)
Inpatient Diabetes Program Recommendations  AACE/ADA: New Consensus Statement on Inpatient Glycemic Control (2015)  Target Ranges:  Prepandial:   less than 140 mg/dL      Peak postprandial:   less than 180 mg/dL (1-2 hours)      Critically ill patients:  140 - 180 mg/dL    Latest Reference Range & Units 04/23/22 03:58 09/12/22 12:59  Hemoglobin A1C 4.8 - 5.6 % 9.1 (H) 9.8 (H)  234 mg/dl  (H): Data is abnormally high  Latest Reference Range & Units 11/14/22 11:08 11/14/22 11:13 11/14/22 11:39 11/14/22 12:33 11/14/22 13:15 11/14/22 15:12 11/14/22 16:08  Glucose-Capillary 70 - 99 mg/dL 086 (H) 578 (H) 85 46 (L)  50 ml D50% 108 (H) 67 (L)  50 ml D50% 108 (H)  (H): Data is abnormally high (L): Data is abnormally low  Latest Reference Range & Units 11/14/22 18:29 11/14/22 19:50 11/14/22 21:03  Glucose-Capillary 70 - 99 mg/dL 87 91 94    Latest Reference Range & Units 11/15/22 05:51 11/15/22 11:32  Glucose-Capillary 70 - 99 mg/dL 469 (H)  7 units Novolog  176 (H)  (H): Data is abnormally high  Admit from SNF with AMS/ Hypoglycemia/ Hypokalemia/ Acute on chronic combined HFrEF HFpEF decompensation   History: DM  Home DM Meds: Lantus 8 units AM/ 5 units PM        Metformin 500 mg daily  Current Orders: Novolog Sensitive Correction Scale/ SSI (0-9 units) TID AC     Note CBG up to 305 this AM  Novolog started at 6am--CBG down to 176 pre-lunch  Will follow    --Will follow patient during hospitalization--  Ambrose Finland RN, MSN, CDCES Diabetes Coordinator Inpatient Glycemic Control Team Team Pager: [phone] (8a-5p)

## 2022-11-15 NOTE — Consult Note (Signed)
   [facility] CM Inpatient Consult   11/15/2022  David Cervantes December 26, 1926 [id number]  [place] Organization [ACO] Patient: [employer] Medicare Choice PPO  Primary Care Provider:  [name], MD, [facility]   Patient screened and discussed in unit progression meeting for hospitalization with noted  Review of patient's electronic medical record reveals patient is from a skilled nursing facility level of care   Plan:  No needs for [place] as currently needs are to be met at a skilled nursing facility level of care at transition.  Of note, [facility] Care Management/Population Health does not replace or interfere with any arrangements made by the Inpatient Transition of Care team.  For questions contact:   Natividad Brood, RN BSN [place]  [phone] business mobile phone Toll free office [phone]  *[place]  [phone] Fax number: [phone] [name]

## 2022-11-15 NOTE — Assessment & Plan Note (Signed)
Glucose has improved up to 240, continue close monitoring, hold on further insulin therapy for now.

## 2022-11-16 DIAGNOSIS — N179 Acute kidney failure, unspecified: Secondary | ICD-10-CM

## 2022-11-16 DIAGNOSIS — E0800 Diabetes mellitus due to underlying condition with hyperosmolarity without nonketotic hyperglycemic-hyperosmolar coma (NKHHC): Secondary | ICD-10-CM | POA: Diagnosis not present

## 2022-11-16 DIAGNOSIS — E43 Unspecified severe protein-calorie malnutrition: Secondary | ICD-10-CM | POA: Diagnosis not present

## 2022-11-16 DIAGNOSIS — I5033 Acute on chronic diastolic (congestive) heart failure: Secondary | ICD-10-CM | POA: Diagnosis not present

## 2022-11-16 LAB — GLUCOSE, CAPILLARY
Glucose-Capillary: 270 mg/dL — ABNORMAL HIGH (ref 70–99)
Glucose-Capillary: 206 mg/dL — ABNORMAL HIGH (ref 70–99)
Glucose-Capillary: 151 mg/dL — ABNORMAL HIGH (ref 70–99)
Glucose-Capillary: 103 mg/dL — ABNORMAL HIGH (ref 70–99)

## 2022-11-16 LAB — BASIC METABOLIC PANEL
Anion gap: 8 (ref 5–15)
BUN: 20 mg/dL (ref 8–23)
CO2: 27 mmol/L (ref 22–32)
Calcium: 7 mg/dL — ABNORMAL LOW (ref 8.9–10.3)
Chloride: 101 mmol/L (ref 98–111)
Creatinine, Ser: 1.31 mg/dL — ABNORMAL HIGH (ref 0.61–1.24)
GFR, Estimated: 51 mL/min — ABNORMAL LOW (ref >60)
Glucose, Bld: 303 mg/dL — ABNORMAL HIGH (ref 70–99)
Potassium: 4.3 mmol/L (ref 3.5–5.1)
Sodium: 136 mmol/L (ref 135–145)

## 2022-11-16 LAB — URINE CULTURE: Culture: NO GROWTH

## 2022-11-16 LAB — MAGNESIUM: Magnesium: 1.5 mg/dL — ABNORMAL LOW (ref 1.7–2.4)

## 2022-11-16 MED ORDER — MAGNESIUM SULFATE 4 GM/100ML IV SOLN
4.0000 g | Freq: Once | INTRAVENOUS | Status: AC
  Administered 2022-11-16: 4 g via INTRAVENOUS
  Filled 2022-11-16: qty 100

## 2022-11-16 MED ORDER — FUROSEMIDE 10 MG/ML IJ SOLN
60.0000 mg | Freq: Two times a day (BID) | INTRAMUSCULAR | Status: DC
  Administered 2022-11-16 – 2022-11-19 (×6): 60 mg via INTRAVENOUS
  Filled 2022-11-16 (×6): qty 6

## 2022-11-16 MED ORDER — INSULIN GLARGINE-YFGN 100 UNIT/ML ~~LOC~~ SOLN
10.0000 [IU] | Freq: Every day | SUBCUTANEOUS | Status: DC
  Administered 2022-11-16: 10 [IU] via SUBCUTANEOUS
  Filled 2022-11-16 (×2): qty 0.1

## 2022-11-16 NOTE — Progress Notes (Signed)
Progress Note   Patient: David Cervantes [id number] DOB: 1927/09/03 DOA: 11/14/2022     2 DOS: the patient was seen and examined on 11/16/2022   Brief hospital course: Mr. Violett was admitted to the hospital with the working diagnosis of hypoglycemia and hypokalemia, in the setting of heart failure decompensation.   88 yo male with the past medical history of heart failure, high grade A-V block, T2DM, hypertension, pulmonary hypertension, and dementia who presented with altered mental status. He was found unresponsive at the SNF, his glucose was 30 and his heart rate was 40. Per his daughter he had a recent increase to his furosemide dose due to worsening lower extremity edema and dyspnea. On his initial physical examination he was hypoxemic on room air at 88%, blood pressure 103/53, HR 47, and RR 24 lungs with rales bilaterally, with increased work of breathing, heart with S1 and S2 present and bradycardic, abdomen with no distention, and positive lower extremity edema. Patient was confused and somnolent, easy to arouse.   Na 136, K 2,0 CL 98 bicarbonate 29 glucose 229, bun 15 cr 0,90  BNP 472  Wbc 14,7 hgb 9,2 plt 386  TSH 11,92  Urine analysis SG 1,013, 100 protein, 50 glucose, negative leukocytes.   Head CT with no acute changes. Positive atrophy and small vessel diease.   Chest radiograph with rotation to the left, bilateral hilar vascular congestion and cephalization of the vasculature, small bilateral pleural effusions.   EKG 51 bpm, normal axis, not prolonged qt, complete heart block with junctional scape rhythm with positive PVC, no significant ST segment or T wave changes.   Patient was placed on furosemide for diuresis. Urinary retention and required foley catheter placement.   12/05 volume status is improving but not back to baseline.   Assessment and Plan: * Acute on chronic diastolic CHF (congestive heart failure) (HCC) Echocardiogram with preserved LV systolic  function EF 50 to 55%, RV systolic function preserved, severe pulmonary hypertension, RVSP 62,6 mmHg. Mild dilation of the LA, mild to moderate TR.  Pulmonary hypertension Acute on chronic core pulmonale.  Urine output is 1,980 ml Systolic blood pressure is 111 to 128 mmHg.   Plan to increase furosemide to 60 mg IV q12  Limited pharmacologic therapy due to complete heart block and risk of hemodynamic decompensation.   Patient continue edematous, his prognosis is poor considering his advance age, pulmonary hypertension, complete heart block and poor functional status.   AKI (acute kidney injury) (HCC) Hypokalemia and hypomagnesemia.  Renal function with serum cr at 1,31 with K at 4,3 and serum bicarbonate at 27, Na is 136 and Mg 1.5  Plan to continue diuresis with IV furosemide for hypervolemia. Add 4 g Mag sulfate IV and follow up electrolytes in am. Avoid hypotension and nephrotoxic medications.,   DM (diabetes mellitus) (HCC) Hypoglycemia. Hyperglycemia.   Fasting glucose now is 303, with capillary 206, 270 and 206 Will add basal insulin 10 units and continue close glucose monitoring.  Patient is tolerating po well.    Complete heart block [facility]) Patient is hemodynamically stable, continue close monitoring. Chronic heart block, he is DNR Probably not candidate for pacemaker, considering his poor functional status and dementia.   HTN (hypertension) Close blood pressure monitoring   Mixed hyperlipidemia No statin   Dementia (HCC) Patient with no agitation   Protein-calorie malnutrition, severe Continue with nutritional supplementation.         Subjective: Patient with no chest pain, dyspnea has bee improving,  continue to have edema   Physical Exam: Vitals:   11/16/22 0900 11/16/22 1000 11/16/22 1100 11/16/22 1200  BP: (!) 123/43 119/66 (!) 120/48 (!) 128/44  Pulse:      Resp: (!) 0 11 (!) 0 (!) 0  Temp:      TempSrc:      SpO2:      Weight:      Height:        Neurology awake and alert ENT with mild pallor Cardiovascular with S1 and S2 present and rhythmic, bradycardic Respiratory with rales at bases with no wheezing or rhonchi Abdomen with no distention  Positive lower and upper extremity edema +++ pitting  Data Reviewed:    Family Communication: no family at the bedside   Disposition: Status is: Inpatient Remains inpatient appropriate because: IV diuresis   Planned Discharge Destination: Skilled nursing facility     Author: Coralie Keens, MD 11/16/2022 12:53 PM  For on call review [email or web address].

## 2022-11-16 NOTE — Progress Notes (Incomplete)
PROGRESS NOTE    David Cervantes  YWV:[id number] DOB: May 26, 1927 DOA: 11/14/2022 PCP: Georgann Housekeeper, MD   89/M w/ history of chronic diastolic CHF, pulmonary hypertension, high-grade AV block, type 2 diabetes mellitus, dementia, resident of SNF was found unresponsive at his facility with a glucose of 30, heart rate in the 40s, history of recent increase in diuretic dose for worsening edema and dyspnea, in the ED was not noted to be tachypneic with edema, -In the emergency room labs with potassium of 2.0, creatinine 0.9, BNP 472, CT head unremarkable, chest x-ray with bilateral pulmonary vascular congestion and small pleural effusions, EKG with complete heart block, junctional escape rhythm with PVCs  Subjective: Patient feels better, breathing improving  Assessment and Plan:   Acute on chronic diastolic CHF  Pulmonary hypertension Severe Hypoalbuminemia -Echo with EF 50-55%, RV function is preserved, severe pulmonary hypertension  -Treated conservatively with diuretics only, poor overall prognosis with longstanding advanced heart block, dementia, poor functional status, albumin of 1.5 -Continue IV Lasix today, add 2 doses of albumin -will consult palliative care eval, d/w David Cervantes regarding hospice  Recent Hip fracture -surgery in October, has been bedbound in an SNF since then -PT/OT  AKI (acute kidney injury) (HCC) Hypokalemia and hypomagnesemia. -Electrolytes repleted, monitor  DM (diabetes mellitus) (HCC) Hypoglycemia. Hyperglycemia.  -Started on long-acting insulin, continue sliding scale insulin -decrease dose, add meal coverage  Complete heart block (HCC) Patient is hemodynamically stable, continue close monitoring. Chronic advanced AV block, he is DNR - not a good candidate for pacemaker, considering his poor functional status and dementia.  -palliative consult  HTN (hypertension) -stable  Mixed hyperlipidemia No statin   Dementia (HCC) -Now  stable  Protein-calorie malnutrition, severe Continue with nutritional supplementation.   Urinary retention -foley placed this admission 12/3  DVT prophylaxis:lovenox Code Status: DNR Family Communication: David Cervantes at bedside Disposition Plan: SNF, possibly with hospice  Consultants: Palliative care   Procedures:   Antimicrobials:    Objective: Vitals:   11/17/22 0138 11/17/22 0139 11/17/22 0140 11/17/22 0141  BP:      Pulse:      Resp: (!) 5 (!) 22 (!) 9 (!) 8  Temp:      TempSrc:      SpO2:      Weight:      Height:        Intake/Output Summary (Last 24 hours) at 11/17/2022 0546 Last data filed at 11/16/2022 2300 Gross per 24 hour  Intake 496.37 ml  Output 1225 ml  Net -728.63 ml   Filed Weights   11/14/22 2056 11/15/22 0255 11/16/22 0400  Weight: 60.5 kg 60.5 kg 59 kg    Examination:      Data Reviewed:   CBC: Recent Labs  Lab 11/14/22 1113 11/14/22 1118 11/14/22 1126 11/15/22 0107  WBC 14.7*  --   --  11.5*  NEUTROABS 12.8*  --   --   --   HGB 9.1* 9.2* 8.8* 7.9*  HCT 28.1* 27.0* 26.0* 23.2*  MCV 94.0  --   --  89.2  PLT 386  --   --  329   Basic Metabolic Panel: Recent Labs  Lab 11/14/22 1113 11/14/22 1118 11/14/22 1126 11/15/22 0107 11/16/22 0134 11/17/22 0104  NA 136 139 138 138 136 140  K 2.0* <2.0* 2.3* 2.8* 4.3 3.9  CL 98  --  95* 100 101 100  CO2 29  --   --  28 27 33*  GLUCOSE 229*  --  198*  240* 303* 46*  BUN 15  --  19 15 20 20   CREATININE 0.90  --  0.80 0.83 1.31* 1.06  CALCIUM 6.8*  --   --  7.1* 7.0* 7.8*  MG  --   --   --   --  1.5*  --    GFR: Estimated Creatinine Clearance: 36.3 mL/min (by C-G formula based on SCr of 1.06 mg/dL). Liver Function Tests: Recent Labs  Lab 11/14/22 1113  AST 15  ALT 16  ALKPHOS 126  BILITOT 0.2*  PROT 5.3*  ALBUMIN 1.6*   Recent Labs  Lab 11/14/22 1113  LIPASE 22   Recent Labs  Lab 11/14/22 1114  AMMONIA 39*   Coagulation Profile: No results for input(s):  "INR", "PROTIME" in the last 168 hours. Cardiac Enzymes: No results for input(s): "CKTOTAL", "CKMB", "CKMBINDEX", "TROPONINI" in the last 168 hours. BNP (last 3 results) No results for input(s): "PROBNP" in the last 8760 hours. HbA1C: No results for input(s): "HGBA1C" in the last 72 hours. CBG: Recent Labs  Lab 11/15/22 2115 11/16/22 0612 11/16/22 1125 11/16/22 1631 11/16/22 2127  GLUCAP 206* 270* 206* 151* 103*   Lipid Profile: No results for input(s): "CHOL", "HDL", "LDLCALC", "TRIG", "CHOLHDL", "LDLDIRECT" in the last 72 hours. Thyroid Function Tests: Recent Labs    11/14/22 1113  TSH 11.932*   Anemia Panel: No results for input(s): "VITAMINB12", "FOLATE", "FERRITIN", "TIBC", "IRON", "RETICCTPCT" in the last 72 hours. Urine analysis:    Component Value Date/Time   COLORURINE YELLOW 11/14/2022 1034   APPEARANCEUR HAZY (A) 11/14/2022 1034   LABSPEC 1.013 11/14/2022 1034   PHURINE 5.0 11/14/2022 1034   GLUCOSEU 50 (A) 11/14/2022 1034   HGBUR NEGATIVE 11/14/2022 1034   BILIRUBINUR NEGATIVE 11/14/2022 1034   KETONESUR NEGATIVE 11/14/2022 1034   PROTEINUR 100 (A) 11/14/2022 1034   NITRITE NEGATIVE 11/14/2022 1034   LEUKOCYTESUR NEGATIVE 11/14/2022 1034   Sepsis Labs: @LABRCNTIP (procalcitonin:4,lacticidven:4)  ) Recent Results (from the past 240 hour(s))  Urine Culture     Status: None   Collection Time: 11/14/22 11:13 AM   Specimen: Urine, Clean Catch  Result Value Ref Range Status   Specimen Description URINE, CLEAN CATCH  Final   Special Requests NONE  Final   Culture   Final    NO GROWTH Performed at [facility], [place] [address] [place], [place] [address]    Report Status 11/16/2022 FINAL  Final     Radiology Studies: No results found.   Scheduled Meds:  Chlorhexidine Gluconate Cloth  6 each Topical Daily   docusate sodium  100 mg Oral BID   enoxaparin (LOVENOX) injection  40 mg Subcutaneous Q24H   furosemide  60 mg Intravenous Q12H    insulin aspart  0-9 Units Subcutaneous TID WC   insulin glargine-yfgn  10 Units Subcutaneous Daily   sodium chloride flush  3 mL Intravenous Q12H   Continuous Infusions:  sodium chloride       LOS: 3 days    Time spent:48min    Domenic Polite, MD [facility]   11/17/2022, 5:46 AM

## 2022-11-16 NOTE — Plan of Care (Signed)
  Problem: Education: Goal: Ability to describe self-care measures that may prevent or decrease complications (Diabetes Survival Skills Education) will improve Outcome: Progressing Goal: Individualized Educational Video(s) Outcome: Progressing   Problem: Coping: Goal: Ability to adjust to condition or change in health will improve Outcome: Progressing   Problem: Fluid Volume: Goal: Ability to maintain a balanced intake and output will improve Outcome: Progressing   Problem: Health Behavior/Discharge Planning: Goal: Ability to identify and utilize available resources and services will improve Outcome: Progressing Goal: Ability to manage health-related needs will improve Outcome: Progressing   Problem: Metabolic: Goal: Ability to maintain appropriate glucose levels will improve Outcome: Progressing   Problem: Nutritional: Goal: Maintenance of adequate nutrition will improve Outcome: Progressing Goal: Progress toward achieving an optimal weight will improve Outcome: Progressing   Problem: Skin Integrity: Goal: Risk for impaired skin integrity will decrease Outcome: Progressing   Problem: Tissue Perfusion: Goal: Adequacy of tissue perfusion will improve Outcome: Progressing   Problem: Education: Goal: Knowledge of General Education information will improve Description: Including pain rating scale, medication(s)/side effects and non-pharmacologic comfort measures Outcome: Progressing   Problem: Health Behavior/Discharge Planning: Goal: Ability to manage health-related needs will improve Outcome: Progressing   Problem: Clinical Measurements: Goal: Ability to maintain clinical measurements within normal limits will improve Outcome: Progressing Goal: Will remain free from infection Outcome: Progressing Goal: Diagnostic test results will improve Outcome: Progressing Goal: Respiratory complications will improve Outcome: Progressing Goal: Cardiovascular complication will  be avoided Outcome: Progressing   Problem: Activity: Goal: Risk for activity intolerance will decrease Outcome: Progressing   Problem: Nutrition: Goal: Adequate nutrition will be maintained Outcome: Progressing   Problem: Coping: Goal: Level of anxiety will decrease Outcome: Progressing   Problem: Elimination: Goal: Will not experience complications related to bowel motility Outcome: Progressing Goal: Will not experience complications related to urinary retention Outcome: Progressing   Problem: Pain Managment: Goal: General experience of comfort will improve Outcome: Progressing   Problem: Safety: Goal: Ability to remain free from injury will improve Outcome: Progressing   Problem: Skin Integrity: Goal: Risk for impaired skin integrity will decrease Outcome: Progressing   Problem: Education: Goal: Ability to demonstrate management of disease process will improve Outcome: Progressing Goal: Ability to verbalize understanding of medication therapies will improve Outcome: Progressing Goal: Individualized Educational Video(s) Outcome: Progressing   Problem: Activity: Goal: Capacity to carry out activities will improve Outcome: Progressing   Problem: Cardiac: Goal: Ability to achieve and maintain adequate cardiopulmonary perfusion will improve Outcome: Progressing   

## 2022-11-17 DIAGNOSIS — E876 Hypokalemia: Secondary | ICD-10-CM | POA: Diagnosis not present

## 2022-11-17 DIAGNOSIS — Z789 Other specified health status: Secondary | ICD-10-CM

## 2022-11-17 DIAGNOSIS — E162 Hypoglycemia, unspecified: Secondary | ICD-10-CM | POA: Diagnosis not present

## 2022-11-17 DIAGNOSIS — R0902 Hypoxemia: Secondary | ICD-10-CM

## 2022-11-17 DIAGNOSIS — R4182 Altered mental status, unspecified: Secondary | ICD-10-CM

## 2022-11-17 DIAGNOSIS — Z515 Encounter for palliative care: Secondary | ICD-10-CM

## 2022-11-17 DIAGNOSIS — Z7189 Other specified counseling: Secondary | ICD-10-CM

## 2022-11-17 DIAGNOSIS — Z66 Do not resuscitate: Secondary | ICD-10-CM

## 2022-11-17 DIAGNOSIS — I5033 Acute on chronic diastolic (congestive) heart failure: Secondary | ICD-10-CM | POA: Diagnosis not present

## 2022-11-17 LAB — GLUCOSE, CAPILLARY
Glucose-Capillary: 39 mg/dL — CL (ref 70–99)
Glucose-Capillary: 94 mg/dL (ref 70–99)
Glucose-Capillary: 112 mg/dL — ABNORMAL HIGH (ref 70–99)
Glucose-Capillary: 90 mg/dL (ref 70–99)
Glucose-Capillary: 139 mg/dL — ABNORMAL HIGH (ref 70–99)

## 2022-11-17 LAB — BASIC METABOLIC PANEL
Anion gap: 7 (ref 5–15)
BUN: 20 mg/dL (ref 8–23)
CO2: 33 mmol/L — ABNORMAL HIGH (ref 22–32)
Calcium: 7.8 mg/dL — ABNORMAL LOW (ref 8.9–10.3)
Chloride: 100 mmol/L (ref 98–111)
Creatinine, Ser: 1.06 mg/dL (ref 0.61–1.24)
GFR, Estimated: >60 mL/min (ref >60)
Glucose, Bld: 46 mg/dL — ABNORMAL LOW (ref 70–99)
Potassium: 3.9 mmol/L (ref 3.5–5.1)
Sodium: 140 mmol/L (ref 135–145)

## 2022-11-17 MED ORDER — ALBUMIN HUMAN 25 % IV SOLN
25.0000 g | Freq: Four times a day (QID) | INTRAVENOUS | Status: AC
  Administered 2022-11-17 (×2): 25 g via INTRAVENOUS
  Filled 2022-11-17 (×2): qty 100

## 2022-11-17 MED ORDER — ENOXAPARIN SODIUM 40 MG/0.4ML IJ SOSY: 40.0000 mg | PREFILLED_SYRINGE | Freq: Two times a day (BID) | INTRAMUSCULAR | Status: DC

## 2022-11-17 MED ORDER — INSULIN ASPART 100 UNIT/ML IJ SOLN
2.0000 [IU] | Freq: Three times a day (TID) | INTRAMUSCULAR | Status: DC
  Administered 2022-11-17 – 2022-11-19 (×4): 2 [IU] via SUBCUTANEOUS

## 2022-11-17 MED ORDER — ENOXAPARIN SODIUM 40 MG/0.4ML IJ SOSY
40.0000 mg | PREFILLED_SYRINGE | INTRAMUSCULAR | Status: DC
  Administered 2022-11-17 – 2022-11-18 (×2): 40 mg via SUBCUTANEOUS
  Filled 2022-11-17 (×2): qty 0.4

## 2022-11-17 MED ORDER — INSULIN GLARGINE-YFGN 100 UNIT/ML ~~LOC~~ SOLN
6.0000 [IU] | Freq: Every day | SUBCUTANEOUS | Status: DC
  Administered 2022-11-17 – 2022-11-19 (×3): 6 [IU] via SUBCUTANEOUS
  Filled 2022-11-17 (×3): qty 0.06

## 2022-11-17 MED ORDER — INSULIN ASPART 100 UNIT/ML IJ SOLN
0.0000 [IU] | Freq: Three times a day (TID) | INTRAMUSCULAR | Status: DC
  Administered 2022-11-18: 1 [IU] via SUBCUTANEOUS
  Administered 2022-11-18 – 2022-11-19 (×2): 2 [IU] via SUBCUTANEOUS

## 2022-11-17 NOTE — Progress Notes (Signed)
Hypoglycemic Event  CBG: 39  Treatment: 8 oz juice/soda  Symptoms: None  Follow-up CBG: [id number]:[id number] CBG Result:94  Possible Reasons for Event: Inadequate meal intake  Comments/MD notified:no    Crista Curb, Mariann Barter

## 2022-11-17 NOTE — Progress Notes (Signed)
Civil engineer, contracting [facility]) Hospital Liaison Note  Referral received for patient/family interest in hospice at LTC. Chart under review by [facility] Physician.   Hospice eligibility pending.   Plan is to discharge to [facility] once financials have been straightened out.   No DME needed at this time.   Please send patient with comfort medications/prescriptions at discharge. Please call with any questions or concerns. Thank you  Dionicio Stall, [facility] Liaison [phone]

## 2022-11-17 NOTE — Consult Note (Signed)
Consultation Note Date: 11/17/2022   Patient Name: David Cervantes  DOB: 08-13-1927  MRN: [id number]  Age / Sex: 88 y.o., male  PCP: David Low, MD Referring Physician: Domenic Polite, MD  Reason for Consultation: Establishing goals of care  HPI/Patient Profile: 88 y.o. male  with past medical history of  chronic HFpEF, high-grade AV block, IDDM, HTN, pulmonary hypertension, and advanced dementia presented to ED on 11/14/22 from [place] with respiratory distress and AMS. Patient was admitted on 11/14/2022 with acute metabolic encephalopathy, severe hypokalemia, acute on chronic combined HF decompensation, hypocalcemia, chronic high-grade AV block.   ED Course: Patient was found in bradycardia, monitor showed third-degree AV block, blood pressure borderline Cervantes hypoxic 88% on room air.  Glucose 40s was given 2 rounds of D50 and remained borderline Cervantes.  Chest x-ray showed pulmonary congestion and left-sided pleural effusion.  K2.0.  Creatinine 1.0.   Clinical Assessment and Goals of Care: I have reviewed medical records including EPIC notes, labs, and imaging. Received report from primary RN - no acute concerns. RN reports patient is confused, eating/drinking well.  Went to visit patient at bedside - daughter/David present. Patient was lying in bed asleep - he does not wake to voice/gentle touch. No signs or non-verbal gestures of pain or discomfort noted. No respiratory distress, increased work of breathing, or secretions noted.   Met with daughter  to discuss diagnosis, prognosis, GOC, EOL wishes, disposition, and options.  I introduced Palliative Medicine as specialized medical care for people living with serious illness. It focuses on providing relief from the symptoms and stress of a serious illness. The goal is to improve quality of life for both the patient and the family.  We discussed a brief life  review of the patient as well as functional and nutritional status. Patient is widowed - they had one daughter and one son; his son has passed away. As of 04/07/22, patient was living at home alone, ambulating independently, until he fell. David Cervantes moved in with him at this time. Since that fall in 04-08-23, patient became increasingly weak with multiple concurrent falls and hospitalizations. In October 2023, patient fell and broke his femur subsequently undergoing surgery. He was discharged to [facility] rehab. David Cervantes tells me patient "hasn't gotten much benefit from rehab" and "he was not interested in doing much there." He has a hard time standing on his own now. He also caught COVID several weeks ago, which she notes as another factor to his decline. David Cervantes does tell me patient has a "veracious appetite."  Albumin noted at 1.6 on 11/14/22 despite eating well per daughter.   We discussed patient's current illness and what it means in the larger context of patient's on-going co-morbidities. Education provided that dementia and CHF are progressive, non-curable disease underlying the patient's current acute medical conditions. David Cervantes has a clear understanding of patient's current acute medical situation. We discussed his overall poor prognosis given age, comorbidities, poor functional status. We reviewed that he likely is not a good candidate for PPM and has limited pharmacological therapy options due to his heart block and risk for decompensation. Natural disease trajectory and expectations at EOL were discussed. I attempted to elicit values and goals of care important to the patient. The difference between aggressive medical intervention (continuing PT/OT, seeing EP for possible PPM, rehospitalization) and comfort care was considered in light of the patient's goals of care. David Cervantes understands patient is at high risk for rehospitalization - she would not want him rehospitalized once discharged.  Provided education and  counseling at length on the philosophy and benefits of hospice care. Discussed that it offers a holistic approach to care in the setting of end-stage illness, and is about supporting the patient where they are allowing nature to take it's course. Discussed the hospice team includes RNs, physicians, social workers, and chaplains. They can provide personal care, support for the family, and help keep patient out of the hospital as well as assist with DME needs for home hospice. Education provided on the difference between home vs residential hospice.   Discussion on the difference between Palliative and Hospice care. Palliative care and hospice have similar goals of managing symptoms, promoting comfort, improving quality of life, and maintaining a person's dignity. However, palliative care may be offered during any phase of a serious illness, while hospice care is usually offered when a person is expected to live for 6 months or less.  David Cervantes is most interested in home hospice for patient at discharge; however, she is clear that she would not be able to care for patient at home and cannot pay for private duty caregivers. We discussed option of LTC with hospice - she is most interested in this option if insurance/finances can support.   DNR/DNI confirmed.   For now while admitted, goal is to treat the treatable for medical optimization.   Discussed with David Cervantes the importance of continued conversation with the medical providers regarding overall plan of care and treatment options, ensuring decisions are within the context of the patient's values and GOCs.    Questions and concerns were addressed. The patient/family was encouraged to call with questions and/or concerns. PMT card was provided.   Primary Decision Maker: NEXT OF KIN - daughter/David Cervantes    SUMMARY OF RECOMMENDATIONS   Continue to treat the treatable for medical optimization DNR/DNI confirmed - durable DNR form completed and placed in shadow  chart. Copy was made and will be scanned into Vynca/ACP tab Goal at discharge is LTC placement with hospice pending insurance/financial obligation - TOC notified and consult placed Goal is for patient not to be rehospitalized after discharge PMT will continue to follow and support holistically   Code Status/Advance Care Planning: DNR  Palliative Prophylaxis:  Aspiration, Delirium Protocol, Frequent Pain Assessment, Oral Care, and Turn Reposition  Additional Recommendations (Limitations, Scope, Preferences): Avoid Hospitalization, Full Scope Treatment, and No Tracheostomy  Psycho-social/Spiritual:  Desire for further Chaplaincy support:no Created space and opportunity for patient and family to express thoughts and feelings regarding patient's current medical situation.  Emotional support and therapeutic listening provided.  Prognosis:  < 6 months would not be surprising if goal is no rehospitalization  Discharge Planning: [place] with Hospice      Primary Diagnoses: Present on Admission:  AKI (acute kidney injury) ([place])  Acute on chronic diastolic CHF (congestive heart failure) (HCC)  Dementia (HCC)  Mixed hyperlipidemia  HTN (hypertension)  Complete heart block (HCC)  Protein-calorie malnutrition, severe   I have reviewed the medical record, interviewed the patient and family, and examined the patient. The following aspects are pertinent.  Past Medical History:  Diagnosis Date   Diabetes mellitus without complication ([place])    Social History   Socioeconomic History   Marital status: Unknown    Spouse name: Not on file   Number of children: Not on file   Years of education: Not on file   Highest education level: Not on file  Occupational History   Not on file  Tobacco Use  Smoking status: Never    Passive exposure: Never   Smokeless tobacco: Not on file  Vaping Use   Vaping Use: Never used  Substance and Sexual Activity   Alcohol use: Not  Currently   Drug use: Never   Sexual activity: Not Currently  Other Topics Concern   Not on file  Social History Narrative   Not on file   Social Determinants of Health   Financial Resource Strain: Not on file  Food Insecurity: No Food Insecurity (11/15/2022)   Hunger Vital Sign    Worried About Running Out of Food in the Last Year: Never true    Ran Out of Food in the Last Year: Never true  Transportation Needs: No Transportation Needs (11/15/2022)   PRAPARE - Hydrologist (Medical): No    Lack of Transportation (Non-Medical): No  Physical Activity: Not on file  Stress: Not on file  Social Connections: Not on file   History reviewed. No pertinent family history. Scheduled Meds:  Chlorhexidine Gluconate Cloth  6 each Topical Daily   docusate sodium  100 mg Oral BID   enoxaparin (LOVENOX) injection  40 mg Subcutaneous Q24H   furosemide  60 mg Intravenous Q12H   insulin aspart  0-9 Units Subcutaneous TID WC   insulin aspart  2 Units Subcutaneous TID WC   insulin glargine-yfgn  6 Units Subcutaneous Daily   sodium chloride flush  3 mL Intravenous Q12H   Continuous Infusions:  sodium chloride     PRN Meds:.sodium chloride, acetaminophen, acetaminophen, ondansetron (ZOFRAN) IV, mouth rinse, sodium chloride flush Medications Prior to Admission:  Prior to Admission medications   Medication Sig Start Date End Date Taking? Authorizing Provider  acetaminophen (TYLENOL) 325 MG tablet Take 1-2 tablets (325-650 mg total) by mouth every 6 (six) hours as needed for mild pain (pain score 1-3 or temp > 100.5). 09/16/22  Yes Barb Merino, MD  aspirin 81 MG chewable tablet Chew 81 mg by mouth daily.   Yes [provider]  b complex vitamins capsule Take 1 capsule by mouth every other day.   Yes [provider]  bisacodyl (DULCOLAX) 10 MG suppository Place 10 mg rectally as needed for moderate constipation. If not relieved by milk of magnesia, give  10 mg bisacdoyl suppository rectally x 1 dose in 24 hours as needed.   Yes [provider]  cholecalciferol (VITAMIN D3) 25 MCG (1000 UNIT) tablet Take 1,000 Units by mouth every Monday, Wednesday, and Friday.   Yes [provider]  cyanocobalamin (VITAMIN B12) 1000 MCG tablet Take 1,000 mcg by mouth every other day.   Yes [provider]  docusate sodium (COLACE) 100 MG capsule Take 1 capsule (100 mg total) by mouth 2 (two) times daily. 09/16/22  Yes Barb Merino, MD  furosemide (LASIX) 40 MG tablet Take 40 mg by mouth daily.   Yes [provider]  guaiFENesin (ROBITUSSIN) 100 MG/5ML liquid Take 10 mLs by mouth every 4 (four) hours as needed for cough.   Yes [provider]  LANTUS SOLOSTAR 100 UNIT/ML Solostar Pen Inject 8 Units into the skin daily before breakfast. Patient taking differently: Inject 5-8 Units into the skin See admin instructions. Inject 8 units subcutaneously daily before breakfast. Inject 5 units subcutaneously at bedtime. Prime pen with 2 units prior to each use. 09/16/22  Yes Barb Merino, MD  magnesium hydroxide (MILK OF MAGNESIA) 400 MG/5ML suspension Take 30 mLs by mouth daily as needed for mild constipation.  If no BM in three days, give 30 mL by mouth x 1 dose in 24 hours as needed.   Yes [provider]  magnesium oxide (MAG-OX) 400 MG tablet Take 1 tablet (400 mg total) by mouth daily. 05/25/22  Yes Donato Heinz, MD  melatonin 3 MG TABS tablet Take 3 mg by mouth at bedtime.   Yes [provider]  metFORMIN (GLUCOPHAGE-XR) 500 MG 24 hr tablet Take 500 mg by mouth daily with breakfast. 01/05/22  Yes [provider]  NON FORMULARY Take 1 Container by mouth daily. Give magic cup by mouth once daily.   Yes [provider]  OXYGEN Inhale 2 L/min into the lungs every evening.   Yes [provider]  potassium chloride SA (KLOR-CON M) 20 MEQ tablet Take 20 mEq by mouth daily.   Yes  [provider]  ramipril (ALTACE) 2.5 MG capsule Take 2.5 mg by mouth in the morning. 01/05/22  Yes [provider]  Sodium Phosphates (RA SALINE ENEMA RE) Place 1 enema rectally as needed (constipation). If not relieved by bisacodyl suppository, give disposable saline enema rectally x 1 dose/24 hours as needed.   Yes [provider]  vitamin C (ASCORBIC ACID) 500 MG tablet Take 500 mg by mouth daily.   Yes [provider]  Zinc 50 MG TABS Take 50 mg by mouth 2 (two) times daily.   Yes [provider]   Allergies  Allergen Reactions   Tape Other (See Comments)    SKIN TEARS AND BRUISES VERY EASILY!!   Review of Systems  Unable to perform ROS: Mental status change    Physical Exam Vitals and nursing note reviewed.  Constitutional:      General: He is not in acute distress.    Appearance: He is ill-appearing.  Pulmonary:     Effort: No respiratory distress.  Skin:    General: Skin is warm and dry.  Neurological:     Mental Status: He is alert and oriented to person, place, and time.     Vital Signs: BP (!) 143/60 (BP Location: Right Arm)   Pulse (!) 47   Temp 97.6 F (36.4 C) (Oral)   Resp 12   Ht _0  (1.753 m)   Wt 57.2 kg   SpO2 98%   BMI 18.62 kg/m  Pain Scale: 0-10   Pain Score: 0-No pain   SpO2: SpO2: 98 % O2 Device:SpO2: 98 % O2 Flow Rate: .O2 Flow Rate (L/min): 2 L/min  IO: Intake/output summary:  Intake/Output Summary (Last 24 hours) at 11/17/2022 1011 Last data filed at 11/17/2022 0800 Gross per 24 hour  Intake 374.37 ml  Output 3275 ml  Net -2900.63 ml    LBM: Last BM Date : 11/15/22 Baseline Weight: Weight: 63.5 kg Most recent weight: Weight: 57.2 kg     Palliative Assessment/Data: PPS 30%     Time In: 1005 Time Out: 1135 Time Total: 90 minutes  Greater than 50%  of this time was spent counseling and coordinating care related to the above assessment and plan.  Signed by: Lin Landsman, NP    Please contact Palliative Medicine Team phone at [phone] for questions and concerns.  For individual provider: See Amion  *Portions of this note are a verbal dictation therefore any spelling and/or grammatical errors are due to the "[place] [employer]" system interpretation.

## 2022-11-17 NOTE — Progress Notes (Signed)
Was told during report baseline HR for pt is in 40s-50s. MD already aware.  11/17/22 2026  Vitals  Temp 98.2 F (36.8 C)  Temp Source Oral  BP 116/89  MAP (mmHg) 93  BP Location Right Arm  BP Method Automatic  Patient Position (if appropriate) Lying  Pulse Rate (!) 44  Pulse Rate Source Monitor  ECG Heart Rate (!) 44  Resp (!) 8  MEWS COLOR  MEWS Score Color Yellow  Oxygen Therapy  SpO2 90 %  O2 Device Room Air  MEWS Score  MEWS Temp 0  MEWS Systolic 0  MEWS Pulse 1  MEWS RR 1  MEWS LOC 0  MEWS Score 2

## 2022-11-17 NOTE — TOC Progression Note (Addendum)
Transition of Care [facility]) - Progression Note    Patient Details  Name: David Cervantes MRN: [id number] Date of Birth: 26-Mar-1927  Transition of Care [facility]) CM/SW [place], [place] Phone Number: 11/17/2022, 1:32 PM  Clinical Narrative:     CSW is notified by PMT that pts daughter want pt to go to SNF under LTC with Hospice services. CSW met with pt's daughter bedside to discuss. Pt has been at [facility] for short term rehab. Daughter explains that pt has [facility] and does not have any additional LTC insurance plans. CSW explains medicare is limited to STR and would not cover LTC at a SNF and that LTC would have to be private pay or pt would need medicaid.  Daughter expresses understanding and explains family would be paying privately.   CSW spoke with admissions at [facility] who said daughter will need to call sara in their business office at [phone].   CSW notified daughter and she is familiar with Clarise Cruz. She states she will call her.   1600: CSW called pt wife who states she has not spoken with [facility] regarding private pay yet. She has questions about services and insurance coverage if pt were to go back to SNF under STR with OP palliative. CSW explained that medicare may not cover STR very long or at all and that he would have to be receiving PT at SNF in order for medicare to approve STR. CSW explained that medicare would not cover STR and Hospice at the same time. She says she's still deciding between STR and LTC w/ hospice and that she will be speaking cone PMT tomorrow and would like to speak with them prior to making a final decision.   Expected Discharge Plan: Skilled Nursing Facility Barriers to Discharge: Other (must enter comment) (pts daughter to make financial arrangement with [facility] SNF for LTC with Hospice Care)  Expected Discharge Plan and Services Expected Discharge Plan: [place] arrangements for the past 2  months: [place] Determinants of Health (SDOH) Interventions    Readmission Risk Interventions    09/20/2022    9:51 AM 09/15/2022   10:45 AM  Readmission Risk Prevention Plan  Transportation Screening Complete Complete  PCP or Specialist Appt within 5-7 Days  Complete  Home Care Screening  Complete  Medication Review (RN CM)  Complete  Medication Review (RN Care Manager) Complete   PCP or Specialist appointment within 3-5 days of discharge Complete   HRI or Home Care Consult Complete   SW Recovery Care/Counseling Consult Complete   Palliative Care Screening Not [place] Complete

## 2022-11-17 NOTE — Progress Notes (Signed)
Inpatient Diabetes Program Recommendations  AACE/ADA: New Consensus Statement on Inpatient Glycemic Control (2015)  Target Ranges:  Prepandial:   less than 140 mg/dL      Peak postprandial:   less than 180 mg/dL (1-2 hours)      Critically ill patients:  140 - 180 mg/dL   Lab Results  Component Value Date   GLUCAP 112 (H) 11/17/2022   HGBA1C 9.8 (H) 09/12/2022    Review of Glycemic Control  Latest Reference Range & Units 11/16/22 06:12 11/16/22 11:25 11/16/22 16:31 11/16/22 21:27 11/17/22 06:02 11/17/22 06:38 11/17/22 11:25  Glucose-Capillary 70 - 99 mg/dL 454 (H) 098 (H) 119 (H) 103 (H) 39 (LL) 94 112 (H)   History: DM   Home DM Meds: Lantus 8 units AM/ 5 units PM                              Metformin 500 mg daily   Current Orders: Novolog Sensitive Correction Scale/ SSI (0-9 units) TID AC, Semglee 6 units daily, Novolog 2 units tid with meals (meal coverage) Inpatient Diabetes Program Recommendations:    Note low blood sugar this AM.  Patient received 10 units of Semglee on 12/5.  Semglee reduced to 6 units daily today.  Consider reducing Novolog correction to 0-6 units tid with meals.    Thanks,  Beryl Meager, RN, BC-ADM Inpatient Diabetes Coordinator Pager [phone]  (8a-5p)

## 2022-11-18 DIAGNOSIS — I5033 Acute on chronic diastolic (congestive) heart failure: Secondary | ICD-10-CM | POA: Diagnosis not present

## 2022-11-18 DIAGNOSIS — Z711 Person with feared health complaint in whom no diagnosis is made: Secondary | ICD-10-CM

## 2022-11-18 DIAGNOSIS — E876 Hypokalemia: Secondary | ICD-10-CM | POA: Diagnosis not present

## 2022-11-18 DIAGNOSIS — R4182 Altered mental status, unspecified: Secondary | ICD-10-CM | POA: Diagnosis not present

## 2022-11-18 DIAGNOSIS — E162 Hypoglycemia, unspecified: Secondary | ICD-10-CM | POA: Diagnosis not present

## 2022-11-18 LAB — BASIC METABOLIC PANEL
Anion gap: 8 (ref 5–15)
BUN: 26 mg/dL — ABNORMAL HIGH (ref 8–23)
CO2: 37 mmol/L — ABNORMAL HIGH (ref 22–32)
Calcium: 7.7 mg/dL — ABNORMAL LOW (ref 8.9–10.3)
Chloride: 93 mmol/L — ABNORMAL LOW (ref 98–111)
Creatinine, Ser: 1.12 mg/dL (ref 0.61–1.24)
GFR, Estimated: >60 mL/min (ref >60)
Glucose, Bld: 97 mg/dL (ref 70–99)
Potassium: 4.1 mmol/L (ref 3.5–5.1)
Sodium: 138 mmol/L (ref 135–145)

## 2022-11-18 LAB — GLUCOSE, CAPILLARY
Glucose-Capillary: 119 mg/dL — ABNORMAL HIGH (ref 70–99)
Glucose-Capillary: 229 mg/dL — ABNORMAL HIGH (ref 70–99)
Glucose-Capillary: 161 mg/dL — ABNORMAL HIGH (ref 70–99)
Glucose-Capillary: 92 mg/dL (ref 70–99)

## 2022-11-18 MED ORDER — ALBUMIN HUMAN 25 % IV SOLN
25.0000 g | Freq: Once | INTRAVENOUS | Status: AC
  Administered 2022-11-18: 25 g via INTRAVENOUS
  Filled 2022-11-18: qty 100

## 2022-11-18 NOTE — Progress Notes (Addendum)
Brief Palliative Medicine Progress Note:  4:55 PM Received notification patient's daughter called PMT phone.  5:25 PM Called David Cervantes - she confirms receiving calls from PMT, TOC, and [place] today; however, was unable to answer due to her own appointments. She tells me she has been considering the two options previously dicussed (rehab with outpatient Palliative Care vs LTC with hospice); however, her "only hold up is insurance" uncertainty. Reviewed Medicare would only cover either rehab or hospice, but not both. She understands LTC would be an OOP cost. Reviewed that for insurance to cover rehab, patient would need to participate in therapies and per assessment today, seems he is likely not willing - she agrees. Counseled that even with hospice at LTC, if patient is interested in ROM exercises, this could be encouraged. However, if patient does wish to sleep instead of exercise, this would be supported, too, as promoting comfort and quality of life. After discussion, David Cervantes is still most interested in hospice at LTC. She expresses appreciation for call and discussion.   PMT will continue to follow and support holistically.  Vaida Kerchner M. Katrinka Blazing, FNP-BC Palliative Medicine Team Team Phone: [phone] 15 minutes Greater than 50%  of this time was spent counseling and coordinating care related to the above assessment and plan.

## 2022-11-18 NOTE — Progress Notes (Signed)
Daily Progress Note   Patient Name: David Cervantes       Date: 11/18/2022 DOB: 1927-11-05  Age: 88 y.o. MRN#: [id number] Attending Physician: Domenic Polite, MD Primary Care Physician: [name], MD Admit Date: 11/14/2022  Reason for Consultation/Follow-up: Establishing goals of care  Subjective: Discussed case with [facility] and hospice liaison. Per later [facility] conversations yesterday, [name] requested to speak with PMT prior to making final decisions on LTC with hospice vs rehab with outpatient Palliative Care.   Chart review performed - received report from primary RN - no acute concerns. RN states patient is eating/drinking; however, not moving well, agitated with attempts at interaction stating he "just wants to sleep." Patient was refusing medications this morning. RN tells me that outside of interactions, patient is sleeping.  Went to visit patient at bedside - no family/visitors present. Patient was lying in bed asleep - I did not attempt to wake him. No signs or non-verbal gestures of pain or discomfort noted. No respiratory distress, increased work of breathing, or secretions noted.   12:15 PM Attempted to call daughter/Lois for continued discussions on wishes, disposition, and options - no answer - confidential voicemail left and PMT phone number provided with request to return call.  Length of Stay: 4  Current Medications: Scheduled Meds:   Chlorhexidine Gluconate Cloth  6 each Topical Daily   docusate sodium  100 mg Oral BID   enoxaparin (LOVENOX) injection  40 mg Subcutaneous Q24H   furosemide  60 mg Intravenous Q12H   insulin aspart  0-6 Units Subcutaneous TID WC   insulin aspart  2 Units Subcutaneous TID WC   insulin glargine-yfgn  6 Units Subcutaneous Daily   sodium  chloride flush  3 mL Intravenous Q12H    Continuous Infusions:  sodium chloride      PRN Meds: sodium chloride, acetaminophen, acetaminophen, ondansetron (ZOFRAN) IV, mouth rinse, sodium chloride flush  Physical Exam Vitals and nursing note reviewed.  Constitutional:      General: He is not in acute distress.    Appearance: He is ill-appearing.  Pulmonary:     Effort: No respiratory distress.  Skin:    General: Skin is warm and dry.  Neurological:     Mental Status: He is lethargic.     Motor: Weakness present.  Vital Signs: BP (!) 113/51 (BP Location: Left Arm)   Pulse 60   Temp 98.2 F (36.8 C) (Oral)   Resp 11   Ht 5\' 9"  (1.753 m)   Wt 57 kg   SpO2 99%   BMI 18.56 kg/m  SpO2: SpO2: 99 % O2 Device: O2 Device: Nasal Cannula O2 Flow Rate: O2 Flow Rate (L/min): 3 L/min  Intake/output summary:  Intake/Output Summary (Last 24 hours) at 11/18/2022 1109 Last data filed at 11/18/2022 1000 Gross per 24 hour  Intake 458.11 ml  Output 4550 ml  Net -4091.89 ml   LBM: Last BM Date : 11/15/22 Baseline Weight: Weight: 63.5 kg Most recent weight: Weight: 57 kg       Palliative Assessment/Data: PPS 30%      Patient Active Problem List   Diagnosis Date Noted   Acute on chronic diastolic CHF (congestive heart failure) ([place]) 11/14/2022   Protein-calorie malnutrition, severe 09/13/2022   Closed right hip fracture (HCC) 09/12/2022   Chronic diastolic CHF (congestive heart failure) ([place]) 09/12/2022   Complete heart block ([place]) 09/12/2022   AKI (acute kidney injury) ([place]) 09/12/2022   Normocytic anemia 09/12/2022   Dementia ([place]) 04/20/2022   Mixed hyperlipidemia 04/20/2022   Acute respiratory failure with hypoxia ([place]) 04/20/2022   DM (diabetes mellitus) ([place]) 04/20/2022   HTN (hypertension) 04/20/2022   Acute CHF ([place]) 04/20/2022    Palliative Care Assessment & Plan   Patient Profile: 88 y.o. male  with past medical history of  chronic HFpEF,  high-grade AV block, IDDM, HTN, pulmonary hypertension, and advanced dementia presented to ED on 11/14/22 from [place] with respiratory distress and AMS. Patient was admitted on 11/14/2022 with acute metabolic encephalopathy, severe hypokalemia, acute on chronic combined HF decompensation, hypocalcemia, chronic high-grade AV block.    ED Course: Patient was found in bradycardia, monitor showed third-degree AV block, blood pressure borderline low hypoxic 88% on room air.  Glucose 40s was given 2 rounds of D50 and remained borderline low.  Chest x-ray showed pulmonary congestion and left-sided pleural effusion.  K2.0.  Creatinine 1.0.   Assessment: Principal Problem:   Acute on chronic diastolic CHF (congestive heart failure) (HCC) Active Problems:   Dementia (HCC)   Mixed hyperlipidemia   DM (diabetes mellitus) (HCC)   HTN (hypertension)   Complete heart block (HCC)   AKI (acute kidney injury) ([place])   Protein-calorie malnutrition, severe   Concern about end of life  Recommendations/Plan: Unable to speak with patient's daughter today - VM left to return call. Per previous discussions, she was interested in patient's discharge to [place] with hospice; however, per [facility] she wanted to continue discussions with PTM today. Will continue to attempt contact  Continue to treat the treatable Continue DNR/DNI as previously documented PMT will continue to follow and support holistically   Goals of Care and Additional Recommendations: Limitations on Scope of Treatment: Full Scope Treatment, No Artificial Feeding, and No Tracheostomy  Code Status:    Code Status Orders  (From admission, onward)           Start     Ordered   11/14/22 1347  Do not attempt resuscitation (DNR)  Continuous       Question Answer Comment  In the event of cardiac or respiratory ARREST Do not call a "code blue"   In the event of cardiac or respiratory ARREST Do not perform Intubation, CPR, defibrillation or  ACLS   In the event of cardiac or respiratory ARREST Use medication  by any route, position, wound care, and other measures to relive pain and suffering. May use oxygen, suction and manual treatment of airway obstruction as needed for comfort.      11/14/22 1347           Code Status History     Date Active Date Inactive Code Status Order ID Comments User Context   09/15/2022 0927 09/20/2022 1843 DNR [id number]  Dorcas Carrow, MD Inpatient   09/12/2022 1328 09/15/2022 0927 Full Code [id number]  Teddy Spike, DO Inpatient   04/20/2022 2053 04/25/2022 1947 Partial Code [id number]  Synetta Fail, MD ED   04/20/2022 1830 04/20/2022 2052 Full Code [id number]  Synetta Fail, MD ED      Advance Directive Documentation    Flowsheet Row Most Recent Value  Type of Advance Directive Living will, Healthcare Power of Attorney  Pre-existing out of facility DNR order (yellow form or pink MOST form) --  "MOST" Form in Place? --       Prognosis:  < 6 months would not be surprising if goal is no rehospitalization  Discharge Planning: To Be Determined  Care plan was discussed with primary RN, TOC, hospice liaison  Thank you for allowing the Palliative Medicine Team to assist in the care of this patient.  Haskel Khan, NP  Please contact Palliative Medicine Team phone at [phone] for questions and concerns.   *Portions of this note are a verbal dictation therefore any spelling and/or grammatical errors are due to the "Dragon Medical One" system interpretation.

## 2022-11-18 NOTE — Care Management Important Message (Signed)
Important Message  Patient Details  Name: David Cervantes MRN: [id number] Date of Birth: 1927/10/07   Medicare Important Message Given:  Yes     Renie Ora 11/18/2022, 12:47 PM

## 2022-11-18 NOTE — Progress Notes (Signed)
PROGRESS NOTE    Ellian Durkee  [id number] DOB: 12/04/1927 DOA: 11/14/2022 PCP: [name], MD   89/M w/ history of chronic diastolic CHF, pulmonary hypertension, high-grade AV block, type 2 diabetes mellitus, dementia, resident of SNF was found unresponsive at his facility with a glucose of 30, heart rate in the 40s, history of recent increase in diuretic dose for worsening edema and dyspnea, in the ED was not noted to be tachypneic with edema, -In the emergency room labs with potassium of 2.0, creatinine 0.9, BNP 472, CT head unremarkable, chest x-ray with bilateral pulmonary vascular congestion and small pleural effusions, EKG with complete heart block, junctional escape rhythm with PVCs  Subjective: -Feels better overall, breathing fair, eating well  Assessment and Plan:   Acute on chronic diastolic CHF  Pulmonary hypertension Severe Hypoalbuminemia with third spacing -Echo with EF 50-55%, RV function is preserved, severe pulmonary hypertension  -Treated conservatively with diuretics only, poor overall prognosis with longstanding advanced heart block, dementia, poor functional status, albumin of 1.5 -Continue IV Lasix 1 more day, will repeat albumin today, diuresed well yesterday, 5.8 L negative -Appreciate palliative care eval yesterday, back to SNF with hospice is most appropriate  Recent Hip fracture -surgery in October, has been bedbound in an SNF since then -PT/OT  AKI (acute kidney injury) ([place]) Hypokalemia and hypomagnesemia. -Electrolytes repleted, monitor  DM (diabetes mellitus) ([place]) Hypoglycemia. Hyperglycemia.  -Started on long-acting insulin, continue sliding scale insulin -decreased dose yesterday, continue meal coverage  Complete heart block ([place]) Patient is hemodynamically stable, continue close monitoring. Chronic advanced AV block, he is DNR - not a good candidate for pacemaker, considering his poor functional status and dementia.   -palliative consult appreciated, hospice being considered  HTN (hypertension) -stable  Mixed hyperlipidemia No statin   Dementia ([place]) -Now stable  Protein-calorie malnutrition, severe Continue with nutritional supplementation.   Urinary retention -foley placed this admission 12/3  DVT prophylaxis:lovenox Code Status: DNR Family Communication: daughter at bedside Disposition Plan: SNF, possibly with hospice  Consultants: Palliative care   Procedures:   Antimicrobials:    Objective: Vitals:   11/17/22 2026 11/17/22 2100 11/18/22 0410 11/18/22 1000  BP: 116/89  (!) 114/50 (!) 113/51  Pulse: (!) 44   60  Resp: (!) 8 18  11   Temp: 98.2 F (36.8 C)  98.7 F (37.1 C) 98.2 F (36.8 C)  TempSrc: Oral  Oral Oral  SpO2: 90%   99%  Weight:   57 kg   Height:        Intake/Output Summary (Last 24 hours) at 11/18/2022 1110 Last data filed at 11/18/2022 1000 Gross per 24 hour  Intake 458.11 ml  Output 4550 ml  Net -4091.89 ml   Filed Weights   11/16/22 0400 11/17/22 0546 11/18/22 0410  Weight: 59 kg 57.2 kg 57 kg    Examination:  Elderly frail chronically ill male sitting up in bed, awake alert, hard of hearing, oriented to self and partly to place, cognitive deficits CVS: S1-S2, regular rhythm Lungs: Decreased breath sounds at the bases Abdomen: Soft, nontender, bowel sounds present Extremities: 1-2+ edema, in both lower legs, near right elbow etc.    Data Reviewed:   CBC: Recent Labs  Lab 11/14/22 1113 11/14/22 1118 11/14/22 1126 11/15/22 0107  WBC 14.7*  --   --  11.5*  NEUTROABS 12.8*  --   --   --   HGB 9.1* 9.2* 8.8* 7.9*  HCT 28.1* 27.0* 26.0* 23.2*  MCV 94.0  --   --  89.2  PLT 386  --   --  329   Basic Metabolic Panel: Recent Labs  Lab 11/14/22 1113 11/14/22 1118 11/14/22 1126 11/15/22 0107 11/16/22 0134 11/17/22 0104 11/18/22 0122  NA 136   < > 138 138 136 140 138  K 2.0*   < > 2.3* 2.8* 4.3 3.9 4.1  CL 98  --  95* 100 101 100  93*  CO2 29  --   --  28 27 33* 37*  GLUCOSE 229*  --  198* 240* 303* 46* 97  BUN 15  --  19 15 20 20  26*  CREATININE 0.90  --  0.80 0.83 1.31* 1.06 1.12  CALCIUM 6.8*  --   --  7.1* 7.0* 7.8* 7.7*  MG  --   --   --   --  1.5*  --   --    < > = values in this interval not displayed.   GFR: Estimated Creatinine Clearance: 33.2 mL/min (by C-G formula based on SCr of 1.12 mg/dL). Liver Function Tests: Recent Labs  Lab 11/14/22 1113  AST 15  ALT 16  ALKPHOS 126  BILITOT 0.2*  PROT 5.3*  ALBUMIN 1.6*   Recent Labs  Lab 11/14/22 1113  LIPASE 22   Recent Labs  Lab 11/14/22 1114  AMMONIA 39*   Coagulation Profile: No results for input(s): "INR", "PROTIME" in the last 168 hours. Cardiac Enzymes: No results for input(s): "CKTOTAL", "CKMB", "CKMBINDEX", "TROPONINI" in the last 168 hours. BNP (last 3 results) No results for input(s): "PROBNP" in the last 8760 hours. HbA1C: No results for input(s): "HGBA1C" in the last 72 hours. CBG: Recent Labs  Lab 11/17/22 0638 11/17/22 1125 11/17/22 1611 11/17/22 2133 11/18/22 0619  GLUCAP 94 112* 90 139* 119*   Lipid Profile: No results for input(s): "CHOL", "HDL", "LDLCALC", "TRIG", "CHOLHDL", "LDLDIRECT" in the last 72 hours. Thyroid Function Tests: No results for input(s): "TSH", "T4TOTAL", "FREET4", "T3FREE", "THYROIDAB" in the last 72 hours.  Anemia Panel: No results for input(s): "VITAMINB12", "FOLATE", "FERRITIN", "TIBC", "IRON", "RETICCTPCT" in the last 72 hours. Urine analysis:    Component Value Date/Time   COLORURINE YELLOW 11/14/2022 1034   APPEARANCEUR HAZY (A) 11/14/2022 1034   LABSPEC 1.013 11/14/2022 1034   PHURINE 5.0 11/14/2022 1034   GLUCOSEU 50 (A) 11/14/2022 1034   HGBUR NEGATIVE 11/14/2022 1034   BILIRUBINUR NEGATIVE 11/14/2022 1034   KETONESUR NEGATIVE 11/14/2022 1034   PROTEINUR 100 (A) 11/14/2022 1034   NITRITE NEGATIVE 11/14/2022 1034   LEUKOCYTESUR NEGATIVE 11/14/2022 1034   Sepsis  Labs: @LABRCNTIP (procalcitonin:4,lacticidven:4)  ) Recent Results (from the past 240 hour(s))  Urine Culture     Status: None   Collection Time: 11/14/22 11:13 AM   Specimen: Urine, Clean Catch  Result Value Ref Range Status   Specimen Description URINE, CLEAN CATCH  Final   Special Requests NONE  Final   Culture   Final    NO GROWTH Performed at [facility], [address], [place], [address] [place]    Report Status 11/16/2022 FINAL  Final     Radiology Studies: No results found.   Scheduled Meds:  Chlorhexidine Gluconate Cloth  6 each Topical Daily   docusate sodium  100 mg Oral BID   enoxaparin (LOVENOX) injection  40 mg Subcutaneous Q24H   furosemide  60 mg Intravenous Q12H   insulin aspart  0-6 Units Subcutaneous TID WC   insulin aspart  2 Units Subcutaneous TID WC   insulin glargine-yfgn  6 Units Subcutaneous Daily   sodium chloride flush  3 mL Intravenous Q12H   Continuous Infusions:  sodium chloride     albumin human       LOS: 4 days    Time spent:72min    Domenic Polite, MD [facility]   11/18/2022, 11:10 AM

## 2022-11-18 NOTE — TOC Progression Note (Signed)
Transition of Care [facility]) - Progression Note    Patient Details  Name: David Cervantes MRN: [id number] Date of Birth: 08-29-1927  [facility] of [facility]) CM/SW Contact  Erin Sons, [place] Phone Number: 11/18/2022, 2:48 PM  Clinical Narrative:     TOC following for pt's daughter decision between SNF with houspice(private pay) or SNF for STR(requires medicare auth). Daughter was supposed to speak with PMT today and then make decision though PMT unable to reach her so far.   CSW provided [facility] with update. Liaison also called pts daughter and left voicemail; awaiting response.   CSW called pt's daughter and left voicemail requesting return call.    Expected Discharge Plan: Skilled Nursing Facility Barriers to Discharge: Other (must enter comment) (pts daughter to make financial arrangement with [facility] SNF for LTC with Hospice Care)  Expected Discharge Plan and Services Expected Discharge Plan: Skilled Nursing Facility       Living arrangements for the past 2 months: Skilled Nursing Facility                                       Social Determinants of Health (SDOH) Interventions    Readmission Risk Interventions    09/20/2022    9:51 AM 09/15/2022   10:45 AM  Readmission Risk Prevention Plan  Transportation Screening Complete Complete  PCP or Specialist Appt within 5-7 Days  Complete  Home Care Screening  Complete  Medication Review (RN CM)  Complete  Medication Review (RN Care Manager) Complete   PCP or Specialist appointment within 3-5 days of discharge Complete   HRI or Home Care Consult Complete   SW Recovery Care/Counseling Consult Complete   Palliative Care Screening Not Applicable   Skilled Nursing Facility Complete

## 2022-11-19 DIAGNOSIS — E162 Hypoglycemia, unspecified: Secondary | ICD-10-CM | POA: Diagnosis not present

## 2022-11-19 DIAGNOSIS — R4182 Altered mental status, unspecified: Secondary | ICD-10-CM | POA: Diagnosis not present

## 2022-11-19 DIAGNOSIS — R638 Other symptoms and signs concerning food and fluid intake: Secondary | ICD-10-CM

## 2022-11-19 DIAGNOSIS — E876 Hypokalemia: Secondary | ICD-10-CM | POA: Diagnosis not present

## 2022-11-19 DIAGNOSIS — I5033 Acute on chronic diastolic (congestive) heart failure: Secondary | ICD-10-CM | POA: Diagnosis not present

## 2022-11-19 DIAGNOSIS — Z711 Person with feared health complaint in whom no diagnosis is made: Secondary | ICD-10-CM

## 2022-11-19 LAB — BASIC METABOLIC PANEL
Anion gap: 11 (ref 5–15)
BUN: 31 mg/dL — ABNORMAL HIGH (ref 8–23)
CO2: 38 mmol/L — ABNORMAL HIGH (ref 22–32)
Calcium: 7.8 mg/dL — ABNORMAL LOW (ref 8.9–10.3)
Chloride: 89 mmol/L — ABNORMAL LOW (ref 98–111)
Creatinine, Ser: 1.2 mg/dL (ref 0.61–1.24)
GFR, Estimated: 56 mL/min — ABNORMAL LOW (ref >60)
Glucose, Bld: 135 mg/dL — ABNORMAL HIGH (ref 70–99)
Potassium: 3.7 mmol/L (ref 3.5–5.1)
Sodium: 138 mmol/L (ref 135–145)

## 2022-11-19 LAB — GLUCOSE, CAPILLARY
Glucose-Capillary: 88 mg/dL (ref 70–99)
Glucose-Capillary: 84 mg/dL (ref 70–99)
Glucose-Capillary: 217 mg/dL — ABNORMAL HIGH (ref 70–99)

## 2022-11-19 MED ORDER — TORSEMIDE 20 MG PO TABS
20.0000 mg | ORAL_TABLET | Freq: Every day | ORAL | Status: DC
  Administered 2022-11-19: 20 mg via ORAL
  Filled 2022-11-19: qty 1

## 2022-11-19 MED ORDER — ENOXAPARIN SODIUM 30 MG/0.3ML IJ SOSY: 30.0000 mg | PREFILLED_SYRINGE | INTRAMUSCULAR | Status: DC

## 2022-11-19 MED ORDER — TORSEMIDE 20 MG PO TABS: 20.0000 mg | ORAL_TABLET | Freq: Every day | ORAL | Status: AC

## 2022-11-19 NOTE — Progress Notes (Signed)
Daily Progress Note   Patient Name: David Cervantes       Date: 11/19/2022 DOB: July 08, 1927  Age: 88 y.o. MRN#: [id number] Attending Physician: Zannie Cove, MD Primary Care Physician: Georgann Housekeeper, MD Admit Date: 11/14/2022  Reason for Consultation/Follow-up: {Reason for Consult:23484}  Subjective: ***  All questions and concerns addressed. Encouraged to call with questions and/or concerns. PMT card provided.  Length of Stay: 5  Current Medications: Scheduled Meds:   Chlorhexidine Gluconate Cloth  6 each Topical Daily   docusate sodium  100 mg Oral BID   enoxaparin (LOVENOX) injection  40 mg Subcutaneous Q24H   insulin aspart  0-6 Units Subcutaneous TID WC   insulin aspart  2 Units Subcutaneous TID WC   insulin glargine-yfgn  6 Units Subcutaneous Daily   sodium chloride flush  3 mL Intravenous Q12H   torsemide  20 mg Oral Daily    Continuous Infusions:  sodium chloride      PRN Meds: sodium chloride, acetaminophen, acetaminophen, ondansetron (ZOFRAN) IV, mouth rinse, sodium chloride flush  Physical Exam          Vital Signs: BP (!) 135/50 (BP Location: Left Arm)   Pulse (!) 44   Temp 98 F (36.7 C) (Oral)   Resp 12   Ht 5\' 9"  (1.753 m)   Wt 52.1 kg   SpO2 93%   BMI 16.96 kg/m  SpO2: SpO2: 93 % O2 Device: O2 Device: Nasal Cannula O2 Flow Rate: O2 Flow Rate (L/min): 2 L/min  Intake/output summary:  Intake/Output Summary (Last 24 hours) at 11/19/2022 0931 Last data filed at 11/19/2022 0830 Gross per 24 hour  Intake 553.46 ml  Output 5000 ml  Net -4446.54 ml   LBM: Last BM Date : 11/18/22 Baseline Weight: Weight: 63.5 kg Most recent weight: Weight: 52.1 kg       Palliative Assessment/Data:      Patient Active Problem List   Diagnosis Date Noted    Acute on chronic diastolic CHF (congestive heart failure) (HCC) 11/14/2022   Protein-calorie malnutrition, severe 09/13/2022   Closed right hip fracture (HCC) 09/12/2022   Chronic diastolic CHF (congestive heart failure) (HCC) 09/12/2022   Complete heart block (HCC) 09/12/2022   AKI (acute kidney injury) (HCC) 09/12/2022   Normocytic anemia 09/12/2022   Dementia (HCC) 04/20/2022   Mixed hyperlipidemia 04/20/2022  Acute respiratory failure with hypoxia ([place]) 04/20/2022   DM (diabetes mellitus) ([place]) 04/20/2022   HTN (hypertension) 04/20/2022   Acute CHF ([place]) 04/20/2022    Palliative Care Assessment & Plan   Patient Profile: ***  Assessment: Principal Problem:   Acute on chronic diastolic CHF (congestive heart failure) (HCC) Active Problems:   Dementia (HCC)   Mixed hyperlipidemia   DM (diabetes mellitus) ([place])   HTN (hypertension)   Complete heart block (HCC)   AKI (acute kidney injury) ([place])   Protein-calorie malnutrition, severe   Recommendations/Plan: ***  Goals of Care and Additional Recommendations: Limitations on Scope of Treatment: {Recommended Scope and Preferences:21019}  Code Status:    Code Status Orders  (From admission, onward)           Start     Ordered   11/14/22 1347  Do not attempt resuscitation (DNR)  Continuous       Question Answer Comment  In the event of cardiac or respiratory ARREST Do not call a "code blue"   In the event of cardiac or respiratory ARREST Do not perform Intubation, CPR, defibrillation or ACLS   In the event of cardiac or respiratory ARREST Use medication by any route, position, wound care, and other measures to relive pain and suffering. May use oxygen, suction and manual treatment of airway obstruction as needed for comfort.      11/14/22 1347           Code Status History     Date Active Date Inactive Code Status Order ID Comments User Context   09/15/2022 0927 09/20/2022 1843 DNR [id number]  Barb Merino,  MD Inpatient   09/12/2022 1328 09/15/2022 0927 Full Code [id number]  Jonnie Finner, DO Inpatient   04/20/2022 2053 04/25/2022 1947 Partial Code [id number]  Marcelyn Bruins, MD ED   04/20/2022 1830 04/20/2022 2052 Full Code [id number]  Marcelyn Bruins, MD ED      Advance Directive Documentation    Flowsheet Row Most Recent Value  Type of Advance Directive Living will, Healthcare Power of Attorney  Pre-existing out of facility DNR order (yellow form or pink MOST form) --  "MOST" Form in Place? --       Prognosis:  {Palliative Care Prognosis:23504}  Discharge Planning: {Palliative dispostion:23505}  Care plan was discussed with ***  Thank you for allowing the Palliative Medicine Team to assist in the care of this patient.   Time In: *** Time Out: *** Total Time *** Prolonged Time Billed  {YES NO:22349}       Greater than 50%  of this time was spent counseling and coordinating care related to the above assessment and plan.  Lin Landsman, NP  Please contact Palliative Medicine Team phone at [phone] for questions and concerns.   *Portions of this note are a verbal dictation therefore any spelling and/or grammatical errors are due to the "[place] [employer]" system interpretation.

## 2022-11-19 NOTE — Progress Notes (Signed)
Pt being d/c , VSS, IV removed, PTAR here for transport. Report was attempted no one answered to take the call. Called twice.   Balinda Quails, RN 11/19/2022 3:28 PM

## 2022-11-19 NOTE — TOC Transition Note (Signed)
Transition of Care [facility]) - CM/SW Discharge Note   Patient Details  Name: David Cervantes MRN: [id number] Date of Birth: Sep 04, 1927  [facility]) CM/SW Contact:  Ralene Bathe, LCSWA Phone Number: 11/19/2022, 1:06 PM   Clinical Narrative:    Patient will DC to: [facility] Anticipated DC date: 11/19/2022 Family notified: yes Transport by: Sharin Mons   Per MD patient ready for DC to SNF. RN to call report prior to discharge ([phone] room 118A). RN,  patient's family, and facility notified of DC. Discharge Summary sent to facility. DC packet on chart. Ambulance transport requested for patient.   CSW will sign off for now as social work intervention is no longer needed. Please consult [place] again if new needs arise.    Final next level of care: Skilled Nursing Facility Barriers to Discharge: Barriers Resolved   Patient Goals and CMS Choice        Discharge Placement              Patient chooses bed at:  [facility]) Patient to be transferred to facility by: PTAR Name of family member notified: Smartt,Lois (Daughter) [phone] Patient and family notified of of transfer: 11/19/22  Discharge Plan and Services                                     Social Determinants of Health (SDOH) Interventions     Readmission Risk Interventions    09/20/2022    9:51 AM 09/15/2022   10:45 AM  Readmission Risk Prevention Plan  Transportation Screening Complete Complete  PCP or Specialist Appt within 5-7 Days  Complete  Home Care Screening  Complete  Medication Review (RN CM)  Complete  Medication Review (RN Care Manager) Complete   PCP or Specialist appointment within 3-5 days of discharge Complete   HRI or Home Care Consult Complete   SW Recovery Care/Counseling Consult Complete   Palliative Care Screening Not Applicable   Skilled Nursing Facility Complete

## 2022-11-19 NOTE — Discharge Summary (Signed)
Physician Discharge Summary  David Cervantes [id number] DOB: [birth year] DOA: 11/14/2022  PCP: [name], MD  Admit date: 11/14/2022 Discharge date: 11/19/2022  Time spent: 35 minutes  Recommendations for Outpatient Follow-up:  SNF with hospice for symptom and comfort focused care   Discharge Diagnoses:  Principal Problem:   Acute on chronic diastolic CHF (congestive heart failure) ([place]) Active Problems:   AKI (acute kidney injury) ([place])   DM (diabetes mellitus) ([place])   Complete heart block ([place])   HTN (hypertension)   Mixed hyperlipidemia   Dementia ([place])   Protein-calorie malnutrition, severe   Discharge Condition: Stable, guarded prognosis  Diet recommendation: Low-sodium, diabetic  Filed Weights   11/17/22 0546 11/18/22 0410 11/19/22 0505  Weight: 57.2 kg 57 kg 52.1 kg    History of present illness:  David Cervantes w/ history of chronic diastolic CHF, pulmonary hypertension, high-grade AV block, type 2 diabetes mellitus, dementia, resident of SNF was found unresponsive at his facility with a glucose of 30, heart rate in the 40s, history of recent increase in diuretic dose for worsening edema and dyspnea, in the ED was not noted to be tachypneic with edema, -In the emergency room labs with potassium of 2.0, creatinine 0.9, BNP 472, CT head unremarkable, chest x-ray with bilateral pulmonary vascular congestion and small pleural effusions, EKG with complete heart block, junctional escape rhythm with PVCs  Hospital Course:   Acute on chronic diastolic CHF  Pulmonary hypertension Severe Hypoalbuminemia with third spacing -Echo with EF 50-55%, RV function is preserved, severe pulmonary hypertension  -Treated conservatively with diuretics only, poor overall prognosis with longstanding advanced heart block, dementia, poor functional status, albumin of 1.5 -Diuresed with IV Lasix, supplemental albumin given to augment diuresis, volume status is improved, overall prognosis is  poor, transitioned to oral torsemide today -Appreciate palliative care eval, he will be discharged back to SNF with hospice   Recent Hip fracture -surgery in October, has been bedbound in an SNF since then   AKI (acute kidney injury) ([place]) Hypokalemia and hypomagnesemia. -Electrolytes repleted   DM (diabetes mellitus) ([place]) Hypoglycemia. Hyperglycemia.  -Started on long-acting insulin, continue sliding scale insulin -decreased dose yesterday, continue meal coverage   Complete heart block ([place]) Patient is hemodynamically stable, continue close monitoring. Chronic advanced AV block, he is DNR - not a good candidate for pacemaker, considering his poor functional status and dementia.  -palliative consult appreciated, discharged back to SNF with hospice   HTN (hypertension) -stable   Mixed hyperlipidemia No statin    Dementia ([place]) -Now stable   Protein-calorie malnutrition, severe Continue with nutritional supplementation.    Urinary retention -foley placed this admission 12/3    Consultations: Palliative care  Discharge Exam: Vitals:   11/19/22 0505 11/19/22 0807  BP: (!) 149/48 (!) 135/50  Pulse: (!) 36 (!) 44  Resp: 18 12  Temp: 98 F (36.7 C) 98 F (36.7 C)  SpO2: 99% 93%   Elderly frail chronically ill male sitting up in bed, awake alert, hard of hearing, oriented to self and partly to place, cognitive deficits CVS: S1-S2, regular rhythm Lungs: Decreased breath sounds at the bases Abdomen: Soft, nontender, bowel sounds present Extremities: Trace + edema, in both lower legs, near right elbow etc.  Discharge Instructions   Discharge Instructions     Diet - low sodium heart healthy   Complete by: As directed       Allergies as of 11/19/2022       Reactions   Tape Other (See Comments)  SKIN TEARS AND BRUISES VERY EASILY!!        Medication List     STOP taking these medications    furosemide 40 MG tablet Commonly known as: LASIX    ramipril 2.5 MG capsule Commonly known as: ALTACE       TAKE these medications    acetaminophen 325 MG tablet Commonly known as: TYLENOL Take 1-2 tablets (325-650 mg total) by mouth every 6 (six) hours as needed for mild pain (pain score 1-3 or temp > 100.5).   ascorbic acid 500 MG tablet Commonly known as: VITAMIN C Take 500 mg by mouth daily.   aspirin 81 MG chewable tablet Chew 81 mg by mouth daily.   b complex vitamins capsule Take 1 capsule by mouth every other day.   bisacodyl 10 MG suppository Commonly known as: DULCOLAX Place 10 mg rectally as needed for moderate constipation. If not relieved by milk of magnesia, give 10 mg bisacdoyl suppository rectally x 1 dose in 24 hours as needed.   cholecalciferol 25 MCG (1000 UNIT) tablet Commonly known as: VITAMIN D3 Take 1,000 Units by mouth every Monday, Wednesday, and Friday.   cyanocobalamin 1000 MCG tablet Commonly known as: VITAMIN B12 Take 1,000 mcg by mouth every other day.   docusate sodium 100 MG capsule Commonly known as: COLACE Take 1 capsule (100 mg total) by mouth 2 (two) times daily.   guaiFENesin 100 MG/5ML liquid Commonly known as: ROBITUSSIN Take 10 mLs by mouth every 4 (four) hours as needed for cough.   Lantus SoloStar 100 UNIT/ML Solostar Pen Generic drug: insulin glargine Inject 8 Units into the skin daily before breakfast. What changed:  how much to take when to take this additional instructions   magnesium hydroxide 400 MG/5ML suspension Commonly known as: MILK OF MAGNESIA Take 30 mLs by mouth daily as needed for mild constipation. If no BM in three days, give 30 mL by mouth x 1 dose in 24 hours as needed.   magnesium oxide 400 MG tablet Commonly known as: MAG-OX Take 1 tablet (400 mg total) by mouth daily.   melatonin 3 MG Tabs tablet Take 3 mg by mouth at bedtime.   metFORMIN 500 MG 24 hr tablet Commonly known as: GLUCOPHAGE-XR Take 500 mg by mouth daily with breakfast.   NON  FORMULARY Take 1 Container by mouth daily. Give magic cup by mouth once daily.   OXYGEN Inhale 2 L/min into the lungs every evening.   potassium chloride SA 20 MEQ tablet Commonly known as: KLOR-CON M Take 20 mEq by mouth daily.   RA SALINE ENEMA RE Place 1 enema rectally as needed (constipation). If not relieved by bisacodyl suppository, give disposable saline enema rectally x 1 dose/24 hours as needed.   torsemide 20 MG tablet Commonly known as: DEMADEX Take 1 tablet (20 mg total) by mouth daily. Start taking on: November 20, 2022   Zinc 50 MG Tabs Take 50 mg by mouth 2 (two) times daily.       Allergies  Allergen Reactions   Tape Other (See Comments)    SKIN TEARS AND BRUISES VERY EASILY!!      The results of significant diagnostics from this hospitalization (including imaging, microbiology, ancillary and laboratory) are listed below for reference.    Significant Diagnostic Studies: CT HEAD WO CONTRAST (5MM)  Result Date: 11/14/2022 CLINICAL DATA:  Altered mental status EXAM: CT HEAD WITHOUT CONTRAST TECHNIQUE: Contiguous axial images were obtained from the base of the skull through the  vertex without intravenous contrast. RADIATION DOSE REDUCTION: This exam was performed according to the departmental dose-optimization program which includes automated exposure control, adjustment of the mA and/or kV according to patient size and/or use of iterative reconstruction technique. COMPARISON:  03/25/2022 FINDINGS: Brain: No acute intracranial findings are seen. There are no signs of bleeding within the cranium. Small calcifications are seen in pons, possibly healed granulomas. There is no adjacent edema or mass effect. Cortical sulci are prominent. There is decreased density in periventricular and subcortical white matter. Vascular: Unremarkable. Skull: No fracture is seen in calvarium. There is fluid density in mastoid air cells, more so on the left side. Sinuses/Orbits: There is  mucosal thickening in ethmoid and maxillary sinuses. There are no air-fluid levels. Other: None. IMPRESSION: No acute intracranial findings are seen in noncontrast CT brain. Atrophy. Small-vessel disease. Chronic ethmoid and maxillary sinusitis. Bilateral mastoid effusions are seen, more so on the left side. Electronically Signed   By: Ernie Avena M.D.   On: 11/14/2022 12:47   DG Chest Portable 1 View  Result Date: 11/14/2022 CLINICAL DATA:  Altered mental status. EXAM: PORTABLE CHEST 1 VIEW COMPARISON:  09/12/2022 FINDINGS: Patient is moderately rotated to the left. Lungs are somewhat hypoinflated demonstrate hazy opacification over the left midlung and lung base without change. Subtle hazy density over the medial right base. Findings may be due to left effusion with associated atelectasis. Infection is possible. Cardiomediastinal silhouette and remainder of the exam is unchanged. IMPRESSION: Hazy opacification over the left midlung and lung base unchanged and may be due to effusion with associated atelectasis, although infection is possible. Mild hazy density over the medial right base. Electronically Signed   By: Elberta Fortis M.D.   On: 11/14/2022 11:37    Microbiology: Recent Results (from the past 240 hour(s))  Urine Culture     Status: None   Collection Time: 11/14/22 11:13 AM   Specimen: Urine, Clean Catch  Result Value Ref Range Status   Specimen Description URINE, CLEAN CATCH  Final   Special Requests NONE  Final   Culture   Final    NO GROWTH Performed at [facility], [address], [place], [place] [address]    Report Status 11/16/2022 FINAL  Final     Labs: Basic Metabolic Panel: Recent Labs  Lab 11/15/22 0107 11/16/22 0134 11/17/22 0104 11/18/22 0122 11/19/22 0111  NA 138 136 140 138 138  K 2.8* 4.3 3.9 4.1 3.7  CL 100 101 100 93* 89*  CO2 28 27 33* 37* 38*  GLUCOSE 240* 303* 46* 97 135*  BUN 15 20 20  26* 31*  CREATININE 0.83 1.31* 1.06 1.12 1.20   CALCIUM 7.1* 7.0* 7.8* 7.7* 7.8*  MG  --  1.5*  --   --   --    Liver Function Tests: Recent Labs  Lab 11/14/22 1113  AST 15  ALT 16  ALKPHOS 126  BILITOT 0.2*  PROT 5.3*  ALBUMIN 1.6*   Recent Labs  Lab 11/14/22 1113  LIPASE 22   Recent Labs  Lab 11/14/22 1114  AMMONIA 39*   CBC: Recent Labs  Lab 11/14/22 1113 11/14/22 1118 11/14/22 1126 11/15/22 0107  WBC 14.7*  --   --  11.5*  NEUTROABS 12.8*  --   --   --   HGB 9.1* 9.2* 8.8* 7.9*  HCT 28.1* 27.0* 26.0* 23.2*  MCV 94.0  --   --  89.2  PLT 386  --   --  329   Cardiac Enzymes: No results for input(s): "CKTOTAL", "CKMB", "CKMBINDEX", "TROPONINI" in the last 168 hours. BNP: BNP (last 3 results) Recent Labs    04/20/22 1725 11/14/22 1114  BNP 511.2* 472.8*    ProBNP (last 3 results) No results for input(s): "PROBNP" in the last 8760 hours.  CBG: Recent Labs  Lab 11/18/22 1145 11/18/22 1612 11/18/22 2112 11/19/22 0629 11/19/22 0909  GLUCAP 229* 161* 92 88 84   Signed:  Domenic Polite MD.  [facility] 11/19/2022, 10:28 AM

## 2022-11-22 DIAGNOSIS — F0390 Unspecified dementia without behavioral disturbance: Secondary | ICD-10-CM | POA: Diagnosis not present

## 2022-11-22 DIAGNOSIS — I5033 Acute on chronic diastolic (congestive) heart failure: Secondary | ICD-10-CM | POA: Diagnosis not present

## 2022-11-22 DIAGNOSIS — R339 Retention of urine, unspecified: Secondary | ICD-10-CM | POA: Diagnosis not present

## 2022-11-24 ENCOUNTER — Ambulatory Visit: Payer: Medicare PPO | Admitting: Orthopedic Surgery

## 2022-11-25 DIAGNOSIS — F0390 Unspecified dementia without behavioral disturbance: Secondary | ICD-10-CM | POA: Diagnosis not present

## 2022-11-25 DIAGNOSIS — I5032 Chronic diastolic (congestive) heart failure: Secondary | ICD-10-CM | POA: Diagnosis not present

## 2022-11-25 DIAGNOSIS — I5033 Acute on chronic diastolic (congestive) heart failure: Secondary | ICD-10-CM | POA: Diagnosis not present

## 2022-12-01 DIAGNOSIS — I5033 Acute on chronic diastolic (congestive) heart failure: Secondary | ICD-10-CM | POA: Diagnosis not present

## 2022-12-02 DIAGNOSIS — I5033 Acute on chronic diastolic (congestive) heart failure: Secondary | ICD-10-CM | POA: Diagnosis not present

## 2022-12-03 DIAGNOSIS — I10 Essential (primary) hypertension: Secondary | ICD-10-CM | POA: Diagnosis not present

## 2022-12-03 DIAGNOSIS — F0390 Unspecified dementia without behavioral disturbance: Secondary | ICD-10-CM | POA: Diagnosis not present

## 2022-12-03 DIAGNOSIS — I5033 Acute on chronic diastolic (congestive) heart failure: Secondary | ICD-10-CM | POA: Diagnosis not present

## 2022-12-08 DIAGNOSIS — F0390 Unspecified dementia without behavioral disturbance: Secondary | ICD-10-CM | POA: Diagnosis not present

## 2022-12-08 DIAGNOSIS — I5033 Acute on chronic diastolic (congestive) heart failure: Secondary | ICD-10-CM | POA: Diagnosis not present

## 2022-12-10 DIAGNOSIS — F0390 Unspecified dementia without behavioral disturbance: Secondary | ICD-10-CM | POA: Diagnosis not present

## 2022-12-10 DIAGNOSIS — I10 Essential (primary) hypertension: Secondary | ICD-10-CM | POA: Diagnosis not present

## 2022-12-10 DIAGNOSIS — I5033 Acute on chronic diastolic (congestive) heart failure: Secondary | ICD-10-CM | POA: Diagnosis not present

## 2022-12-20 DIAGNOSIS — I5033 Acute on chronic diastolic (congestive) heart failure: Secondary | ICD-10-CM | POA: Diagnosis not present

## 2022-12-20 DIAGNOSIS — I10 Essential (primary) hypertension: Secondary | ICD-10-CM | POA: Diagnosis not present

## 2022-12-20 DIAGNOSIS — F0390 Unspecified dementia without behavioral disturbance: Secondary | ICD-10-CM | POA: Diagnosis not present

## 2022-12-31 ENCOUNTER — Encounter: Payer: Self-pay | Admitting: Cardiology

## 2023-03-14 DEATH — deceased

## 2023-09-04 IMAGING — CT CT HEAD W/O CM
3 series · 14 of 47 positions shown, 16 images · non-contrast
Comparison: None.

CLINICAL DATA: Mental status change, unknown cause



[Series 2: head wo · axial · 0.47mm/px · z∈[+192,+322]mm · 8 of 32 slices shown, 10 images]
[im 3/32  brain]
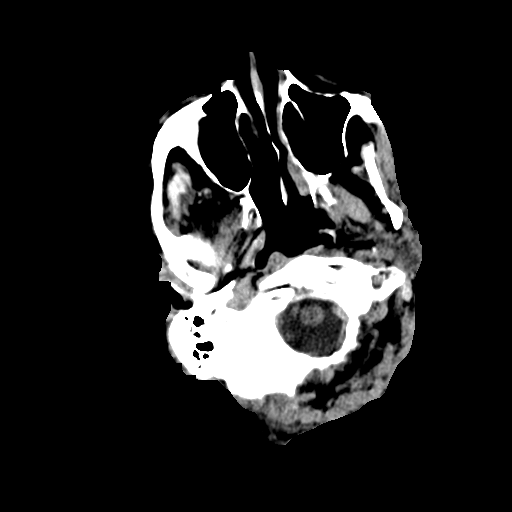
[im 3/32  bone]
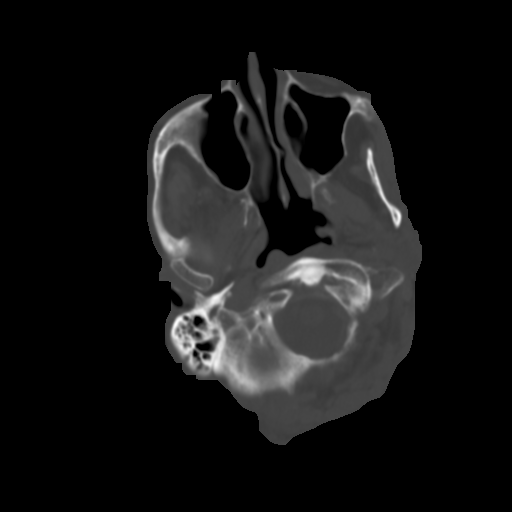
[im 7/32  brain]
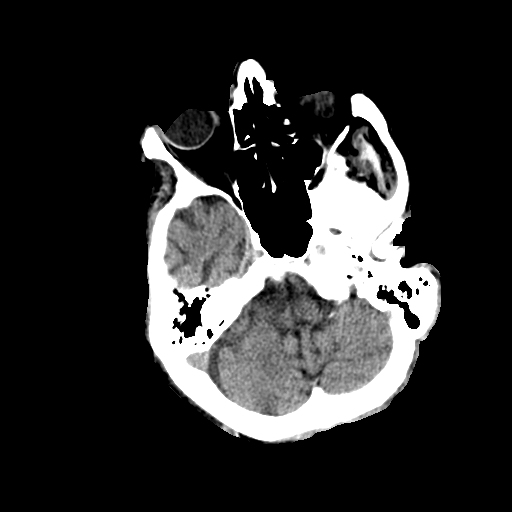
[im 10/32  brain]
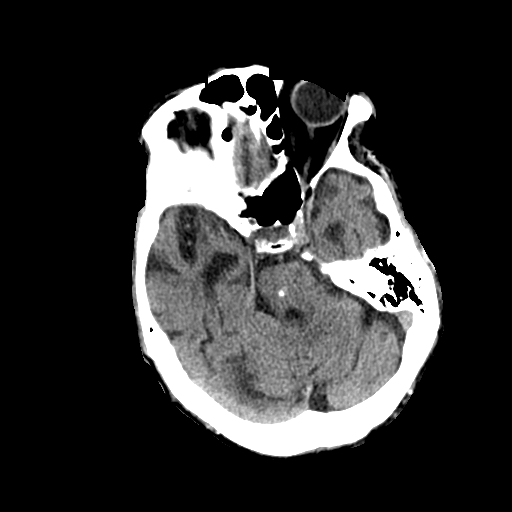
[im 14/32  brain]
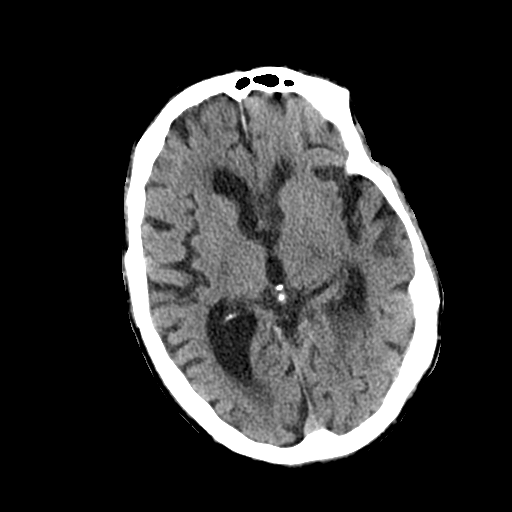
[im 18/32  brain]
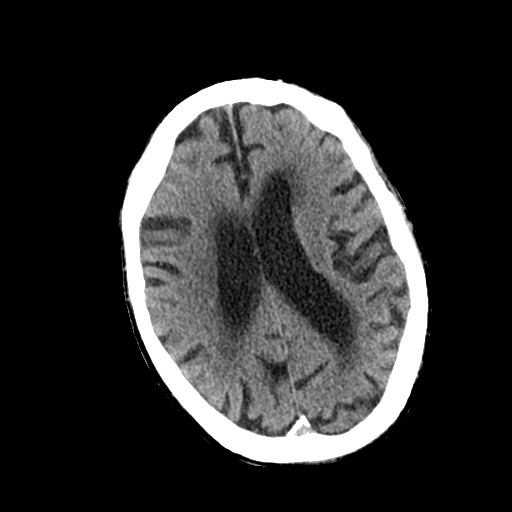
[im 18/32  bone]
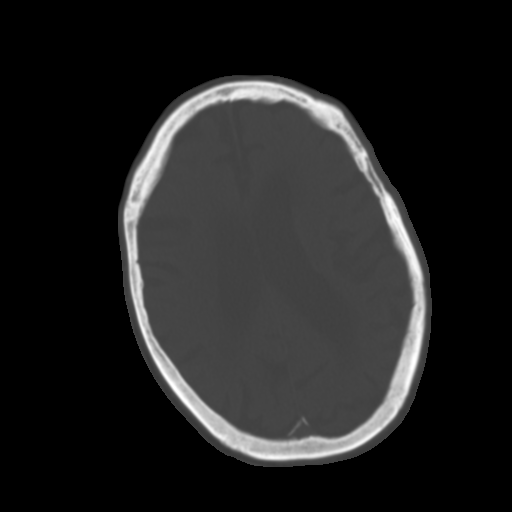
[im 22/32  brain]
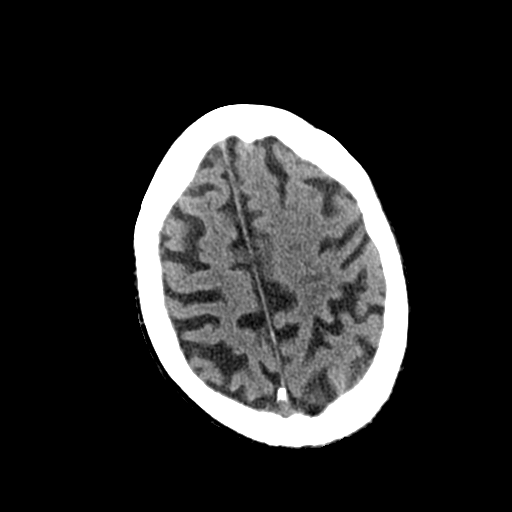
[im 25/32  brain]
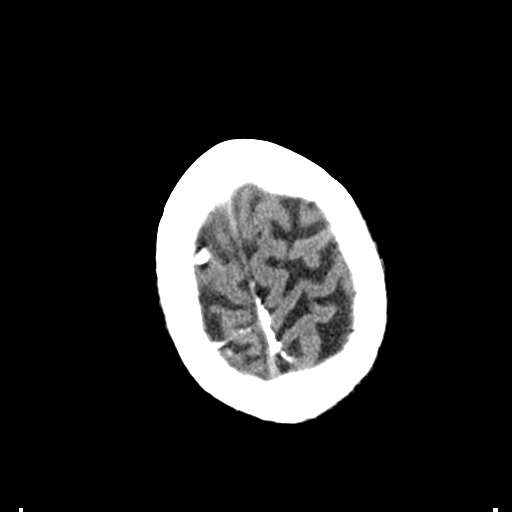
[im 29/32  brain]
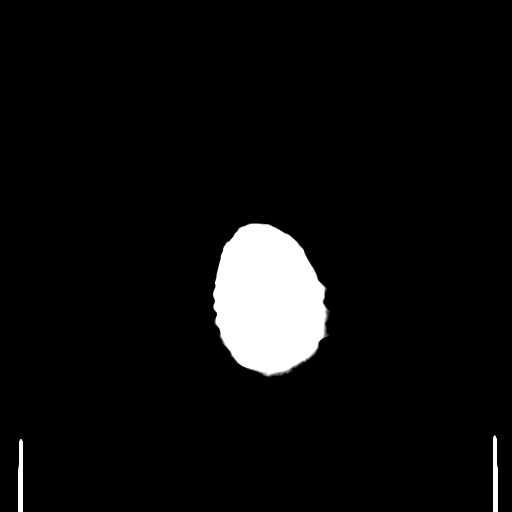

[Series 4: coronal soft tissue · coronal · 0.36mm/px · 3 of 66 slices shown]
[im 22/66  brain]
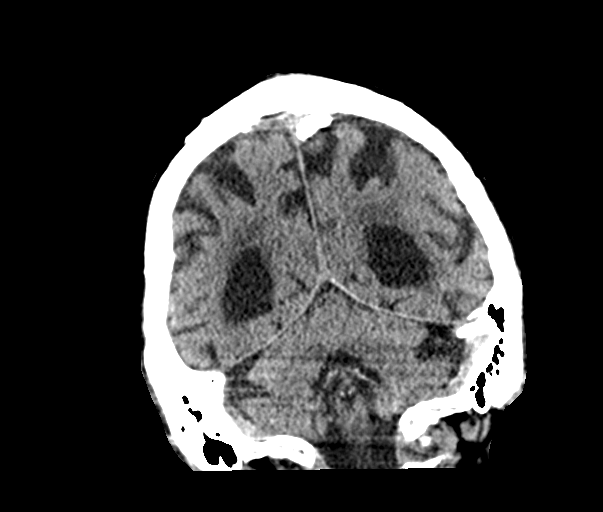
[im 29/66  brain]
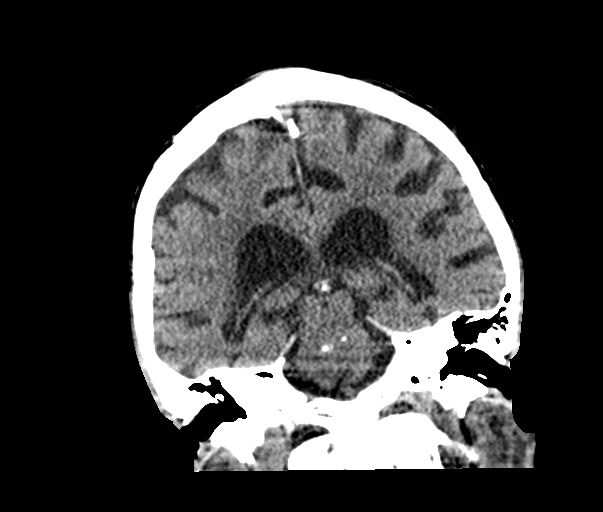
[im 37/66  brain]
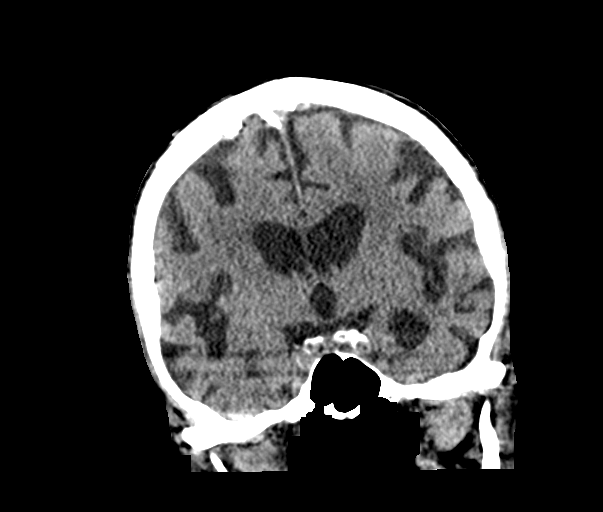

[Series 5: sagittal soft tissue · sagittal · 0.34mm/px · 3 of 51 slices shown]
[im 21/51  brain]
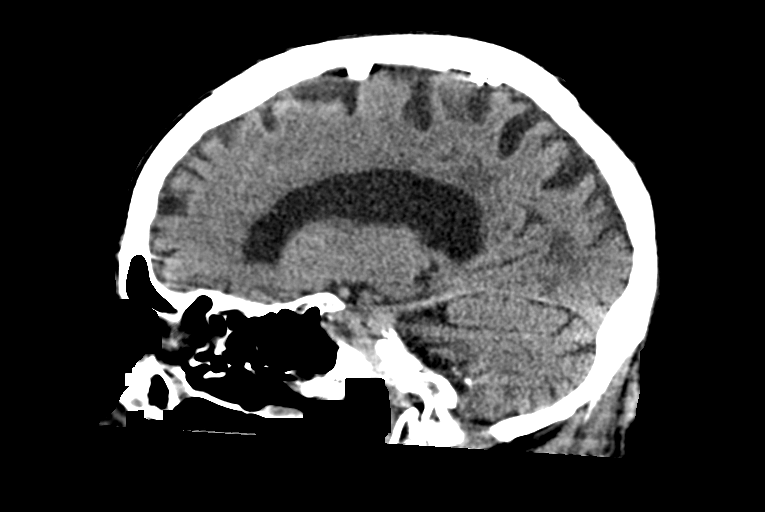
[im 26/51  brain]
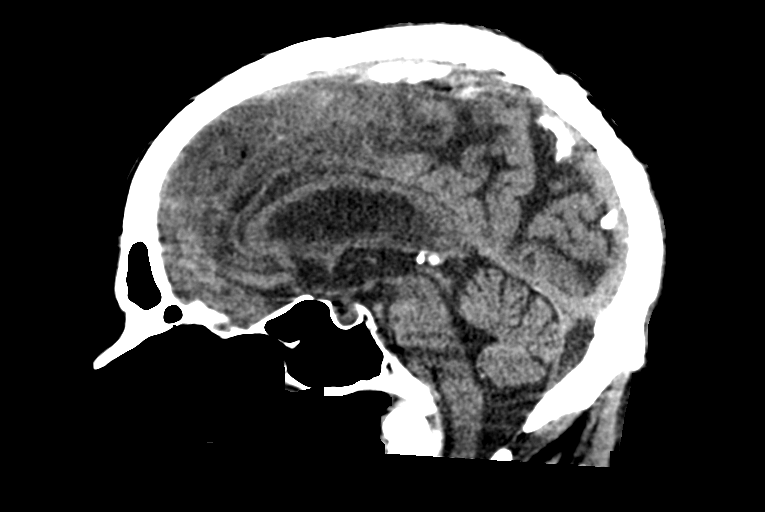
[im 30/51  brain]
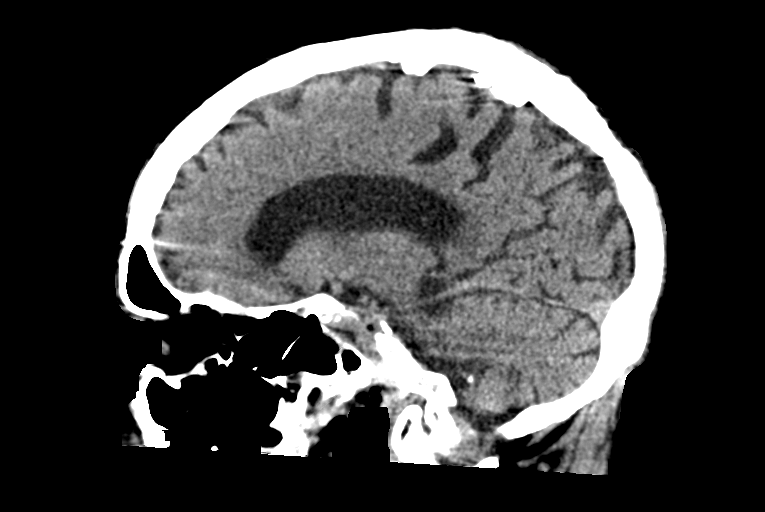

[14 of 47 positions shown; findings below may reference images not displayed]

FINDINGS: Brain: No evidence of acute infarction, hemorrhage, hydrocephalus,
extra-axial collection or mass lesion/mass effect. Generalized
cerebral atrophy. Chronic microvascular ischemic disease.
Nonspecific small calcifications in the pons, probably the sequela
of prior insult.

Vascular: Calcific intracranial atherosclerosis.

Skull: No acute fracture.

Sinuses/Orbits: Clear sinuses.  No acute orbital findings.

Other: No mastoid effusions.
IMPRESSION: 1. No evidence of acute intracranial abnormality.
2.  Cerebral atrophy ([id number]).
3. Chronic microvascular ischemic disease.

## 2023-09-30 IMAGING — DX DG CHEST 1V PORT
1 series · 1 of 1 positions shown · non-contrast
Comparison: 03/25/2022

CLINICAL DATA: Dyspnea, cough

EXAM:
PORTABLE CHEST 1 VIEW

[chest ap]
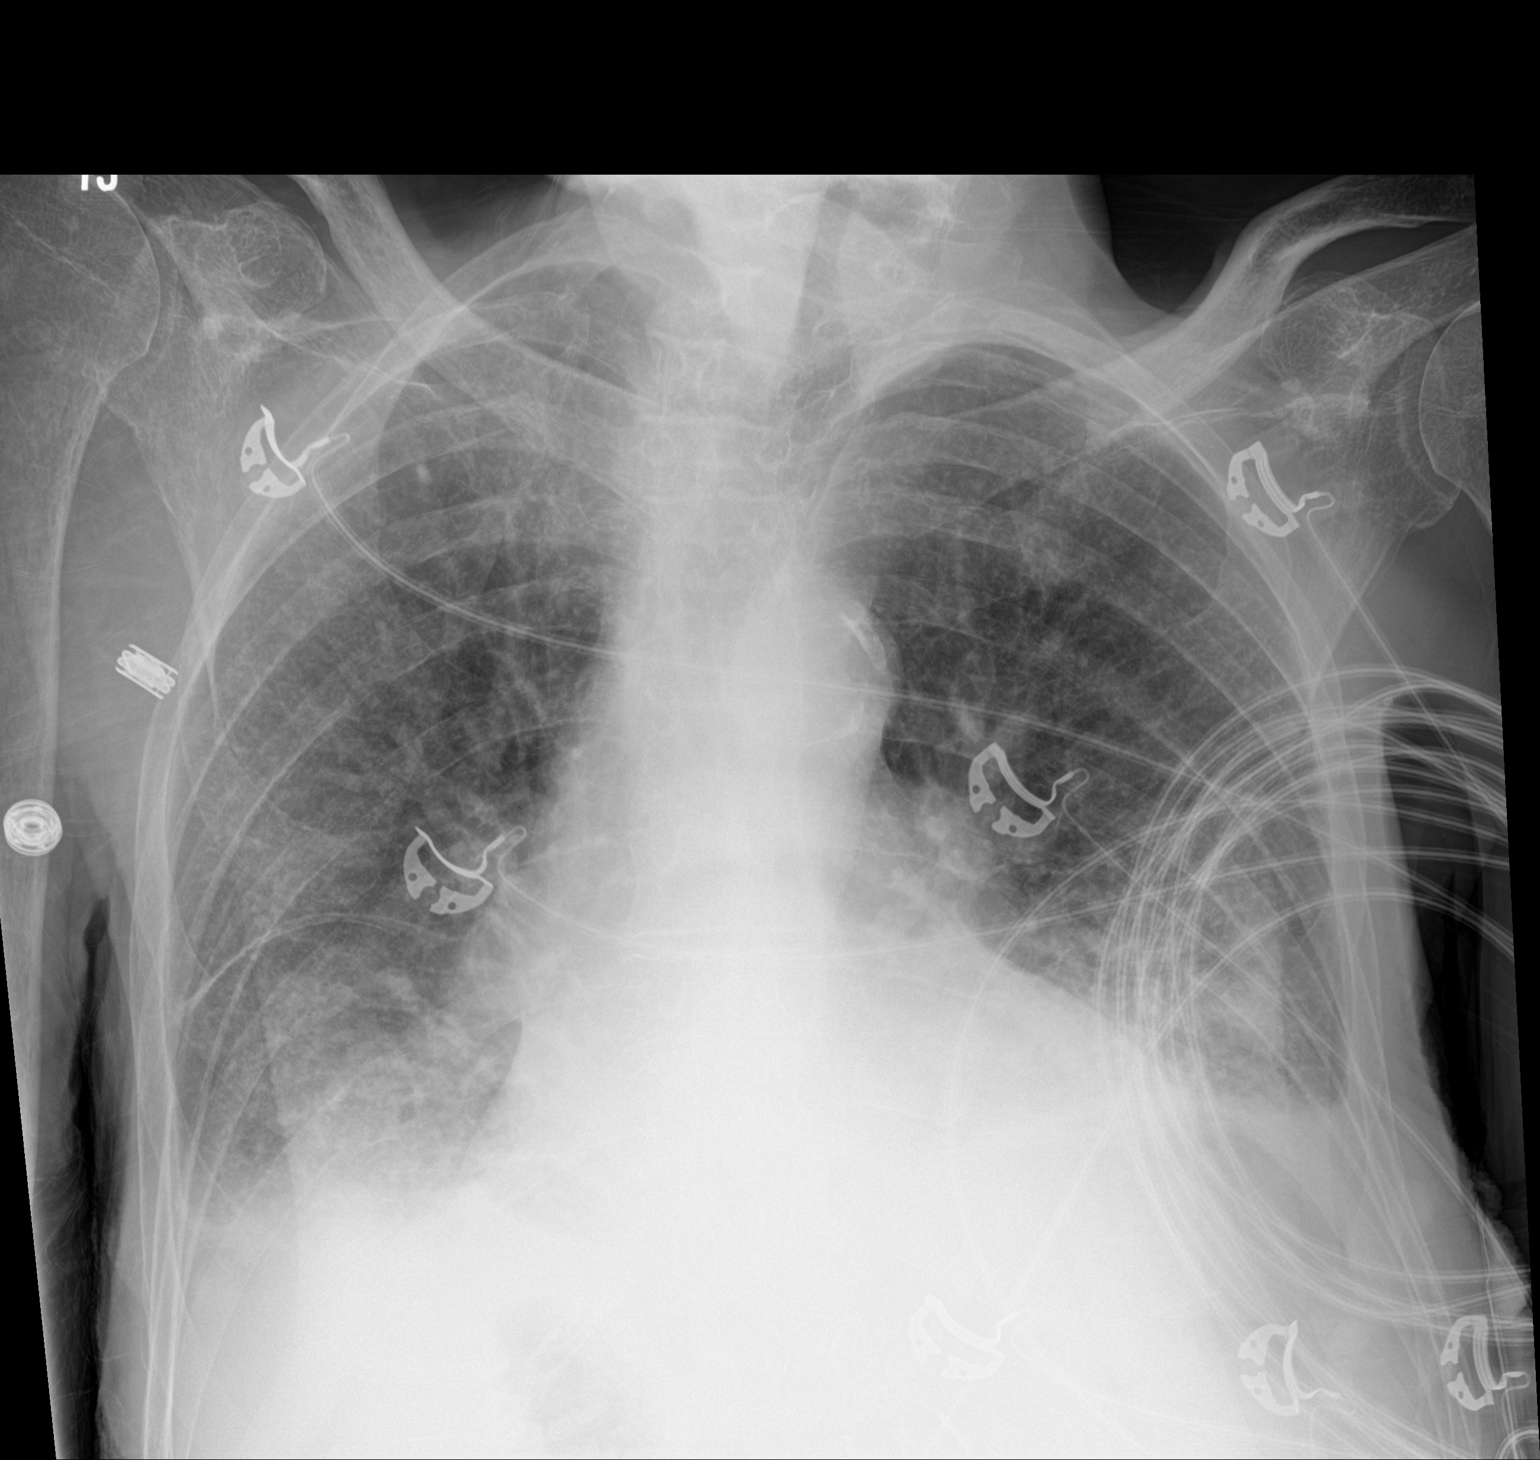

[1 of 1 positions shown; findings below may reference images not displayed]

FINDINGS: Lung volumes are small, but are symmetric and are stable since prior
examination. Small bilateral pleural effusions are present. There is
mild perihilar and lower lung zone predominant interstitial
pulmonary edema, possibly cardiogenic in nature. No pneumothorax.
Cardiac size is at the upper limits of normal, though this is likely
accentuated by semi upright positioning. No acute bone abnormality.
IMPRESSION: Mild cardiogenic failure with mild interstitial pulmonary edema and
small bilateral pleural effusions. This appears progressive since
prior examination.

## 2023-10-04 ENCOUNTER — Other Ambulatory Visit: Payer: Self-pay

## 2023-10-30 IMAGING — DX DG CHEST 2V
2 series · 2 of 2 positions shown · non-contrast
Comparison: 04/20/2022

CLINICAL DATA: Follow-up of pneumonia

EXAM:
CHEST - 2 VIEW

[chest pa]
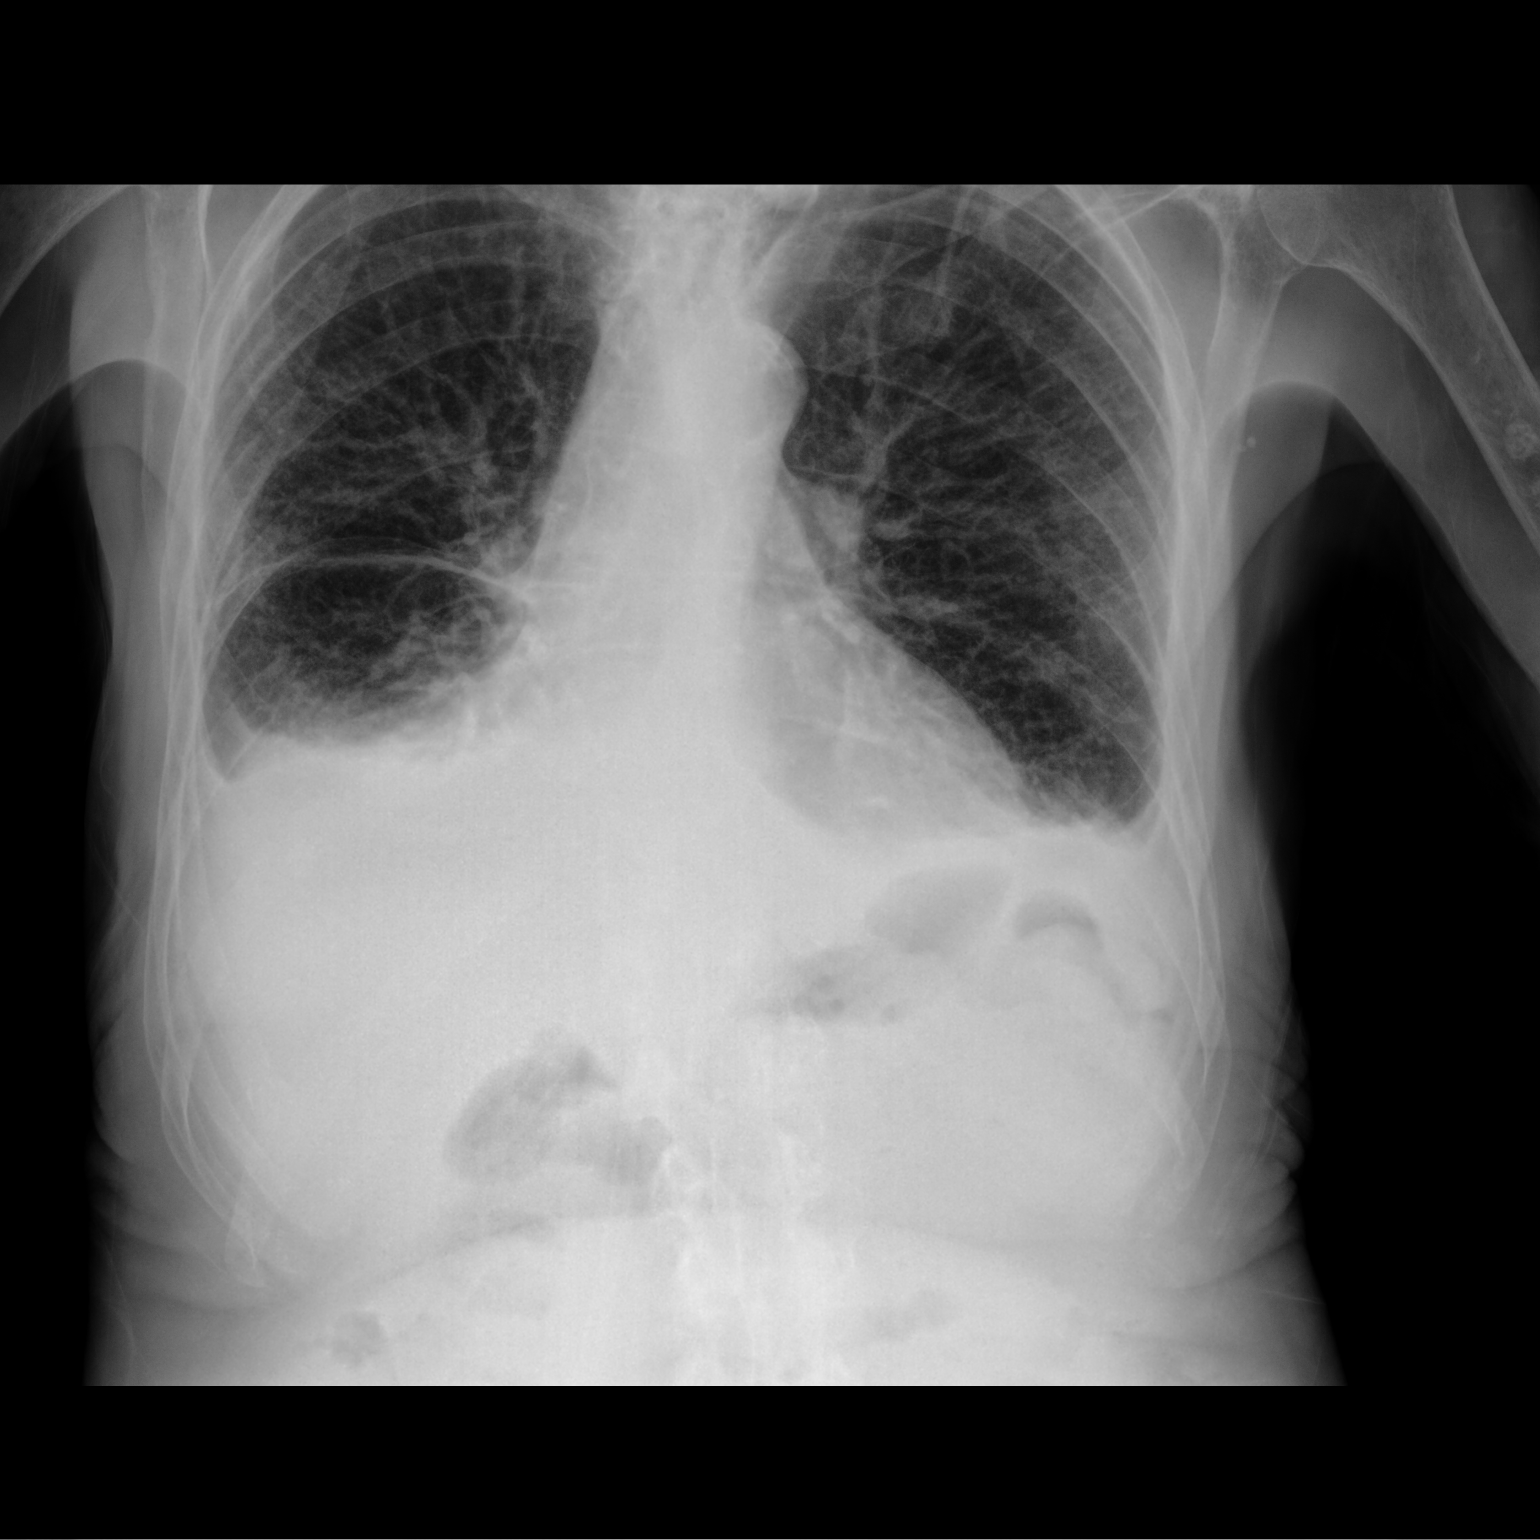

[chest lat]
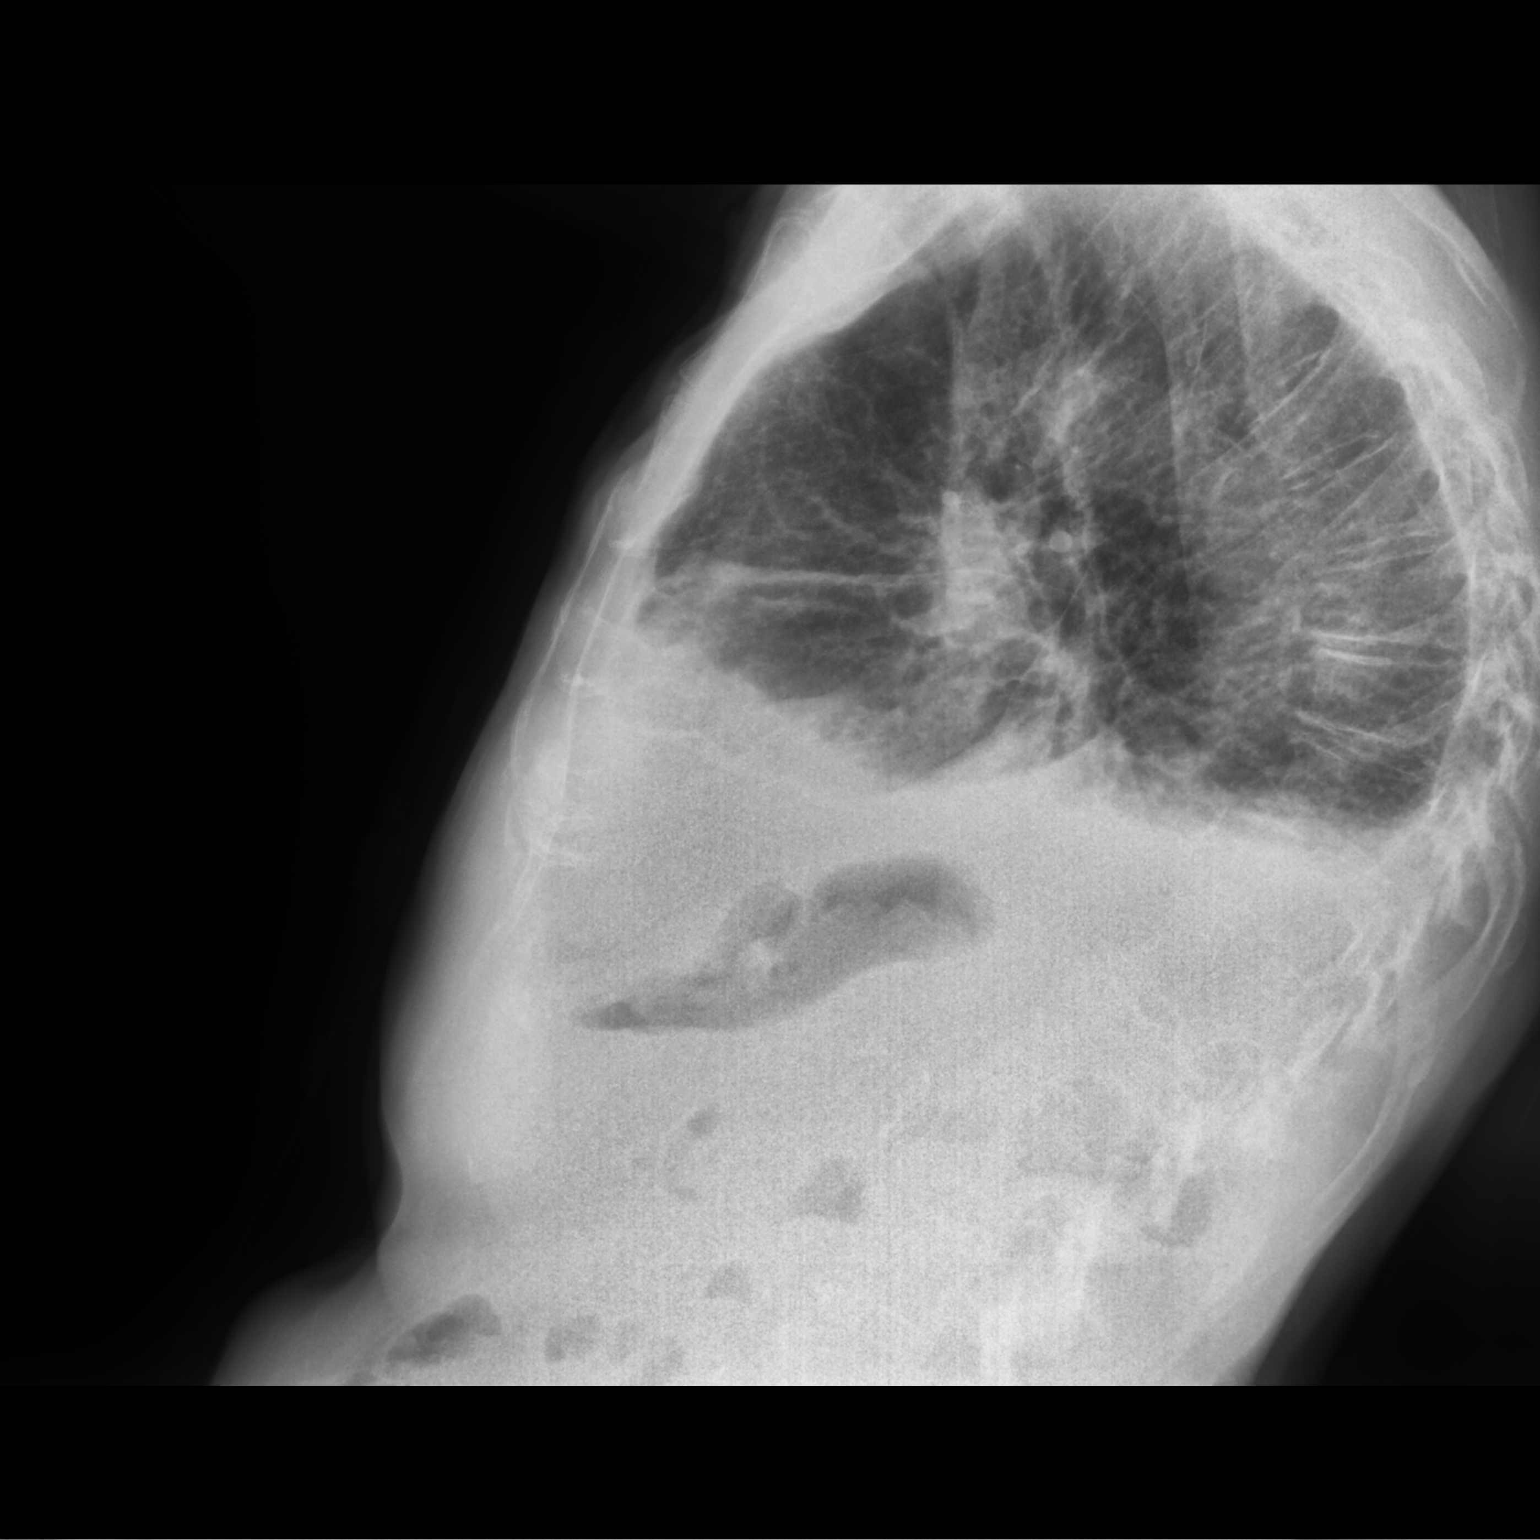

[2 of 2 positions shown; findings below may reference images not displayed]

FINDINGS: Transverse diameter of heart is slightly increased. Central
pulmonary vessels are less prominent. There are no signs of alveolar
pulmonary edema. Increased density in the both lower lung fields may
suggest bilateral pleural effusions and possibly underlying
infiltrates. There is no pneumothorax.
IMPRESSION: There are no signs of pulmonary edema. Small bilateral pleural
effusions more so on the right side. Part of the increased density
in the lower lung fields may be due to underlying
atelectasis/pneumonia.
